# Patient Record
Sex: Male | Born: 1950 | Race: Black or African American | Hispanic: No | Marital: Married | Smoking: Never smoker
Health system: Southern US, Community
[De-identification: ages and names within clinical notes are randomized; demographics above are authoritative.]

## PROBLEM LIST (undated history)

## (undated) DIAGNOSIS — C801 Malignant (primary) neoplasm, unspecified: Secondary | ICD-10-CM

## (undated) DIAGNOSIS — I10 Essential (primary) hypertension: Secondary | ICD-10-CM

## (undated) DIAGNOSIS — Z8489 Family history of other specified conditions: Secondary | ICD-10-CM

## (undated) DIAGNOSIS — C20 Malignant neoplasm of rectum: Secondary | ICD-10-CM

## (undated) HISTORY — PX: ANKLE SURGERY: SHX546

## (undated) HISTORY — PX: HERNIA REPAIR: SHX51

## (undated) MED FILL — Dexamethasone Sodium Phosphate Inj 100 MG/10ML: INTRAMUSCULAR | Qty: 1 | Status: AC

---

## 2019-09-20 HISTORY — PX: RECTAL BIOPSY: SHX2303

## 2020-08-11 ENCOUNTER — Other Ambulatory Visit (HOSPITAL_COMMUNITY): Payer: Self-pay | Admitting: General Surgery

## 2020-08-11 ENCOUNTER — Other Ambulatory Visit: Payer: Self-pay | Admitting: General Surgery

## 2020-08-11 DIAGNOSIS — K6289 Other specified diseases of anus and rectum: Secondary | ICD-10-CM

## 2020-08-11 DIAGNOSIS — C20 Malignant neoplasm of rectum: Secondary | ICD-10-CM

## 2020-08-11 NOTE — Progress Notes (Signed)
Spoke with patient regarding referral we received from Dr. Marcello Moores at [facility] for newly diagnoised anal adenocarcinoma.  He is having CT and MRI done on Monday 11/29 and I have scheduled him with Dr. Benay Spice on Thursday 08/20/2020 at 2 pm to arrive no later than 1:45 for registration.  I have given him our physical address and informed him he may bring one person with him for his consult.  He verbalized an understanding.

## 2020-08-12 ENCOUNTER — Telehealth: Payer: Self-pay | Admitting: Radiation Oncology

## 2020-08-12 NOTE — Telephone Encounter (Signed)
Shay faxed a request to [facility] yesterday asking for path/imaging records. I have followed up with a voicemail to [facility] to get these records.

## 2020-08-17 ENCOUNTER — Ambulatory Visit (HOSPITAL_COMMUNITY)
Admission: RE | Admit: 2020-08-17 | Discharge: 2020-08-17 | Disposition: A | Discharge status: Home / Self Care | Payer: Medicare Other | Source: Ambulatory Visit | Attending: General Surgery | Admitting: General Surgery

## 2020-08-17 ENCOUNTER — Encounter (HOSPITAL_COMMUNITY): Payer: Self-pay

## 2020-08-17 ENCOUNTER — Other Ambulatory Visit: Payer: Self-pay

## 2020-08-17 ENCOUNTER — Other Ambulatory Visit: Payer: Self-pay | Admitting: Nephrology

## 2020-08-17 ENCOUNTER — Other Ambulatory Visit (HOSPITAL_COMMUNITY): Payer: Self-pay | Admitting: General Surgery

## 2020-08-17 DIAGNOSIS — K6289 Other specified diseases of anus and rectum: Secondary | ICD-10-CM | POA: Diagnosis present

## 2020-08-17 DIAGNOSIS — C20 Malignant neoplasm of rectum: Secondary | ICD-10-CM | POA: Diagnosis present

## 2020-08-17 DIAGNOSIS — N1832 Chronic kidney disease, stage 3b: Secondary | ICD-10-CM

## 2020-08-17 HISTORY — DX: Essential (primary) hypertension: I10

## 2020-08-17 HISTORY — DX: Malignant (primary) neoplasm, unspecified: C80.1

## 2020-08-17 HISTORY — DX: Malignant neoplasm of rectum: C20

## 2020-08-17 LAB — POCT I-STAT CREATININE: Creatinine, Ser: 2.7 mg/dL — ABNORMAL HIGH (ref 0.61–1.24)

## 2020-08-17 NOTE — Progress Notes (Signed)
Pt too claustrophobic for MRI of the pelvis. Pt is too reach out to his physician and see about getting meds to reschedule MRI if warrented. Pt going for his CT after he left the department. Pt verbalized understanding of the next steps to be taken to be able to attempt mri once more.

## 2020-08-20 ENCOUNTER — Telehealth: Payer: Self-pay | Admitting: Oncology

## 2020-08-20 ENCOUNTER — Other Ambulatory Visit: Payer: Self-pay

## 2020-08-20 ENCOUNTER — Inpatient Hospital Stay: Payer: Medicare Other | Attending: Oncology | Admitting: Oncology

## 2020-08-20 VITALS — BP 156/79 | HR 99 | Temp 96.9°F | Resp 18 | Ht 69.0 in | Wt 204.2 lb

## 2020-08-20 DIAGNOSIS — C20 Malignant neoplasm of rectum: Secondary | ICD-10-CM | POA: Diagnosis not present

## 2020-08-20 DIAGNOSIS — G4730 Sleep apnea, unspecified: Secondary | ICD-10-CM | POA: Insufficient documentation

## 2020-08-20 DIAGNOSIS — N289 Disorder of kidney and ureter, unspecified: Secondary | ICD-10-CM | POA: Insufficient documentation

## 2020-08-20 DIAGNOSIS — K439 Ventral hernia without obstruction or gangrene: Secondary | ICD-10-CM | POA: Insufficient documentation

## 2020-08-20 DIAGNOSIS — Z86010 Personal history of colonic polyps: Secondary | ICD-10-CM | POA: Insufficient documentation

## 2020-08-20 DIAGNOSIS — Z87442 Personal history of urinary calculi: Secondary | ICD-10-CM | POA: Insufficient documentation

## 2020-08-20 DIAGNOSIS — R911 Solitary pulmonary nodule: Secondary | ICD-10-CM | POA: Insufficient documentation

## 2020-08-20 DIAGNOSIS — K7689 Other specified diseases of liver: Secondary | ICD-10-CM | POA: Insufficient documentation

## 2020-08-20 DIAGNOSIS — Z5111 Encounter for antineoplastic chemotherapy: Secondary | ICD-10-CM | POA: Insufficient documentation

## 2020-08-20 DIAGNOSIS — Z8547 Personal history of malignant neoplasm of testis: Secondary | ICD-10-CM | POA: Insufficient documentation

## 2020-08-20 DIAGNOSIS — Z923 Personal history of irradiation: Secondary | ICD-10-CM | POA: Insufficient documentation

## 2020-08-20 DIAGNOSIS — I10 Essential (primary) hypertension: Secondary | ICD-10-CM | POA: Insufficient documentation

## 2020-08-20 NOTE — Progress Notes (Signed)
[facility] New Patient Consult   Requesting MD: Leighton Ruff, Md [address] [place],  [place] [address]   Walter [name] 69 y.o.  April 22, 1951    Reason for Consult: Rectal cancer   HPI: Walter [name] reports rectal bleeding for the past year.  He was referred to Dr. Alan Ripper at [facility] and underwent a colonoscopy on 06/29/2020.  "Nodularity "of the rectum was noted on digital exam.  Polyps were removed from the colon and rectum.  The pathology revealed benign colonic mucosa at the cecum polypectomy, a benign serrated polyp at the cecum, a tubular adenoma of the ascending colon, a tubular adenoma at the descending colon, and a tubulovillous adenoma with high-grade dysplasia involving the rectal polypectomy.  Margin involvement could not be excluded at the rectal polypectomy.  He was referred to Dr. Marcello Moores.  A 75% circumferential perianal mass with involvement of the left posterior anal canal and fixed to underlying tissue was noted.  A biopsy was performed.  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  The tumor is positive for CDX2 and cytokeratin 20.  CTs of the chest, abdomen, and pelvis on 08/17/2020 revealed a ground glass left upper lobe nodule and a 3 mm right middle lobe nodule.  There are liver cysts and too small to characterize liver lesions.  Left renal atrophy.  Ventral abdominal hernia containing colon.  Rectal mass not identified.  No abdominal pelvic adenopathy.    Past Medical History:  Diagnosis Date  . Hypertension   . 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 testicular ca-left    XRT comp    .  Sleep apnea   .  History of kidney stones  Past surgical history:   .  Umbilical hernia repair   .  Left orchiectomy Medications: Reviewed  Allergies: Not on File  Family history: His mother had "throat "cancer.  A sister had stomach cancer.  His brother had "cancer ".  Social History:   He  lives with his wife in [place].  He has 5 children.  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  No transfusion history.  No risk factor for HIV or hepatitis.  ROS:   Positives include: Intermittent rectal bleeding, "small "bowel movements, discomfort at the rectum, palpable "nodule "at the rectum  A complete ROS was otherwise negative.  Physical Exam:  Blood pressure (!) 156/79, pulse 99, temperature (!) 96.9 F (36.1 C), temperature source Tympanic, resp. rate 18, height 5\' 9"  (1.753 m), weight 204 lb 3.2 oz (92.6 kg), SpO2 99 %.  HEENT: Neck without mass Lungs: Clear bilaterally Cardiac: Regular rate and rhythm Abdomen: No hepatosplenomegaly, ventral hernia, nontender, no mass GU: Right testicle without mass Vascular: No leg edema Lymph nodes: No cervical, supraclavicular, axillary, or inguinal nodes Neurologic: Alert and oriented, the motor exam appears intact in the upper and lower extremities bilaterally Skin: No rash Musculoskeletal: No spine tenderness  Rectal: Firm tumor at the anal verge extending proximally to the end of the examination finger, centered to the left of midline but nearly completely circumferential   LAB:  CBC: 04/27/2020 Hemoglobin 12.8, platelets 258,000, white count 6.5, platelets 258,000, 50.9% neutrophils     CMP  Lab Results  Component Value Date   CREATININE 2.70 (H) 08/17/2020   04/27/2020: BUN 22, creatinine 2.2, albumin 3.5, bilirubin 0.409, alkaline phosphatase 74, AST 17, ALT 15    Imaging:  As per HPI  Assessment/Plan:   1. Rectal cancer  Rectal nodularity noted on colonoscopy 06/29/2020, removal of a rectal "polyp "revealed a tubulovillous adenoma with high-grade dysplasia  Physical exam 08/04/2020-perianal mass invading the anal canal and sphincter complex-biopsy positive for mucinous adenocarcinoma  CTs 08/17/2020-no rectal mass identified, multiple liver cyst and too small to characterize  low-attenuation liver lesions, midline ventral hernia containing proximal right colon, groundglass left upper lobe nodule and nonspecific 3 mm right middle lobe nodule 2. Renal insufficiency 3. 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 Hypertension 5. History of kidney stones 6. Sleep apnea 7. Umbilical hernia repair 8. Multiple polyps removed on colonoscopy 06/29/2020 including a benign serrated polyp, tubular adenomas, and a tubulovillous adenoma with high-grade dysplasia at the rectum   Disposition:   Walter [name] has been diagnosed with rectal cancer.  He appears to have tumor involving the rectum, anal canal, and anal margin.  Staging CT scans revealed no clear evidence of metastatic disease.  I discussed treatment options with Walter [name].  He understands surgery will require an APR and permanent colostomy.  He is scheduled for a staging pelvic MRI on 08/25/2020.  He will return for an office visit and further discussion on 08/27/2020.  We will follow up on records regarding the history of testicular cancer.  He reports receiving radiation which may impact on his ability to receive neoadjuvant radiation.  He is scheduled to see Dr. Lisbeth Renshaw on 08/26/2020.  His case will be presented at the GI tumor conference.  I anticipate we will recommend neoadjuvant infusional 5-fluorouracil and radiation if the MRI does not confirm node positive disease.  Betsy Coder, MD  08/20/2020, 2:27 PM

## 2020-08-20 NOTE — Telephone Encounter (Signed)
Scheduled appointment per 12/2 los. Spoke to patient who is aware of appointment date and time. Gave patient calendar print out.  °

## 2020-08-21 ENCOUNTER — Encounter: Payer: Self-pay | Admitting: *Deleted

## 2020-08-21 NOTE — Progress Notes (Signed)
Met with patient and his wife prior to their initial medical oncology consult with Dr. Brad Sherrill.  I explained my role as nurse navigator and they were given my card with my direct contact information.  Per Dr. Sherrill's request I had patient sign a medical release of information and faxed this to [facility] in [place], [place] in attempt to obtain previous records related to his testicular cancer approximately 20 years ago including dosimetry records.  I received a fax back from them stating their records only go back 10 years.  I have made Dr. Sherrill and RT aware.  

## 2020-08-21 NOTE — Progress Notes (Signed)
Per Dr. Benay Spice request: Email to [facility] requesting assistance in getting IHC testing done on [place] case #[id number] dated 08/04/20 (anal biopsy).

## 2020-08-24 ENCOUNTER — Ambulatory Visit
Admission: RE | Admit: 2020-08-24 | Discharge: 2020-08-24 | Disposition: A | Discharge status: Home / Self Care | Payer: Medicare Other | Source: Ambulatory Visit | Attending: General Surgery | Admitting: General Surgery

## 2020-08-24 ENCOUNTER — Other Ambulatory Visit: Payer: Self-pay

## 2020-08-24 DIAGNOSIS — K6289 Other specified diseases of anus and rectum: Secondary | ICD-10-CM

## 2020-08-24 DIAGNOSIS — C20 Malignant neoplasm of rectum: Secondary | ICD-10-CM

## 2020-08-25 ENCOUNTER — Other Ambulatory Visit: Payer: Medicare Other

## 2020-08-25 NOTE — Progress Notes (Signed)
GI Location of Tumor / Histology: Rectal Cancer- Adenocarcinoma  Walter [name] presented with rectal bleeding.  CT CAP 08/17/2020: Ground glass left upper lobe nodule and a 3 mm right middle lobe nodule.  There are liver cysts and too small to characterize liver lesions.  Left renal atrophy.  Ventral abdominal hernia containing colon.  Rectal mass not identified.  No abdominal pelvic adenopathy.  Colonoscopy 06/29/2020:   Biopsies of Rectal Mass    Past/Anticipated interventions by surgeon, if any:  Dr. Marcello Moores 08/04/2020 -On exam today, this appears to be malignant in nature with an infiltrative mass involving approximately 75% of the perianal region and invading into the anal canal and sphincter complex. -I performed biopsy today under local anesthesia.  Differential diagnosis includes Paget's Disease and anal squamous cell carcinoma. 08/10/2020 -Surgical pathologist called and stated punch biopsy appears to be rectal adenocarcinoma. -We will notify the patient and refer to radiation and oncology for treatment.   Past/Anticipated interventions by medical oncology, if any:  Dr. Benay Spice 08/20/2020 -Walter [name] has been diagnosed with rectal cancer.  He appears to have tumor involving the rectum, anal canal, and anal margin.  Staging CT scans revealed no clear evidence of metastatic disease. -I discussed treatment options with Walter [name].  He understands surgery will require an APR and permanent colostomy.  He is scheduled for a staging pelvic MRI on 08/25/2020.  He will return for an office visit and further discussion on 08/27/2020. -Follow-up appointment 08/27/2020 1045   Weight changes, if any: No  Bowel/Bladder complaints, if any: Passing small nuggets, no real constipation noted.  He has baseline urinary frequency.  Nausea / Vomiting, if any: No  Pain issues, if any: Has some pain with stools.  Any blood per rectum: Has occasional, typically rust color.    SAFETY  ISSUES:  Prior radiation? Left Orchiectomy and radiation for testicular Cancer 1990's, in [place]  Pacemaker/ICD? No  Possible current pregnancy? n/a  Is the patient on methotrexate? No  Current Complaints/Details:

## 2020-08-26 ENCOUNTER — Ambulatory Visit
Admission: RE | Admit: 2020-08-26 | Discharge: 2020-08-26 | Disposition: A | Discharge status: Home / Self Care | Payer: Medicare Other | Source: Ambulatory Visit | Attending: Radiation Oncology | Admitting: Radiation Oncology

## 2020-08-26 ENCOUNTER — Encounter: Payer: Self-pay | Admitting: Radiation Oncology

## 2020-08-26 ENCOUNTER — Other Ambulatory Visit: Payer: Self-pay

## 2020-08-26 ENCOUNTER — Encounter: Payer: Self-pay | Admitting: General Practice

## 2020-08-26 VITALS — BP 153/78 | HR 88 | Temp 97.9°F | Resp 19 | Ht 69.0 in | Wt 204.4 lb

## 2020-08-26 DIAGNOSIS — C20 Malignant neoplasm of rectum: Secondary | ICD-10-CM

## 2020-08-26 DIAGNOSIS — R911 Solitary pulmonary nodule: Secondary | ICD-10-CM | POA: Insufficient documentation

## 2020-08-26 DIAGNOSIS — Z8547 Personal history of malignant neoplasm of testis: Secondary | ICD-10-CM | POA: Diagnosis not present

## 2020-08-26 DIAGNOSIS — I10 Essential (primary) hypertension: Secondary | ICD-10-CM | POA: Diagnosis not present

## 2020-08-26 DIAGNOSIS — Z7982 Long term (current) use of aspirin: Secondary | ICD-10-CM | POA: Diagnosis not present

## 2020-08-26 DIAGNOSIS — K439 Ventral hernia without obstruction or gangrene: Secondary | ICD-10-CM | POA: Diagnosis not present

## 2020-08-26 DIAGNOSIS — N400 Enlarged prostate without lower urinary tract symptoms: Secondary | ICD-10-CM | POA: Insufficient documentation

## 2020-08-26 DIAGNOSIS — Z79899 Other long term (current) drug therapy: Secondary | ICD-10-CM | POA: Insufficient documentation

## 2020-08-26 DIAGNOSIS — I700 Atherosclerosis of aorta: Secondary | ICD-10-CM | POA: Insufficient documentation

## 2020-08-26 DIAGNOSIS — K7689 Other specified diseases of liver: Secondary | ICD-10-CM | POA: Diagnosis not present

## 2020-08-26 DIAGNOSIS — Z923 Personal history of irradiation: Secondary | ICD-10-CM | POA: Insufficient documentation

## 2020-08-26 NOTE — Progress Notes (Signed)
[facility] Clinical Social Work  Initial Assessment   Walter [name] is a 69 y.o. year old male contacted by phone.. Clinical Social Work was referred by Distress Screen for assessment of psychosocial needs.   SDOH (Social Determinants of Health) assessments performed: Yes SDOH Interventions     Most Recent Value  SDOH Interventions  Food Insecurity Interventions Intervention Not Indicated  Financial Strain Interventions Intervention Not Indicated  Housing Interventions Intervention Not Indicated  Social Connections Interventions Intervention Not Indicated  Transportation Interventions Intervention Not Indicated      Distress Screen completed: Yes ONCBCN DISTRESS SCREENING 08/26/2020  Screening Type Initial Screening  Distress experienced in past week (1-10) 8  Emotional problem type Nervousness/Anxiety  Information Concerns Type Lack of info about diagnosis;Lack of info about treatment  Referral to clinical psychology -  Referral to clinical social work -  Referral to dietition -  Referral to financial advocate -  Referral to support programs -  Referral to palliative care -  Other Contact via phone      Family/Social Information:  . Housing Arrangement: patient lives with wife, moved to this area in June 2021 . Family members/support persons in your life? Family all live in [place], son lives here but he travels quite a bit.  Somewhat isolated, has not established connections with church or community groups like he had in [place] . Transportation concerns: No, wife will drive . Employment: Retired.  . Income source: retirement income . Financial concerns: No o Type of concern: None . Food access concerns: No . Religious or spiritual practice: Retired Theme park manager, looking to join [employer] but has not found right place yet. . Medication Concerns: No . Services Currently in place:  none  Coping/ Adjustment to diagnosis: . Patient understands treatment plan and what happens next?  Newly diagnosed with rectal cancer after having irregularities in his colonoscopy.  History of testicular cancer in the past.  Will have radiation and chemotherapy.   . Concerns about diagnosis and/or treatment: I'm not especially worried about anything . Patient reported stressors: new to this area, does not have much support system here but is in frequent contact w relatives in [place] . Hopes and priorities: finishing treatment . Current coping skills/ strengths: Ability for insight, Average or above average intelligence, Capable of independent living, Communication skills, Financial means, General fund of knowledge, Religious Affiliation and Supportive family/friends    SUMMARY: Current SDOH Barriers:  . None  Interventions: . Discussed common feeling and emotions when being diagnosed with cancer, and the importance of support during treatment . Informed patient of the support team roles and support services at [facility] . Provided CSW contact information and encouraged patient to call with any questions or concerns . Provided patient with information about [place] and services available to him/spouse   Follow Up Plan: Patient will contact CSW with any support or resource needs Patient verbalizes understanding of plan: Yes    Beverely Pace , LCSW

## 2020-08-26 NOTE — Progress Notes (Signed)
Radiation Oncology         [phone] ________________________________  Name: Walter [name]        MRN: [id number]  Date of Service: 08/26/2020 DOB: 14-Dec-1950  BT:DHRCBUL, Provider, MD  Leighton Ruff, MD     REFERRING PHYSICIAN: Leighton Ruff, MD   DIAGNOSIS: The encounter diagnosis was Rectal adenocarcinoma [facility]).   HISTORY OF PRESENT ILLNESS: Walter [name] is a 69 y.o. male seen at the request of Dr.Sherril for a newly diagnosed carcinoma of the rectum. The patient was experiencing rectal bleeding intermittently for the last year, he met with his provider at [facility] and underwent a colonoscopy on 06/29/2020. The notes indicate that there was a nodularity of the rectum by digital examination as well as multiple polyps, cecal polypectomy showed a benign serrated polyp, tubular adenoma was seen in the ascending colon and descending colon as well as a tubular visible villous adenoma with high-grade dysplasia involving the rectal polypectomy margin involved could not be excluded by rectal polypectomy and he met with Dr. Marcello Moores. By exam a 75% circumferential perianal mass with involvement of the left posterior anal canal was noted and there was fixed appearing changes of the underlying tissue to the mass. 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 He also underwent a CT chest abdomen pelvis on 08/17/2020 which revealed no specific findings to suggest metastatic or nodal disease in the chest abdomen and pelvis, multiple lobe liver cysts, a large abdominal wall hernia containing the proximal right colon, and a 7 mm left upper lobe nodule and right middle lobe nodule measuring 3 mm were all identified along with stigmata of atherosclerotic disease. He underwent attempt at MRI of the pelvis but could not proceed with this due to severe claustrophobia. He's seen today to discuss options of treatment for his cancer. Dr. Learta Codding anticipates 5FU  chemoRT. 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     PREVIOUS RADIATION THERAPY: Yes   May 1991: It appears from handwritten notes that in [place] he received 27 Gy in 15 fractions to the pelvic and paraaortic lymph nodes.   PAST MEDICAL HISTORY:  Past Medical History:  Diagnosis Date  . Hypertension   . Rectal cancer ([facility] [place]) dx'd 07/2020  . testicular ca    XRT comp       PAST SURGICAL HISTORY: Past Surgical History:  Procedure Laterality Date  . RECTAL BIOPSY  2021     FAMILY HISTORY: No family history on file.   SOCIAL HISTORY:  The patient is married and lives in [place]. He's originally from [place]. He and his wife moved here to be closer to family. 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   ALLERGIES: Patient has no allergy information on record.   MEDICATIONS:  Current Outpatient Medications  Medication Sig Dispense Refill  . amLODipine (NORVASC) 10 MG tablet Take 10 mg by mouth daily.    Marland Kitchen aspirin 81 MG EC tablet Take 81 mg by mouth once a week.    . chlorthalidone (HYGROTON) 25 MG tablet Take 25 mg by mouth daily.    . cyanocobalamin 1000 MCG tablet Take 1,000 mcg by mouth daily.    . Multiple Vitamin (MULTIVITAMIN) tablet Take 1 tablet by mouth daily.     No current facility-administered medications for this encounter.     REVIEW OF  SYSTEMS: On review of systems, the patient reports that he is doing well overall. He reports occasional twinges of pain in his rectum as well as firm small stool that passes. He denies any bleeding currently with bowel movements. He denies any chest pain, shortness of breath, cough, fevers, chills, night sweats, unintended weight changes, or bladder disturbances, and denies abdominal pain, nausea or vomiting. No other complaints are noted.     PHYSICAL EXAM:  Wt Readings from Last 3  Encounters:  08/26/20 204 lb 6 oz (92.7 kg)  08/20/20 204 lb 3.2 oz (92.6 kg)   Temp Readings from Last 3 Encounters:  08/26/20 97.9 F (36.6 C) (Temporal)  08/20/20 (!) 96.9 F (36.1 C) (Tympanic)   BP Readings from Last 3 Encounters:  08/26/20 (!) 153/78  08/20/20 (!) 156/79   Pulse Readings from Last 3 Encounters:  08/26/20 88  08/20/20 99   Pain Assessment Pain Score: 0-No pain/10  In general this is a well appearing African American male in no acute distress. He's alert and oriented x4 and appropriate throughout the examination. Cardiopulmonary assessment is negative for acute distress and he exhibits normal effort.     ECOG = 1  0 - Asymptomatic (Fully active, able to carry on all predisease activities without restriction)  1 - Symptomatic but completely ambulatory (Restricted in physically strenuous activity but ambulatory and able to carry out work of a light or sedentary nature. For example, light housework, office work)  2 - Symptomatic, <50% in bed during the day (Ambulatory and capable of all self care but unable to carry out any work activities. Up and about more than 50% of waking hours)  3 - Symptomatic, >50% in bed, but not bedbound (Capable of only limited self-care, confined to bed or chair 50% or more of waking hours)  4 - Bedbound (Completely disabled. Cannot carry on any self-care. Totally confined to bed or chair)  5 - Death   Walter [name] MM, Creech RH, Tormey DC, et al. [phone]). "Toxicity and response criteria of the [facility]". [place] Oncol. 5 (6): 649-55    LABORATORY DATA:  No results found for: WBC, HGB, HCT, MCV, PLT No results found for: NA, K, CL, CO2 No results found for: ALT, AST, GGT, ALKPHOS, BILITOT    RADIOGRAPHY: CT CHEST ABDOMEN PELVIS WO CONTRAST  Result Date: 08/17/2020 CLINICAL DATA:  Rectal adenocarcinoma. Staging. Remote history of left testicular carcinoma. EXAM: CT CHEST, ABDOMEN AND PELVIS WITHOUT  CONTRAST TECHNIQUE: Multidetector CT imaging of the chest, abdomen and pelvis was performed following the standard protocol without IV contrast. COMPARISON:  None FINDINGS: CT CHEST FINDINGS Cardiovascular: The heart size appears within normal limits. There is no pericardial effusion identified. Aortic atherosclerosis. Mediastinum/Nodes: No enlarged mediastinal, hilar, or axillary lymph nodes. Thyroid gland, trachea, and esophagus demonstrate no significant findings. Lungs/Pleura: No pleural effusion. Asymmetric elevation of left hemidiaphragm. Scar noted within the anterior left lower lobe. Ground-glass attenuating nodule in the left upper lobe is best seen on coronal image 73/4. This measures 0.7 x 0.6 cm. 3 mm right middle lobe lung nodule is noted, image 131/6. Musculoskeletal: No chest wall mass or suspicious bone lesions identified. CT ABDOMEN PELVIS FINDINGS Hepatobiliary: Multiple liver cysts are identified. The largest arises from the posteromedial right hepatic lobe measuring 2.8 cm, image 74/2. Additionally, there are several scattered, ill-defined foci which are either too small to reliably characterize by CT. The gallbladder appears within normal limits. No signs of bile duct dilatation.  Pancreas: Unremarkable. No pancreatic ductal dilatation or surrounding inflammatory changes. Spleen: Normal in size without focal abnormality. Adrenals/Urinary Tract: Normal appearance of the adrenal glands. Mild asymmetric left renal atrophy. 3.4 cm cyst arises from upper pole of the right kidney. This is incompletely characterized without IV contrast. No kidney stone or hydronephrosis identified bilaterally. Several diverticula are noted arising off the anterior bladder wall, image 110/2. Bladder is otherwise unremarkable. Stomach/Bowel: Stomach is nondistended no small bowel wall thickening, inflammation, or distension. There is a large, midline ventral abdominal wall hernia which contains the proximal right colon.  This measures 8 cm in transverse dimension. No signs of bowel obstruction. Rectal mass is difficult to identified reflecting lack of IV and enteric contrast material. Vascular/Lymphatic: Aortic atherosclerosis. No aneurysm. No abdominopelvic adenopathy. Reproductive: Mild prostate gland enlargement. Other: No free fluid or fluid collections within the abdomen or pelvis. No peritoneal nodularity. Musculoskeletal: No acute or significant osseous findings. IMPRESSION: 1. Rectal mass is difficult to identified reflecting lack of IV and enteric contrast material. No specific findings identified to suggest solid organ metastasis or nodal metastasis within the chest, abdomen or pelvis. 2. Multiple liver cysts. Additionally, there are scattered, low attenuation within the liver which are too small to reliably characterize by CT. More definitive characterization the obtained with contrast enhanced MRI liver. Alternatively recommend attention on follow-up imaging is advised. 3. Large, midline ventral abdominal wall hernia which contains the proximal right colon. No signs of bowel obstruction. 4. Ground-glass attenuating nodule in the left upper lobe measures 0.7 x 0.6 cm. Initial follow-up with CT at 6-12 months is recommended to confirm persistence. If persistent, repeat CT is recommended every 2 years until 5 years of stability has been established. This recommendation follows the consensus statement: Guidelines for Management of Incidental Pulmonary Nodules Detected on CT Images: From the Fleischner Society 2017; Radiology 2017; 284:228-243. 5. 3 mm, nonspecific solid nodule is identified within the right middle lobe. Attention on follow-up imaging is advised. 6. Aortic atherosclerosis. Aortic Atherosclerosis (ICD10-I70.0). Electronically Signed   By: Kerby Moors M.D.   On: 08/17/2020 14:05       IMPRESSION/PLAN: 1. 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 Dr. Lisbeth Renshaw discusses the pathology  findings and reviews the nature of adenocarcinomas of the lower GI tract. He discusses the rationale for chemoRT and probable intentions for surgical resection as well. Dr. Marcello Moores will clarify her plans with the patient as well and his case will be presented in the next GI oncology conference. Unfortunately he was not able to tolerate an MRI, and it is not felt that endoscopic ultrasound would be feasible, per Dr. Benay Spice. Rather, Dr. Lisbeth Renshaw favors treating him with fields similar to anal carcinoma treatment along with chemosensitization. We discussed the risks, benefits, short, and long term effects of radiotherapy, as well as the curative intent, and the patient is interested in proceeding. Dr. Lisbeth Renshaw discusses the delivery and logistics of radiotherapy and anticipates a course of 6 weeks of radiotherapy. He will simulate next Monday and we anticipate starting treatment on 09/07/20.  In a visit lasting 75 minutes, greater than 50% of the time was spent face to face discussing the patient's condition, in preparation for the discussion, and coordinating the patient's care.  The above documentation reflects my direct findings during this shared patient visit. Please see the separate note by Dr. Lisbeth Renshaw on this date for the remainder of the patient's plan of care.    Carola Rhine, PAC

## 2020-08-27 ENCOUNTER — Inpatient Hospital Stay (HOSPITAL_BASED_OUTPATIENT_CLINIC_OR_DEPARTMENT_OTHER): Payer: Medicare Other | Admitting: Oncology

## 2020-08-27 ENCOUNTER — Telehealth: Payer: Self-pay

## 2020-08-27 ENCOUNTER — Other Ambulatory Visit: Payer: Self-pay

## 2020-08-27 VITALS — BP 146/79 | HR 90 | Temp 98.1°F | Resp 19 | Ht 69.0 in | Wt 203.2 lb

## 2020-08-27 DIAGNOSIS — Z87442 Personal history of urinary calculi: Secondary | ICD-10-CM | POA: Diagnosis not present

## 2020-08-27 DIAGNOSIS — Z8547 Personal history of malignant neoplasm of testis: Secondary | ICD-10-CM | POA: Diagnosis not present

## 2020-08-27 DIAGNOSIS — Z86010 Personal history of colonic polyps: Secondary | ICD-10-CM | POA: Diagnosis not present

## 2020-08-27 DIAGNOSIS — C20 Malignant neoplasm of rectum: Secondary | ICD-10-CM | POA: Diagnosis not present

## 2020-08-27 DIAGNOSIS — K7689 Other specified diseases of liver: Secondary | ICD-10-CM | POA: Diagnosis not present

## 2020-08-27 DIAGNOSIS — Z5111 Encounter for antineoplastic chemotherapy: Secondary | ICD-10-CM | POA: Diagnosis present

## 2020-08-27 DIAGNOSIS — R911 Solitary pulmonary nodule: Secondary | ICD-10-CM | POA: Diagnosis not present

## 2020-08-27 DIAGNOSIS — G4730 Sleep apnea, unspecified: Secondary | ICD-10-CM | POA: Diagnosis not present

## 2020-08-27 DIAGNOSIS — Z923 Personal history of irradiation: Secondary | ICD-10-CM | POA: Diagnosis not present

## 2020-08-27 DIAGNOSIS — K439 Ventral hernia without obstruction or gangrene: Secondary | ICD-10-CM | POA: Diagnosis not present

## 2020-08-27 DIAGNOSIS — N289 Disorder of kidney and ureter, unspecified: Secondary | ICD-10-CM | POA: Diagnosis not present

## 2020-08-27 DIAGNOSIS — I10 Essential (primary) hypertension: Secondary | ICD-10-CM | POA: Diagnosis not present

## 2020-08-27 NOTE — Telephone Encounter (Signed)
Left voice message for patient that Dr. Marcello Moores agreed with Dr. Benay Spice refer to Dr. Morton Stall at [facility]. He will call him tomorrow.  Also radiation oncology is in agreement as well.  He can proceed with simulation however start will depend on his surgical consult.

## 2020-08-27 NOTE — Progress Notes (Signed)
  Walter [name]   Diagnosis: Rectal cancer  INTERVAL HISTORY:   Walter [name] returns as scheduled.  He is having bowel movements.  No new complaint.   CMP  Lab Results  Component Value Date   CREATININE 2.70 (H) 08/17/2020    Medications: I have reviewed the patient's current medications.   Assessment/Plan: 1. Rectal cancer  Rectal nodularity noted on colonoscopy 06/29/2020, removal of a rectal "polyp "revealed a tubulovillous adenoma with high-grade dysplasia  Physical exam 08/04/2020-perianal mass invading the anal canal and sphincter complex-biopsy positive for mucinous adenocarcinoma  CTs 08/17/2020-no rectal mass identified, multiple liver cyst and too small to characterize low-attenuation liver lesions, midline ventral hernia containing proximal right colon, groundglass left upper lobe nodule and nonspecific 3 mm right middle lobe nodule 2. Renal insufficiency 3. 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 Hypertension 5. History of kidney stones 6. Sleep apnea 7. Umbilical hernia repair 8. Multiple polyps removed on colonoscopy 06/29/2020 including a benign serrated polyp, tubular adenomas, and a tubulovillous adenoma with high-grade dysplasia at the rectum    Disposition: Walter [name] has been diagnosed with locally advanced rectal cancer.  We had a long discussion regarding treatment options for the rectal cancer.  He is unable to undergo a staging pelvic MRI.  He is scheduled to undergo radiation simulation early next week.  I her initial plan was to begin infusional 5-FU and concurrent radiation within the next 2 weeks.  Walter [name] indicates he does not wish to undergo an APR with a colostomy.  He understands the very small chance of a complete clinical remission with neoadjuvant therapy.  Dr. Marcello Moores plans to refer him out of town for surgery.  He may be a candidate for  total neoadjuvant therapy.  I plan to discuss this with Dr. Marcello Moores and Dr. Lisbeth Renshaw.  We will make a decision on a second surgical opinion prior to initiating neoadjuvant therapy.  Walter [name] understands the complexity of his case given the previous pelvic radiation, locally advanced nature of the tumor, and his renal insufficiency.  A decision will be made on total neoadjuvant therapy versus 5-FU/radiation when he is evaluated for a second surgical opinion.  Betsy Coder, MD  08/27/2020  11:23 AM

## 2020-08-27 NOTE — Progress Notes (Signed)
Reports anxiety--has spoken with CSW, Webb Silversmith. Offered to contact chaplain whenever he wishes, just call nurse.

## 2020-08-28 ENCOUNTER — Other Ambulatory Visit: Payer: Self-pay | Admitting: *Deleted

## 2020-08-28 DIAGNOSIS — C20 Malignant neoplasm of rectum: Secondary | ICD-10-CM

## 2020-08-28 MED ORDER — PROCHLORPERAZINE MALEATE 10 MG PO TABS: 10.0000 mg | ORAL_TABLET | Freq: Four times a day (QID) | ORAL | 0 refills | Status: DC | PRN

## 2020-08-28 NOTE — Progress Notes (Signed)
Faxed referral order, demographics and chart information to Dr. Lenise Arena w/[facility] after Dr. Benay Spice spoke w/Dr. Morton Stall. Faxed #[phone]. Callled and left VM at 10:56 ([phone]) that records sent. Requested radiology to push over CT of 08/17/20 to [facility]. Patient notified. He states he had wave of N/V last night. Better today. Informed him a script for compazine will be sent in for him.

## 2020-08-31 ENCOUNTER — Telehealth: Payer: Self-pay | Admitting: *Deleted

## 2020-08-31 ENCOUNTER — Ambulatory Visit: Payer: Medicare Other | Admitting: Radiation Oncology

## 2020-08-31 ENCOUNTER — Telehealth: Payer: Self-pay | Admitting: Radiation Oncology

## 2020-08-31 ENCOUNTER — Other Ambulatory Visit: Payer: Self-pay | Admitting: Oncology

## 2020-08-31 DIAGNOSIS — C20 Malignant neoplasm of rectum: Secondary | ICD-10-CM

## 2020-08-31 NOTE — Progress Notes (Signed)
START ON PATHWAY REGIMEN - Colorectal     A cycle is every 14 days:     Oxaliplatin      Leucovorin      Fluorouracil      Fluorouracil   **Always confirm dose/schedule in your pharmacy ordering system**  Patient Characteristics: Preoperative or Nonsurgical Candidate (Clinical Staging), Rectal, cT3 - cT4, cN0 or Any cT, cN+ Tumor Location: Rectal Therapeutic Status: Preoperative or Nonsurgical Candidate (Clinical Staging) AJCC T Category: Staged < 8th Ed. AJCC N Category: Staged < 8th Ed. AJCC M Category: Staged < 8th Ed. AJCC 8 Stage Grouping: Staged < 8th Ed. Intent of Therapy: Curative Intent, Discussed with Patient

## 2020-08-31 NOTE — Telephone Encounter (Signed)
I returned the patients call but had to leave a message encouraging him to call back if he'd like to chat with [place].

## 2020-08-31 NOTE — Telephone Encounter (Signed)
Call from GI Coordinator that she has attempted to reach patient without success. May have appointment for him on 09/03/20. Called patient and provided him the phone [phone] to call to make his appointment. Final treatment plan is based on surgical opinion. He says he will call now.

## 2020-09-01 ENCOUNTER — Telehealth: Payer: Self-pay | Admitting: *Deleted

## 2020-09-01 NOTE — Telephone Encounter (Signed)
Called patient to confirm he is seeing Dr. Morton Stall at [facility]. He reports he has appointment there on 09/03/20. Agrees to allow RN to work on his port placement and chemo education class. Left message for IR re: need for port week of 12/20 (12-21 to 12/23).

## 2020-09-02 ENCOUNTER — Other Ambulatory Visit: Payer: Self-pay

## 2020-09-02 ENCOUNTER — Telehealth: Payer: Self-pay

## 2020-09-02 NOTE — Progress Notes (Signed)
The proposed treatment discussed in conference is for discussion purposes only and is not a binding recommendation.  The patients have not been physically examined, or presented with their treatment options.  Therefore, final treatment plans cannot be decided.   

## 2020-09-02 NOTE — Telephone Encounter (Signed)
Walter [name] with IR advised pt has been added to scheduled for 12/21 with 10a arrival.  Pt then LM stating he has been scheduled and has several questions because what "what having a port entails" has not been discussed with him at all and he now has several questions. Pt wants to speak with Manuela Schwartz, RN directly.

## 2020-09-04 ENCOUNTER — Telehealth: Payer: Self-pay | Admitting: Nurse Practitioner

## 2020-09-04 NOTE — Telephone Encounter (Signed)
Called pt to remind him about upcoming appts. Unable to reach pt. Left message for patient with appts for 12/20 times.

## 2020-09-06 ENCOUNTER — Other Ambulatory Visit: Payer: Self-pay | Admitting: Radiology

## 2020-09-07 ENCOUNTER — Inpatient Hospital Stay: Payer: Medicare Other

## 2020-09-07 ENCOUNTER — Encounter: Payer: Self-pay | Admitting: Radiology

## 2020-09-07 ENCOUNTER — Inpatient Hospital Stay (HOSPITAL_BASED_OUTPATIENT_CLINIC_OR_DEPARTMENT_OTHER): Payer: Medicare Other | Admitting: Nurse Practitioner

## 2020-09-07 ENCOUNTER — Other Ambulatory Visit: Payer: Self-pay

## 2020-09-07 ENCOUNTER — Encounter: Payer: Self-pay | Admitting: Nurse Practitioner

## 2020-09-07 ENCOUNTER — Telehealth: Payer: Self-pay | Admitting: Radiology

## 2020-09-07 VITALS — BP 157/78 | HR 96 | Temp 98.1°F | Resp 14 | Ht 69.0 in | Wt 202.6 lb

## 2020-09-07 DIAGNOSIS — C20 Malignant neoplasm of rectum: Secondary | ICD-10-CM

## 2020-09-07 DIAGNOSIS — Z5111 Encounter for antineoplastic chemotherapy: Secondary | ICD-10-CM | POA: Diagnosis not present

## 2020-09-07 MED ORDER — LIDOCAINE-PRILOCAINE 2.5-2.5 % EX CREA: TOPICAL_CREAM | CUTANEOUS | 2 refills | Status: DC

## 2020-09-07 NOTE — Progress Notes (Addendum)
  [facility] OFFICE PROGRESS NOTE   Diagnosis: Rectal cancer  INTERVAL HISTORY:   Walter [name] returns as scheduled.  Bowels are moving.  He has intermittent rectal bleeding.  He reports a good appetite.  Objective:  Vital signs in last 24 hours:  Blood pressure (!) 157/78, pulse 96, temperature 98.1 F (36.7 C), temperature source Tympanic, resp. rate 14, height 5\' 9"  (1.753 m), weight 202 lb 9.6 oz (91.9 kg), SpO2 100 %.    Resp: Lungs clear bilaterally. Cardio: Regular rate and rhythm. GI: No hepatomegaly. Vascular: No leg edema.    Lab Results:  No results found for: WBC, HGB, HCT, MCV, PLT, NEUTROABS  Imaging:  No results found.  Medications: I have reviewed the patient's current medications.  Assessment/Plan: 1. Rectal cancer ? Rectal nodularity noted on colonoscopy 06/29/2020, removal of a rectal "polyp" revealed a tubulovillous adenoma with high-grade dysplasia ? Physical exam 08/04/2020-perianal mass invading the anal canal and sphincter complex-biopsy positive for mucinous adenocarcinoma ? CTs 08/17/2020-no rectal mass identified, multiple liver cyst and too small to characterize low-attenuation liver lesions, midline ventral hernia containing proximal right colon, groundglass left upper lobe nodule and nonspecific 3 mm right middle lobe nodule 2. Renal insufficiency 3. 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 Hypertension 5. History of kidney stones 6. Sleep apnea 7. Umbilical hernia repair 8. Multiple polyps removed on colonoscopy 06/29/2020 including a benign serrated polyp, tubular adenomas, and a tubulovillous adenoma with high-grade dysplasia at the rectum   Disposition: Walter [name] appears stable.  He has seen Dr. Morton Stall.  He was referred for a pelvic MRI with sedation.  The recommendation is to proceed with total neoadjuvant therapy with FOLFOX followed by concurrent  radiation/chemotherapy then surgery.  He agrees with this approach.  We reviewed potential toxicities associated with chemotherapy including bone marrow toxicity, nausea, hair loss, allergic reaction.  We discussed the various forms of neuropathy associated with oxaliplatin including cold sensitivity, peripheral neuropathy, acute laryngopharyngeal dysesthesia and more rare occurrences such as jaw pain, diplopia, ataxia, incontinence.  We reviewed potential toxicities associated with 5-fluorouracil including mouth sores, diarrhea, hand-foot syndrome, skin rash, increased sensitivity to sun, skin hyperpigmentation.  He agrees to proceed.  He understands this regimen requires placement of a Port-A-Cath which is scheduled for tomorrow.  He will attend a chemotherapy education class later today.  He will return for the first cycle of FOLFOX on 09/15/2020.  We will see him in follow-up prior to cycle 2 on 09/29/2020.  He will contact the office in the interim with any problems.  Patient seen with Dr. Benay Spice.    Ned Card ANP/GNP-BC   09/07/2020  2:54 PM This was a shared visit with Ned Card.  Walter [name] has been diagnosed with rectal cancer.  He has been evaluated by Dr. Morton Stall.  I discussed the case with Dr. Morton Stall.  He recommends proceeding with total neoadjuvant therapy.  Walter [name] is being scheduled for a sedated staging MRI.  We discussed the rationale for total neoadjuvant therapy.  He understands the high likelihood of needing a colostomy despite total neoadjuvant therapy.  The goal of neoadjuvant therapy is to improve survival and potentially decrease surgical morbidity.  We recommend proceeding with neoadjuvant FOLFOX chemotherapy to be followed by chemotherapy and radiation.  We reviewed potential toxicities associated with the FOLFOX regimen.  He agrees to proceed.  Julieanne Manson, MD

## 2020-09-07 NOTE — Research (Signed)
[employer]ACT SCI[employer]ES 2018-01: BLOOD SAMPLE COLLECTION TO EVALUATE BIOMARKERS IN SUBJECTS WITH UNTREATED SOLID TUMORS  09/07/20     3:00PM  INITIAL VISIT: This Clinical Research Coordinator met with Tyreese Thain, [id number], on 09/07/20 in a manner and location that ensures patient privacy to discuss participation in the above listed research study.  Patient is Accompanied by his wife.  A copy of the informed consent document with embedded HIPPA version 3.0, dated 07/17/2018 were provided to the patient.  Patient reads, speaks, and understands [place].   Patient was provided with the business card of this Coordinator and encouraged to contact the research team with any questions.  Approximately 15 minutes were spent with the patient reviewing the informed consent documents.  Patient provided the option of taking informed consent documents home to review and was encouraged to review at their convenience with their support network, including other care providers. Patient took the consent documents home to review. The plan is to speak with patient mid week to confirm if he is interested. Thanked patient and his wife for their time.  Carol Ada, RT(R)(T) Clinical Research Coordinator

## 2020-09-07 NOTE — Telephone Encounter (Signed)
EXACT SCIENCES 2018-01: BLOOD SAMPLE COLLECTION TO EVALUATE BIOMARKERS IN SUBJECTS WITH UNTREATED SOLID TUMORS  09/07/20     10:09AM  PHONE CALL: Confirmed I was speaking with Walter [name]. Introduced myself to patient and informed him reason for call is that he would be able to participate in the above mentioned trial. Informed patient his participation would be voluntary and explained the purpose of the study and that it is a one time blood collection. This clinical research coordinator plans on speaking with patient in further detail after his visit with Ned Card, NP. Thanked patient for his time.   Carol Ada, RT(R)(T) Clinical Research Coordinator

## 2020-09-08 ENCOUNTER — Other Ambulatory Visit: Payer: Self-pay | Admitting: Oncology

## 2020-09-08 ENCOUNTER — Other Ambulatory Visit: Payer: Self-pay

## 2020-09-08 ENCOUNTER — Ambulatory Visit (HOSPITAL_COMMUNITY)
Admission: RE | Admit: 2020-09-08 | Discharge: 2020-09-08 | Disposition: A | Discharge status: Home / Self Care | Payer: Medicare Other | Source: Ambulatory Visit | Attending: Oncology | Admitting: Oncology

## 2020-09-08 DIAGNOSIS — Z79899 Other long term (current) drug therapy: Secondary | ICD-10-CM | POA: Diagnosis not present

## 2020-09-08 DIAGNOSIS — Z923 Personal history of irradiation: Secondary | ICD-10-CM | POA: Insufficient documentation

## 2020-09-08 DIAGNOSIS — C20 Malignant neoplasm of rectum: Secondary | ICD-10-CM

## 2020-09-08 DIAGNOSIS — Z7982 Long term (current) use of aspirin: Secondary | ICD-10-CM | POA: Diagnosis not present

## 2020-09-08 DIAGNOSIS — G4730 Sleep apnea, unspecified: Secondary | ICD-10-CM | POA: Diagnosis not present

## 2020-09-08 DIAGNOSIS — I10 Essential (primary) hypertension: Secondary | ICD-10-CM | POA: Insufficient documentation

## 2020-09-08 HISTORY — PX: IR IMAGING GUIDED PORT INSERTION: IMG5740

## 2020-09-08 LAB — CBC WITH DIFFERENTIAL/PLATELET
Abs Immature Granulocytes: 0.02 10*3/uL (ref 0.00–0.07)
Basophils Absolute: 0.1 10*3/uL (ref 0.0–0.1)
Basophils Relative: 1 %
Eosinophils Absolute: 0.1 10*3/uL (ref 0.0–0.5)
Eosinophils Relative: 2 %
HCT: 39.4 % (ref 39.0–52.0)
Hemoglobin: 12.7 g/dL — ABNORMAL LOW (ref 13.0–17.0)
Immature Granulocytes: 0 %
Lymphocytes Relative: 24 %
Lymphs Abs: 1.9 10*3/uL (ref 0.7–4.0)
MCH: 26.2 pg (ref 26.0–34.0)
MCHC: 32.2 g/dL (ref 30.0–36.0)
MCV: 81.4 fL (ref 80.0–100.0)
Monocytes Absolute: 0.7 10*3/uL (ref 0.1–1.0)
Monocytes Relative: 9 %
Neutro Abs: 5.1 10*3/uL (ref 1.7–7.7)
Neutrophils Relative %: 64 %
Platelets: 273 10*3/uL (ref 150–400)
RBC: 4.84 MIL/uL (ref 4.22–5.81)
RDW: 14.6 % (ref 11.5–15.5)
WBC: 7.9 10*3/uL (ref 4.0–10.5)
nRBC: 0 % (ref 0.0–0.2)

## 2020-09-08 LAB — BASIC METABOLIC PANEL
Anion gap: 12 (ref 5–15)
BUN: 39 mg/dL — ABNORMAL HIGH (ref 8–23)
CO2: 28 mmol/L (ref 22–32)
Calcium: 9.4 mg/dL (ref 8.9–10.3)
Chloride: 98 mmol/L (ref 98–111)
Creatinine, Ser: 2.28 mg/dL — ABNORMAL HIGH (ref 0.61–1.24)
GFR, Estimated: 30 mL/min — ABNORMAL LOW (ref >60)
Glucose, Bld: 102 mg/dL — ABNORMAL HIGH (ref 70–99)
Potassium: 3.1 mmol/L — ABNORMAL LOW (ref 3.5–5.1)
Sodium: 138 mmol/L (ref 135–145)

## 2020-09-08 LAB — PROTIME-INR
INR: 1 (ref 0.8–1.2)
Prothrombin Time: 12.7 seconds (ref 11.4–15.2)

## 2020-09-08 MED ORDER — FENTANYL CITRATE (PF) 100 MCG/2ML IJ SOLN
INTRAMUSCULAR | Status: AC | PRN
  Administered 2020-09-08 (×2): 50 ug via INTRAVENOUS

## 2020-09-08 MED ORDER — HEPARIN SOD (PORK) LOCK FLUSH 100 UNIT/ML IV SOLN
INTRAVENOUS | Status: AC
  Filled 2020-09-08: qty 5

## 2020-09-08 MED ORDER — LIDOCAINE HCL 1 % IJ SOLN
INTRAMUSCULAR | Status: AC
  Filled 2020-09-08: qty 20

## 2020-09-08 MED ORDER — MIDAZOLAM HCL 2 MG/2ML IJ SOLN
INTRAMUSCULAR | Status: AC
  Filled 2020-09-08: qty 4

## 2020-09-08 MED ORDER — MIDAZOLAM HCL 2 MG/2ML IJ SOLN
INTRAMUSCULAR | Status: AC | PRN
Start: 2020-09-08 — End: 2020-09-08
  Administered 2020-09-08 (×2): 1 mg via INTRAVENOUS

## 2020-09-08 MED ORDER — LIDOCAINE-EPINEPHRINE 1 %-1:100000 IJ SOLN
INTRAMUSCULAR | Status: AC | PRN
  Administered 2020-09-08: 10 mL via INTRADERMAL

## 2020-09-08 MED ORDER — LIDOCAINE HCL (PF) 1 % IJ SOLN
INTRAMUSCULAR | Status: AC | PRN
  Administered 2020-09-08: 10 mL via INTRADERMAL

## 2020-09-08 MED ORDER — SODIUM CHLORIDE 0.9 % IV SOLN: INTRAVENOUS | Status: DC

## 2020-09-08 MED ORDER — HEPARIN SOD (PORK) LOCK FLUSH 100 UNIT/ML IV SOLN
INTRAVENOUS | Status: AC | PRN
  Administered 2020-09-08: 500 [IU] via INTRAVENOUS

## 2020-09-08 MED ORDER — CEFAZOLIN SODIUM-DEXTROSE 2-4 GM/100ML-% IV SOLN
INTRAVENOUS | Status: AC
  Administered 2020-09-08: 12:00:00 2 g via INTRAVENOUS
  Filled 2020-09-08: qty 100

## 2020-09-08 MED ORDER — CEFAZOLIN SODIUM-DEXTROSE 2-4 GM/100ML-% IV SOLN: 2.0000 g | INTRAVENOUS | Status: AC

## 2020-09-08 MED ORDER — LIDOCAINE-EPINEPHRINE 1 %-1:100000 IJ SOLN
INTRAMUSCULAR | Status: AC
  Filled 2020-09-08: qty 1

## 2020-09-08 MED ORDER — FENTANYL CITRATE (PF) 100 MCG/2ML IJ SOLN
INTRAMUSCULAR | Status: AC
  Filled 2020-09-08: qty 2

## 2020-09-08 NOTE — Consult Note (Signed)
Chief Complaint: Patient was seen in consultation today for port a cath placement  Referring Physician(s): Sherrill,Gary B  Supervising Physician: Markus Daft  Patient Status: [facility] - Out-pt  History of Present Illness: Walter [name] is a 69 y.o. male with history of renal insufficiency, hypertension, nephrolithiasis, sleep apnea, umbilical hernia repair, colon polyps and recently diagnosed locally advanced rectal cancer who presents today for Port-A-Cath placement for chemotherapy.  He also had history of left testicular cancer treated in [place] in the 1990s with with orchiectomy and radiation.  Past Medical History:  Diagnosis Date  . Hypertension   . Rectal cancer ([place]) dx'd 07/2020  . testicular ca    XRT comp    Past Surgical History:  Procedure Laterality Date  . RECTAL BIOPSY  2021    Allergies: Patient has no allergy information on record.  Medications: Prior to Admission medications   Medication Sig Start Date End Date Taking? Authorizing Provider  amLODipine (NORVASC) 10 MG tablet Take 10 mg by mouth daily. 07/09/20   [provider]  aspirin 81 MG EC tablet Take 81 mg by mouth once a week.    [provider]  chlorthalidone (HYGROTON) 25 MG tablet Take 25 mg by mouth daily. 07/10/20   [provider]  Cholecalciferol (D3-1000) 25 MCG (1000 UT) tablet Take 1,000 Units by mouth daily.    [provider]  cyanocobalamin 1000 MCG tablet Take 1,000 mcg by mouth daily.    [provider]  ELDERBERRY PO Take 1,000 mg by mouth 1 day or 1 dose.    [provider]  lidocaine-prilocaine (EMLA) cream Apply to port site 1-2 hours prior to use 09/07/20   Owens Shark, NP  Multiple Vitamin (MULTIVITAMIN) tablet Take 1 tablet by mouth daily.    [provider]  prochlorperazine (COMPAZINE) 10 MG tablet Take 1 tablet (10 mg total) by mouth every 6 (six) hours as needed for nausea. 08/28/20   Ladell Pier, MD   QUERCETIN PO Take 800 mg by mouth 1 day or 1 dose.    [provider]     No family history on file.  Social History   Socioeconomic History  . Marital status: Married    Spouse name: Not on file  . Number of children: Not on file  . Years of education: Not on file  . Highest education level: Not on file  Occupational History  . Not on file  Tobacco Use  . Smoking status: Not on file  . Smokeless tobacco: Not on file  Substance and Sexual Activity  . Alcohol use: Not on file  . Drug use: Not on file  . Sexual activity: Not on file  Other Topics Concern  . Not on file  Social History Narrative  . Not on file   Social Determinants of Health   Financial Resource Strain: Low Risk   . Difficulty of Paying Living Expenses: Not hard at all  Food Insecurity: No Food Insecurity  . Worried About Charity fundraiser in the Last Year: Never true  . Ran Out of Food in the Last Year: Never true  Transportation Needs: No Transportation Needs  . Lack of Transportation (Medical): No  . Lack of Transportation (Non-Medical): No  Physical Activity: Not on file  Stress: Not on file  Social Connections: Moderately Isolated  . Frequency of Communication with Friends and Family: More than three times a week  . Frequency of Social Gatherings with Friends and Family:  Once a week  . Attends Religious Services: Never  . Active Member of Clubs or Organizations: No  . Attends Archivist Meetings: Never  . Marital Status: Married      Review of Systems currently denies fever, headache, chest pain, dyspnea, cough, abdominal/back pain, nausea, vomiting.  He does have some occasional rectal bleeding.  Vital Signs: BP (!) 153/86 (BP Location: Right Arm)   Pulse 97   Temp 98.3 F (36.8 C) (Oral)   Resp 18   SpO2 97%   Physical Exam awake, alert.  Chest clear to auscultation bilaterally.  Heart with regular rate and rhythm.  Abdomen soft, positive bowel sounds, nontender.   Trace to 1+ pretibial edema bilaterally.  Imaging: CT CHEST ABDOMEN PELVIS WO CONTRAST  Result Date: 08/17/2020 CLINICAL DATA:  Rectal adenocarcinoma. Staging. Remote history of left testicular carcinoma. EXAM: CT CHEST, ABDOMEN AND PELVIS WITHOUT CONTRAST TECHNIQUE: Multidetector CT imaging of the chest, abdomen and pelvis was performed following the standard protocol without IV contrast. COMPARISON:  None FINDINGS: CT CHEST FINDINGS Cardiovascular: The heart size appears within normal limits. There is no pericardial effusion identified. Aortic atherosclerosis. Mediastinum/Nodes: No enlarged mediastinal, hilar, or axillary lymph nodes. Thyroid gland, trachea, and esophagus demonstrate no significant findings. Lungs/Pleura: No pleural effusion. Asymmetric elevation of left hemidiaphragm. Scar noted within the anterior left lower lobe. Ground-glass attenuating nodule in the left upper lobe is best seen on coronal image 73/4. This measures 0.7 x 0.6 cm. 3 mm right middle lobe lung nodule is noted, image 131/6. Musculoskeletal: No chest wall mass or suspicious bone lesions identified. CT ABDOMEN PELVIS FINDINGS Hepatobiliary: Multiple liver cysts are identified. The largest arises from the posteromedial right hepatic lobe measuring 2.8 cm, image 74/2. Additionally, there are several scattered, ill-defined foci which are either too small to reliably characterize by CT. The gallbladder appears within normal limits. No signs of bile duct dilatation. Pancreas: Unremarkable. No pancreatic ductal dilatation or surrounding inflammatory changes. Spleen: Normal in size without focal abnormality. Adrenals/Urinary Tract: Normal appearance of the adrenal glands. Mild asymmetric left renal atrophy. 3.4 cm cyst arises from upper pole of the right kidney. This is incompletely characterized without IV contrast. No kidney stone or hydronephrosis identified bilaterally. Several diverticula are noted arising off the anterior  bladder wall, image 110/2. Bladder is otherwise unremarkable. Stomach/Bowel: Stomach is nondistended no small bowel wall thickening, inflammation, or distension. There is a large, midline ventral abdominal wall hernia which contains the proximal right colon. This measures 8 cm in transverse dimension. No signs of bowel obstruction. Rectal mass is difficult to identified reflecting lack of IV and enteric contrast material. Vascular/Lymphatic: Aortic atherosclerosis. No aneurysm. No abdominopelvic adenopathy. Reproductive: Mild prostate gland enlargement. Other: No free fluid or fluid collections within the abdomen or pelvis. No peritoneal nodularity. Musculoskeletal: No acute or significant osseous findings. IMPRESSION: 1. Rectal mass is difficult to identified reflecting lack of IV and enteric contrast material. No specific findings identified to suggest solid organ metastasis or nodal metastasis within the chest, abdomen or pelvis. 2. Multiple liver cysts. Additionally, there are scattered, low attenuation within the liver which are too small to reliably characterize by CT. More definitive characterization the obtained with contrast enhanced MRI liver. Alternatively recommend attention on follow-up imaging is advised. 3. Large, midline ventral abdominal wall hernia which contains the proximal right colon. No signs of bowel obstruction. 4. Ground-glass attenuating nodule in the left upper lobe measures 0.7 x 0.6 cm. Initial follow-up with CT at 6-12 months  is recommended to confirm persistence. If persistent, repeat CT is recommended every 2 years until 5 years of stability has been established. This recommendation follows the consensus statement: Guidelines for Management of Incidental Pulmonary Nodules Detected on CT Images: From the Fleischner Society 2017; Radiology 2017; 284:228-243. 5. 3 mm, nonspecific solid nodule is identified within the right middle lobe. Attention on follow-up imaging is advised. 6.  Aortic atherosclerosis. Aortic Atherosclerosis (ICD10-I70.0). Electronically Signed   By: Kerby Moors M.D.   On: 08/17/2020 14:05    Labs:  CBC: No results for input(s): WBC, HGB, HCT, PLT in the last 8760 hours.  COAGS: No results for input(s): INR, APTT in the last 8760 hours.  BMP: Recent Labs    08/17/20 1146  CREATININE 2.70*    LIVER FUNCTION TESTS: No results for input(s): BILITOT, AST, ALT, ALKPHOS, PROT, ALBUMIN in the last 8760 hours.  TUMOR MARKERS: No results for input(s): AFPTM, CEA, CA199, CHROMGRNA in the last 8760 hours.  Assessment and Plan: 69 y.o. male with history of renal insufficiency, hypertension, nephrolithiasis, sleep apnea, umbilical hernia repair, colon polyps and recently diagnosed locally advanced rectal cancer who presents today for Port-A-Cath placement for chemotherapy.  He also had history of left testicular cancer treated in [place] in the 1990s with with orchiectomy and radiation.Risks and benefits of image guided port-a-catheter placement was discussed with the patient including, but not limited to bleeding, infection, pneumothorax, or fibrin sheath development and need for additional procedures.  All of the patient's questions were answered, patient is agreeable to proceed. Consent signed and in chart.     Thank you for this interesting consult.  I greatly enjoyed meeting Walter [name] and look forward to participating in their care.  A copy of this report was sent to the requesting provider on this date.  Electronically Signed: D. Rowe Robert, PA-C 09/08/2020, 10:37 AM   I spent a total of  25 minutes   in face to face in clinical consultation, greater than 50% of which was counseling/coordinating care for Port-A-Cath placement

## 2020-09-08 NOTE — Discharge Instructions (Signed)
Implanted Port Insertion, Care After This sheet gives you information about how to care for yourself after your procedure. Your health care provider may also give you more specific instructions. If you have problems or questions, contact your health care provider. What can I expect after the procedure? After the procedure, it is common to have:  Discomfort at the port insertion site.  Bruising on the skin over the port. This should improve over 3-4 days. Follow these instructions at home: Port care  After your port is placed, you will get a manufacturer's information card. The card has information about your port. Keep this card with you at all times.  Take care of the port as told by your health care provider. Ask your health care provider if you or a family member can get training for taking care of the port at home. A home health care nurse may also take care of the port.  Make sure to remember what type of port you have. Incision care      Follow instructions from your health care provider about how to take care of your port insertion site. Make sure you: ? Wash your hands with soap and water before and after you change your bandage (dressing). If soap and water are not available, use hand sanitizer. ? Change your dressing as told by your health care provider. ? Leave stitches (sutures), skin glue, or adhesive strips in place. These skin closures may need to stay in place for 2 weeks or longer. If adhesive strip edges start to loosen and curl up, you may trim the loose edges. Do not remove adhesive strips completely unless your health care provider tells you to do that.  Check your port insertion site every day for signs of infection. Check for: ? Redness, swelling, or pain. ? Fluid or blood. ? Warmth. ? Pus or a bad smell. Activity  Return to your normal activities as told by your health care provider. Ask your health care provider what activities are safe for you.  Do not  lift anything that is heavier than 10 lb (4.5 kg), or the limit that you are told, until your health care provider says that it is safe. General instructions  Take over-the-counter and prescription medicines only as told by your health care provider.  Do not take baths, swim, or use a hot tub until your health care provider approves. Ask your health care provider if you may take showers. You may only be allowed to take sponge baths.  Do not drive for 24 hours if you were given a sedative during your procedure.  Wear a medical alert bracelet in case of an emergency. This will tell any health care providers that you have a port.  Keep all follow-up visits as told by your health care provider. This is important. Contact a health care provider if:  You cannot flush your port with saline as directed, or you cannot draw blood from the port.  You have a fever or chills.  You have redness, swelling, or pain around your port insertion site.  You have fluid or blood coming from your port insertion site.  Your port insertion site feels warm to the touch.  You have pus or a bad smell coming from the port insertion site. Get help right away if:  You have chest pain or shortness of breath.  You have bleeding from your port that you cannot control. Summary  Take care of the port as told by your health   care provider. Keep the manufacturer's information card with you at all times.  Change your dressing as told by your health care provider.  Contact a health care provider if you have a fever or chills or if you have redness, swelling, or pain around your port insertion site.  Keep all follow-up visits as told by your health care provider. This information is not intended to replace advice given to you by your health care provider. Make sure you discuss any questions you have with your health care provider. Document Revised: 04/03/2018 Document Reviewed: 04/03/2018 Elsevier Patient Education   2020 [employer] Implanted Port Home Guide An implanted port is a device that is placed under the skin. It is usually placed in the chest. The device can be used to give IV medicine, to take blood, or for dialysis. You may have an implanted port if:  You need IV medicine that would be irritating to the small veins in your hands or arms.  You need IV medicines, such as antibiotics, for a long period of time.  You need IV nutrition for a long period of time.  You need dialysis. Having a port means that your health care provider will not need to use the veins in your arms for these procedures. You may have fewer limitations when using a port than you would if you used other types of long-term IVs, and you will likely be able to return to normal activities after your incision heals. An implanted port has two main parts:  Reservoir. The reservoir is the part where a needle is inserted to give medicines or draw blood. The reservoir is round. After it is placed, it appears as a small, raised area under your skin.  Catheter. The catheter is a thin, flexible tube that connects the reservoir to a vein. Medicine that is inserted into the reservoir goes into the catheter and then into the vein. How is my port accessed? To access your port:  A numbing cream may be placed on the skin over the port site.  Your health care provider will put on a mask and sterile gloves.  The skin over your port will be cleaned carefully with a germ-killing soap and allowed to dry.  Your health care provider will gently pinch the port and insert a needle into it.  Your health care provider will check for a blood return to make sure the port is in the vein and is not clogged.  If your port needs to remain accessed to get medicine continuously (constant infusion), your health care provider will place a clear bandage (dressing) over the needle site. The dressing and needle will need to be changed every week, or as told  by your health care provider. What is flushing? Flushing helps keep the port from getting clogged. Follow instructions from your health care provider about how and when to flush the port. Ports are usually flushed with saline solution or a medicine called heparin. The need for flushing will depend on how the port is used:  If the port is only used from time to time to give medicines or draw blood, the port may need to be flushed: ? Before and after medicines have been given. ? Before and after blood has been drawn. ? As part of routine maintenance. Flushing may be recommended every 4-6 weeks.  If a constant infusion is running, the port may not need to be flushed.  Throw away any syringes in a disposal container that is meant   for sharp items (sharps container). You can buy a sharps container from a pharmacy, or you can make one by using an empty hard plastic bottle with a cover. How long will my port stay implanted? The port can stay in for as long as your health care provider thinks it is needed. When it is time for the port to come out, a surgery will be done to remove it. The surgery will be similar to the procedure that was done to put the port in. Follow these instructions at home:   Flush your port as told by your health care provider.  If you need an infusion over several days, follow instructions from your health care provider about how to take care of your port site. Make sure you: ? Wash your hands with soap and water before you change your dressing. If soap and water are not available, use alcohol-based hand sanitizer. ? Change your dressing as told by your health care provider. ? Place any used dressings or infusion bags into a plastic bag. Throw that bag in the trash. ? Keep the dressing that covers the needle clean and dry. Do not get it wet. ? Do not use scissors or sharp objects near the tube. ? Keep the tube clamped, unless it is being used.  Check your port site every day  for signs of infection. Check for: ? Redness, swelling, or pain. ? Fluid or blood. ? Pus or a bad smell.  Protect the skin around the port site. ? Avoid wearing bra straps that rub or irritate the site. ? Protect the skin around your port from seat belts. Place a soft pad over your chest if needed.  Bathe or shower as told by your health care provider. The site may get wet as long as you are not actively receiving an infusion.  Return to your normal activities as told by your health care provider. Ask your health care provider what activities are safe for you.  Carry a medical alert card or wear a medical alert bracelet at all times. This will let health care providers know that you have an implanted port in case of an emergency. Get help right away if:  You have redness, swelling, or pain at the port site.  You have fluid or blood coming from your port site.  You have pus or a bad smell coming from the port site.  You have a fever. Summary  Implanted ports are usually placed in the chest for long-term IV access.  Follow instructions from your health care provider about flushing the port and changing bandages (dressings).  Take care of the area around your port by avoiding clothing that puts pressure on the area, and by watching for signs of infection.  Protect the skin around your port from seat belts. Place a soft pad over your chest if needed.  Get help right away if you have a fever or you have redness, swelling, pain, drainage, or a bad smell at the port site. This information is not intended to replace advice given to you by your health care provider. Make sure you discuss any questions you have with your health care provider. Document Revised: 12/28/2018 Document Reviewed: 10/08/2016 Elsevier Patient Education  2020 [employer] Moderate Conscious Sedation, Adult, Care After These instructions provide you with information about caring for yourself after your procedure.  Your health care provider may also give you more specific instructions. Your treatment has been planned according to current medical practices, but   problems sometimes occur. Call your health care provider if you have any problems or questions after your procedure. What can I expect after the procedure? After your procedure, it is common:  To feel sleepy for several hours.  To feel clumsy and have poor balance for several hours.  To have poor judgment for several hours.  To vomit if you eat too soon. Follow these instructions at home: For at least 24 hours after the procedure:   Do not: ? Participate in activities where you could fall or become injured. ? Drive. ? Use heavy machinery. ? Drink alcohol. ? Take sleeping pills or medicines that cause drowsiness. ? Make important decisions or sign legal documents. ? Take care of children on your own.  Rest. Eating and drinking  Follow the diet recommended by your health care provider.  If you vomit: ? Drink water, juice, or soup when you can drink without vomiting. ? Make sure you have little or no nausea before eating solid foods. General instructions  Have a responsible adult stay with you until you are awake and alert.  Take over-the-counter and prescription medicines only as told by your health care provider.  If you smoke, do not smoke without supervision.  Keep all follow-up visits as told by your health care provider. This is important. Contact a health care provider if:  You keep feeling nauseous or you keep vomiting.  You feel light-headed.  You develop a rash.  You have a fever. Get help right away if:  You have trouble breathing. This information is not intended to replace advice given to you by your health care provider. Make sure you discuss any questions you have with your health care provider. Document Revised: 08/18/2017 Document Reviewed: 12/26/2015 Elsevier Patient Education  2020 [employer]  

## 2020-09-08 NOTE — Procedures (Signed)
Interventional Radiology Procedure:   Indications: Rectal cancer  Procedure: Port placement  Findings: Right jugular port, tip at SVC/RA junction  Complications: None     EBL: Minimal, less than 10 ml  Plan: Discharge in one hour.  Keep port site and incisions dry for at least 24 hours.     Yoneko Talerico R. Adalyn Pennock, MD  Pager: [phone]   

## 2020-09-09 ENCOUNTER — Telehealth: Payer: Self-pay | Admitting: Radiology

## 2020-09-09 ENCOUNTER — Other Ambulatory Visit: Payer: Self-pay | Admitting: *Deleted

## 2020-09-09 NOTE — Telephone Encounter (Signed)
[employer] 2018-01: BLOOD SAMPLE COLLECTION TO EVALUATE BIOMARKERS IN SUBJECTS WITH UNTREATED SOLID TUMORS   09/09/2020    11:13AM  PHONE CALL: LVM to follow-up about interest in participating in the above listed study. Asked patient to return my call at his convenience if he is interested.  Carol Ada, RT(R)(T) Clinical Research Coordinator

## 2020-09-13 ENCOUNTER — Other Ambulatory Visit: Payer: Self-pay | Admitting: Oncology

## 2020-09-15 ENCOUNTER — Inpatient Hospital Stay: Payer: Medicare Other

## 2020-09-15 ENCOUNTER — Other Ambulatory Visit: Payer: Self-pay | Admitting: Nurse Practitioner

## 2020-09-15 ENCOUNTER — Other Ambulatory Visit: Payer: Self-pay

## 2020-09-15 ENCOUNTER — Telehealth: Payer: Self-pay | Admitting: *Deleted

## 2020-09-15 ENCOUNTER — Inpatient Hospital Stay: Payer: Medicare Other | Admitting: Radiology

## 2020-09-15 ENCOUNTER — Encounter: Payer: Self-pay | Admitting: Oncology

## 2020-09-15 ENCOUNTER — Encounter: Payer: Self-pay | Admitting: Radiology

## 2020-09-15 ENCOUNTER — Other Ambulatory Visit: Payer: Self-pay | Admitting: Medical Oncology

## 2020-09-15 VITALS — BP 156/76 | HR 88 | Temp 98.4°F | Resp 16 | Wt 199.2 lb

## 2020-09-15 DIAGNOSIS — C20 Malignant neoplasm of rectum: Secondary | ICD-10-CM

## 2020-09-15 DIAGNOSIS — Z95828 Presence of other vascular implants and grafts: Secondary | ICD-10-CM

## 2020-09-15 DIAGNOSIS — Z5111 Encounter for antineoplastic chemotherapy: Secondary | ICD-10-CM | POA: Diagnosis not present

## 2020-09-15 LAB — CBC WITH DIFFERENTIAL (CANCER CENTER ONLY)
Abs Immature Granulocytes: 0.01 10*3/uL (ref 0.00–0.07)
Basophils Absolute: 0.1 10*3/uL (ref 0.0–0.1)
Basophils Relative: 1 %
Eosinophils Absolute: 0.2 10*3/uL (ref 0.0–0.5)
Eosinophils Relative: 2 %
HCT: 36.3 % — ABNORMAL LOW (ref 39.0–52.0)
Hemoglobin: 11.6 g/dL — ABNORMAL LOW (ref 13.0–17.0)
Immature Granulocytes: 0 %
Lymphocytes Relative: 22 %
Lymphs Abs: 1.8 10*3/uL (ref 0.7–4.0)
MCH: 26.2 pg (ref 26.0–34.0)
MCHC: 32 g/dL (ref 30.0–36.0)
MCV: 82.1 fL (ref 80.0–100.0)
Monocytes Absolute: 0.9 10*3/uL (ref 0.1–1.0)
Monocytes Relative: 12 %
Neutro Abs: 5 10*3/uL (ref 1.7–7.7)
Neutrophils Relative %: 63 %
Platelet Count: 220 10*3/uL (ref 150–400)
RBC: 4.42 MIL/uL (ref 4.22–5.81)
RDW: 14.8 % (ref 11.5–15.5)
WBC Count: 7.9 10*3/uL (ref 4.0–10.5)
nRBC: 0 % (ref 0.0–0.2)

## 2020-09-15 LAB — CEA (IN HOUSE-CHCC): CEA (CHCC-In House): 11.24 ng/mL — ABNORMAL HIGH (ref 0.00–5.00)

## 2020-09-15 LAB — CMP (CANCER CENTER ONLY)
ALT: 20 U/L (ref 0–44)
AST: 22 U/L (ref 15–41)
Albumin: 3.4 g/dL — ABNORMAL LOW (ref 3.5–5.0)
Alkaline Phosphatase: 67 U/L (ref 38–126)
Anion gap: 6 (ref 5–15)
BUN: 31 mg/dL — ABNORMAL HIGH (ref 8–23)
CO2: 32 mmol/L (ref 22–32)
Calcium: 9.5 mg/dL (ref 8.9–10.3)
Chloride: 101 mmol/L (ref 98–111)
Creatinine: 2.25 mg/dL — ABNORMAL HIGH (ref 0.61–1.24)
GFR, Estimated: 31 mL/min — ABNORMAL LOW (ref >60)
Glucose, Bld: 111 mg/dL — ABNORMAL HIGH (ref 70–99)
Potassium: 3.3 mmol/L — ABNORMAL LOW (ref 3.5–5.1)
Sodium: 139 mmol/L (ref 135–145)
Total Bilirubin: 0.3 mg/dL (ref 0.3–1.2)
Total Protein: 7.4 g/dL (ref 6.5–8.1)

## 2020-09-15 LAB — RESEARCH LABS

## 2020-09-15 MED ORDER — SODIUM CHLORIDE 0.9 % IV SOLN
2400.0000 mg/m2 | INTRAVENOUS | Status: DC
  Administered 2020-09-15: 5100 mg via INTRAVENOUS
  Filled 2020-09-15: qty 102

## 2020-09-15 MED ORDER — LEUCOVORIN CALCIUM INJECTION 350 MG
400.0000 mg/m2 | Freq: Once | INTRAVENOUS | Status: AC
  Administered 2020-09-15: 15:00:00 848 mg via INTRAVENOUS
  Filled 2020-09-15: qty 42.4

## 2020-09-15 MED ORDER — OXALIPLATIN CHEMO INJECTION 100 MG/20ML
65.0000 mg/m2 | Freq: Once | INTRAVENOUS | Status: AC
  Administered 2020-09-15: 15:00:00 140 mg via INTRAVENOUS
  Filled 2020-09-15: qty 20

## 2020-09-15 MED ORDER — PALONOSETRON HCL INJECTION 0.25 MG/5ML
0.2500 mg | Freq: Once | INTRAVENOUS | Status: AC
  Administered 2020-09-15: 14:00:00 0.25 mg via INTRAVENOUS

## 2020-09-15 MED ORDER — DEXTROSE 5 % IV SOLN
Freq: Once | INTRAVENOUS | Status: AC
  Filled 2020-09-15: qty 250

## 2020-09-15 MED ORDER — FLUOROURACIL CHEMO INJECTION 2.5 GM/50ML
400.0000 mg/m2 | Freq: Once | INTRAVENOUS | Status: AC
  Administered 2020-09-15: 17:00:00 850 mg via INTRAVENOUS
  Filled 2020-09-15: qty 17

## 2020-09-15 MED ORDER — POTASSIUM CHLORIDE CRYS ER 20 MEQ PO TBCR: 20.0000 meq | EXTENDED_RELEASE_TABLET | Freq: Every day | ORAL | 1 refills | Status: DC

## 2020-09-15 MED ORDER — SODIUM CHLORIDE 0.9% FLUSH
10.0000 mL | Freq: Once | INTRAVENOUS | Status: AC
  Administered 2020-09-15: 12:00:00 10 mL via INTRAVENOUS
  Filled 2020-09-15: qty 10

## 2020-09-15 MED ORDER — PALONOSETRON HCL INJECTION 0.25 MG/5ML
INTRAVENOUS | Status: AC
  Filled 2020-09-15: qty 5

## 2020-09-15 MED ORDER — DEXAMETHASONE SODIUM PHOSPHATE 100 MG/10ML IJ SOLN
10.0000 mg | Freq: Once | INTRAMUSCULAR | Status: AC
  Administered 2020-09-15: 14:00:00 10 mg via INTRAVENOUS
  Filled 2020-09-15: qty 10

## 2020-09-15 NOTE — Progress Notes (Signed)
Per Dr Truett Perna okay to treat with creat 2.25.

## 2020-09-15 NOTE — Telephone Encounter (Signed)
Notified of low K+ level and script sent to his [facility] to begin taking. He understands and agrees.

## 2020-09-15 NOTE — Telephone Encounter (Signed)
-----   Message from Rana Snare, NP sent at 09/15/2020  2:04 PM EST ----- Please let him know the potassium level is mildly low.  I am sending a prescription to his pharmacy for K-Dur 20 milliequivalents daily.

## 2020-09-15 NOTE — Research (Signed)
[employer] 2018-01: BLOOD SAMPLE COLLECTION TO EVALUATE BIOMARKERS IN SUBJECTS WITH UNTREATED SOLID TUMORS  09/15/2020     11:50AM  Patient Walter [name] was identified by Dr. Benay Spice as a potential candidate for the above listed study.  This Clinical Research Coordinator met with Walter [name], [id number] on 09/15/20 in a manner and location that ensures patient privacy to discuss participation in the above listed research study.  Patient is Unaccompanied.  Patient was previously provided with informed consent documents.  Patient confirmed they have read the informed consent documents.  As outlined in the informed consent form, this Coordinator and Walter [name] discussed the purpose of the research study, the investigational nature of the study, study procedures and requirements for study participation, potential risks and benefits of study participation, as well as alternatives to participation.  The patient understands participation is voluntary and they may withdraw from study participation at any time.  This study does not involve randomization.  This study does not involve an investigational drug or device. This study does not involve a placebo. Patient understands enrollment is pending full eligibility review.   Confidentiality and how the patient's information will be used as part of study participation were discussed.  Patient was informed there is reimbursement provided for their time and effort spent on trial participation.  The patient is encouraged to discuss research study participation with their insurance provider to determine what costs they may incur as part of study participation, including research related injury.    All questions were answered to patient's satisfaction.  The informed consent with embedded HIPAA language was reviewed page by page.  The patient's mental and emotional status is appropriate to provide informed consent, and the patient verbalizes an  understanding of study participation.  Patient has agreed to participate in the above listed research study and has voluntarily signed the informed consent version with embedded HIPAA language, version 3.0, dated 07/17/2020 on 09/15/20 at 11:56 AM.  The patient was provided with a copy of the signed informed consent form with embedded HIPAA language for their reference.  No study specific procedures were obtained prior to the signing of the informed consent document.  Approximately 10 minutes were spent with the patient reviewing the informed consent documents.  Patient was not requested to complete a Release of Information form.   BLOOD COLLECTION: After signing informed consent, patient was walked to lab. Patients port was accessed for this blood collection. All tubes were collected in order in accordance with the protocol at 1230PM. Patient tolerated the blood collection well. Patient was thanked for his time and participation in this study and was given a $50 [employer] gift card.   Walter [name], RT(R)(T) Clinical Research Coordinator

## 2020-09-15 NOTE — Patient Instructions (Addendum)
[facility] Discharge Instructions for Patients Receiving Chemotherapy  Today you received the following chemotherapy agents: Oxaliplatin/Leucovorin/Fluorouracil.  To help prevent nausea and vomiting after your treatment, we encourage you to take your nausea medication as directed.   If you develop nausea and vomiting that is not controlled by your nausea medication, call the clinic.   BELOW ARE SYMPTOMS THAT SHOULD BE REPORTED IMMEDIATELY:  *FEVER GREATER THAN 100.5 F  *CHILLS WITH OR WITHOUT FEVER  NAUSEA AND VOMITING THAT IS NOT CONTROLLED WITH YOUR NAUSEA MEDICATION  *UNUSUAL SHORTNESS OF BREATH  *UNUSUAL BRUISING OR BLEEDING  TENDERNESS IN MOUTH AND THROAT WITH OR WITHOUT PRESENCE OF ULCERS  *URINARY PROBLEMS  *BOWEL PROBLEMS  UNUSUAL RASH Items with * indicate a potential emergency and should be followed up as soon as possible.  Feel free to call the clinic should you have any questions or concerns. The clinic phone number is [phone].  Please show the CHEMO ALERT CARD at check-in to the Emergency Department and triage nurse.  Oxaliplatin Injection What is this medicine? OXALIPLATIN (ox AL i PLA tin) is a chemotherapy drug. It targets fast dividing cells, like cancer cells, and causes these cells to die. This medicine is used to treat cancers of the colon and rectum, and many other cancers. This medicine may be used for other purposes; ask your health care provider or pharmacist if you have questions. COMMON BRAND NAME(S): Eloxatin What should I tell my health care provider before I take this medicine? They need to know if you have any of these conditions:  heart disease  history of irregular heartbeat  liver disease  low blood counts, like white cells, platelets, or red blood cells  lung or breathing disease, like asthma  take medicines that treat or prevent blood clots  tingling of the fingers or toes, or other nerve disorder  an  unusual or allergic reaction to oxaliplatin, other chemotherapy, other medicines, foods, dyes, or preservatives  pregnant or trying to get pregnant  breast-feeding How should I use this medicine? This drug is given as an infusion into a vein. It is administered in a hospital or clinic by a specially trained health care professional. Talk to your pediatrician regarding the use of this medicine in children. Special care may be needed. Overdosage: If you think you have taken too much of this medicine contact a poison control center or emergency room at once. NOTE: This medicine is only for you. Do not share this medicine with others. What if I miss a dose? It is important not to miss a dose. Call your doctor or health care professional if you are unable to keep an appointment. What may interact with this medicine? Do not take this medicine with any of the following medications:  cisapride  dronedarone  pimozide  thioridazine This medicine may also interact with the following medications:  aspirin and aspirin-like medicines  certain medicines that treat or prevent blood clots like warfarin, apixaban, dabigatran, and rivaroxaban  cisplatin  cyclosporine  diuretics  medicines for infection like acyclovir, adefovir, amphotericin B, bacitracin, cidofovir, foscarnet, ganciclovir, gentamicin, pentamidine, vancomycin  NSAIDs, medicines for pain and inflammation, like ibuprofen or naproxen  other medicines that prolong the QT interval (an abnormal heart rhythm)  pamidronate  zoledronic acid This list may not describe all possible interactions. Give your health care provider a list of all the medicines, herbs, non-prescription drugs, or dietary supplements you use. Also tell them if you smoke, drink alcohol, or use  illegal drugs. Some items may interact with your medicine. What should I watch for while using this medicine? Your condition will be monitored carefully while you are  receiving this medicine. You may need blood work done while you are taking this medicine. This medicine may make you feel generally unwell. This is not uncommon as chemotherapy can affect healthy cells as well as cancer cells. Report any side effects. Continue your course of treatment even though you feel ill unless your healthcare professional tells you to stop. This medicine can make you more sensitive to cold. Do not drink cold drinks or use ice. Cover exposed skin before coming in contact with cold temperatures or cold objects. When out in cold weather wear warm clothing and cover your mouth and nose to warm the air that goes into your lungs. Tell your doctor if you get sensitive to the cold. Do not become pregnant while taking this medicine or for 9 months after stopping it. Women should inform their health care professional if they wish to become pregnant or think they might be pregnant. Men should not father a child while taking this medicine and for 6 months after stopping it. There is potential for serious side effects to an unborn child. Talk to your health care professional for more information. Do not breast-feed a child while taking this medicine or for 3 months after stopping it. This medicine has caused ovarian failure in some women. This medicine may make it more difficult to get pregnant. Talk to your health care professional if you are concerned about your fertility. This medicine has caused decreased sperm counts in some men. This may make it more difficult to father a child. Talk to your health care professional if you are concerned about your fertility. This medicine may increase your risk of getting an infection. Call your health care professional for advice if you get a fever, chills, or sore throat, or other symptoms of a cold or flu. Do not treat yourself. Try to avoid being around people who are sick. Avoid taking medicines that contain aspirin, acetaminophen, ibuprofen, naproxen,  or ketoprofen unless instructed by your health care professional. These medicines may hide a fever. Be careful brushing or flossing your teeth or using a toothpick because you may get an infection or bleed more easily. If you have any dental work done, tell your dentist you are receiving this medicine. What side effects may I notice from receiving this medicine? Side effects that you should report to your doctor or health care professional as soon as possible:  allergic reactions like skin rash, itching or hives, swelling of the face, lips, or tongue  breathing problems  cough  low blood counts - this medicine may decrease the number of white blood cells, red blood cells, and platelets. You may be at increased risk for infections and bleeding  nausea, vomiting  pain, redness, or irritation at site where injected  pain, tingling, numbness in the hands or feet  signs and symptoms of bleeding such as bloody or black, tarry stools; red or dark brown urine; spitting up blood or brown material that looks like coffee grounds; red spots on the skin; unusual bruising or bleeding from the eyes, gums, or nose  signs and symptoms of a dangerous change in heartbeat or heart rhythm like chest pain; dizziness; fast, irregular heartbeat; palpitations; feeling faint or lightheaded; falls  signs and symptoms of infection like fever; chills; cough; sore throat; pain or trouble passing urine  signs and  symptoms of liver injury like dark yellow or brown urine; general ill feeling or flu-like symptoms; light-colored stools; loss of appetite; nausea; right upper belly pain; unusually weak or tired; yellowing of the eyes or skin  signs and symptoms of low red blood cells or anemia such as unusually weak or tired; feeling faint or lightheaded; falls  signs and symptoms of muscle injury like dark urine; trouble passing urine or change in the amount of urine; unusually weak or tired; muscle pain; back pain Side  effects that usually do not require medical attention (report to your doctor or health care professional if they continue or are bothersome):  changes in taste  diarrhea  gas  hair loss  loss of appetite  mouth sores This list may not describe all possible side effects. Call your doctor for medical advice about side effects. You may report side effects to FDA at 1-800-FDA-1088. Where should I keep my medicine? This drug is given in a hospital or clinic and will not be stored at home. NOTE: This sheet is a summary. It may not cover all possible information. If you have questions about this medicine, talk to your doctor, pharmacist, or health care provider.  2020 Elsevier/Gold Standard (2019-01-23 12:20:35)  Leucovorin injection What is this medicine? LEUCOVORIN (loo koe VOR in) is used to prevent or treat the harmful effects of some medicines. This medicine is used to treat anemia caused by a low amount of folic acid in the body. It is also used with 5-fluorouracil (5-FU) to treat colon cancer. This medicine may be used for other purposes; ask your health care provider or pharmacist if you have questions. What should I tell my health care provider before I take this medicine? They need to know if you have any of these conditions:  anemia from low levels of vitamin B-12 in the blood  an unusual or allergic reaction to leucovorin, folic acid, other medicines, foods, dyes, or preservatives  pregnant or trying to get pregnant  breast-feeding How should I use this medicine? This medicine is for injection into a muscle or into a vein. It is given by a health care professional in a hospital or clinic setting. Talk to your pediatrician regarding the use of this medicine in children. Special care may be needed. Overdosage: If you think you have taken too much of this medicine contact a poison control center or emergency room at once. NOTE: This medicine is only for you. Do not share this  medicine with others. What if I miss a dose? This does not apply. What may interact with this medicine?  capecitabine  fluorouracil  phenobarbital  phenytoin  primidone  trimethoprim-sulfamethoxazole This list may not describe all possible interactions. Give your health care provider a list of all the medicines, herbs, non-prescription drugs, or dietary supplements you use. Also tell them if you smoke, drink alcohol, or use illegal drugs. Some items may interact with your medicine. What should I watch for while using this medicine? Your condition will be monitored carefully while you are receiving this medicine. This medicine may increase the side effects of 5-fluorouracil, 5-FU. Tell your doctor or health care professional if you have diarrhea or mouth sores that do not get better or that get worse. What side effects may I notice from receiving this medicine? Side effects that you should report to your doctor or health care professional as soon as possible:  allergic reactions like skin rash, itching or hives, swelling of the face, lips, or  tongue  breathing problems  fever, infection  mouth sores  unusual bleeding or bruising  unusually weak or tired Side effects that usually do not require medical attention (report to your doctor or health care professional if they continue or are bothersome):  constipation or diarrhea  loss of appetite  nausea, vomiting This list may not describe all possible side effects. Call your doctor for medical advice about side effects. You may report side effects to FDA at 1-800-FDA-1088. Where should I keep my medicine? This drug is given in a hospital or clinic and will not be stored at home. NOTE: This sheet is a summary. It may not cover all possible information. If you have questions about this medicine, talk to your doctor, pharmacist, or health care provider.  2020 Elsevier/Gold Standard (2008-03-11 16:50:29)  Fluorouracil, 5-FU  injection What is this medicine? FLUOROURACIL, 5-FU (flure oh YOOR a sil) is a chemotherapy drug. It slows the growth of cancer cells. This medicine is used to treat many types of cancer like breast cancer, colon or rectal cancer, pancreatic cancer, and stomach cancer. This medicine may be used for other purposes; ask your health care provider or pharmacist if you have questions. COMMON BRAND NAME(S): Adrucil What should I tell my health care provider before I take this medicine? They need to know if you have any of these conditions:  blood disorders  dihydropyrimidine dehydrogenase (DPD) deficiency  infection (especially a virus infection such as chickenpox, cold sores, or herpes)  kidney disease  liver disease  malnourished, poor nutrition  recent or ongoing radiation therapy  an unusual or allergic reaction to fluorouracil, other chemotherapy, other medicines, foods, dyes, or preservatives  pregnant or trying to get pregnant  breast-feeding How should I use this medicine? This drug is given as an infusion or injection into a vein. It is administered in a hospital or clinic by a specially trained health care professional. Talk to your pediatrician regarding the use of this medicine in children. Special care may be needed. Overdosage: If you think you have taken too much of this medicine contact a poison control center or emergency room at once. NOTE: This medicine is only for you. Do not share this medicine with others. What if I miss a dose? It is important not to miss your dose. Call your doctor or health care professional if you are unable to keep an appointment. What may interact with this medicine?  allopurinol  cimetidine  dapsone  digoxin  hydroxyurea  leucovorin  levamisole  medicines for seizures like ethotoin, fosphenytoin, phenytoin  medicines to increase blood counts like filgrastim, pegfilgrastim, sargramostim  medicines that treat or prevent blood  clots like warfarin, enoxaparin, and dalteparin  methotrexate  metronidazole  pyrimethamine  some other chemotherapy drugs like busulfan, cisplatin, estramustine, vinblastine  trimethoprim  trimetrexate  vaccines Talk to your doctor or health care professional before taking any of these medicines:  acetaminophen  aspirin  ibuprofen  ketoprofen  naproxen This list may not describe all possible interactions. Give your health care provider a list of all the medicines, herbs, non-prescription drugs, or dietary supplements you use. Also tell them if you smoke, drink alcohol, or use illegal drugs. Some items may interact with your medicine. What should I watch for while using this medicine? Visit your doctor for checks on your progress. This drug may make you feel generally unwell. This is not uncommon, as chemotherapy can affect healthy cells as well as cancer cells. Report any side effects. Continue  your course of treatment even though you feel ill unless your doctor tells you to stop. In some cases, you may be given additional medicines to help with side effects. Follow all directions for their use. Call your doctor or health care professional for advice if you get a fever, chills or sore throat, or other symptoms of a cold or flu. Do not treat yourself. This drug decreases your body's ability to fight infections. Try to avoid being around people who are sick. This medicine may increase your risk to bruise or bleed. Call your doctor or health care professional if you notice any unusual bleeding. Be careful brushing and flossing your teeth or using a toothpick because you may get an infection or bleed more easily. If you have any dental work done, tell your dentist you are receiving this medicine. Avoid taking products that contain aspirin, acetaminophen, ibuprofen, naproxen, or ketoprofen unless instructed by your doctor. These medicines may hide a fever. Do not become pregnant while  taking this medicine. Women should inform their doctor if they wish to become pregnant or think they might be pregnant. There is a potential for serious side effects to an unborn child. Talk to your health care professional or pharmacist for more information. Do not breast-feed an infant while taking this medicine. Men should inform their doctor if they wish to father a child. This medicine may lower sperm counts. Do not treat diarrhea with over the counter products. Contact your doctor if you have diarrhea that lasts more than 2 days or if it is severe and watery. This medicine can make you more sensitive to the sun. Keep out of the sun. If you cannot avoid being in the sun, wear protective clothing and use sunscreen. Do not use sun lamps or tanning beds/booths. What side effects may I notice from receiving this medicine? Side effects that you should report to your doctor or health care professional as soon as possible:  allergic reactions like skin rash, itching or hives, swelling of the face, lips, or tongue  low blood counts - this medicine may decrease the number of white blood cells, red blood cells and platelets. You may be at increased risk for infections and bleeding.  signs of infection - fever or chills, cough, sore throat, pain or difficulty passing urine  signs of decreased platelets or bleeding - bruising, pinpoint red spots on the skin, black, tarry stools, blood in the urine  signs of decreased red blood cells - unusually weak or tired, fainting spells, lightheadedness  breathing problems  changes in vision  chest pain  mouth sores  nausea and vomiting  pain, swelling, redness at site where injected  pain, tingling, numbness in the hands or feet  redness, swelling, or sores on hands or feet  stomach pain  unusual bleeding Side effects that usually do not require medical attention (report to your doctor or health care professional if they continue or are  bothersome):  changes in finger or toe nails  diarrhea  dry or itchy skin  hair loss  headache  loss of appetite  sensitivity of eyes to the light  stomach upset  unusually teary eyes This list may not describe all possible side effects. Call your doctor for medical advice about side effects. You may report side effects to FDA at 1-800-FDA-1088. Where should I keep my medicine? This drug is given in a hospital or clinic and will not be stored at home. NOTE: This sheet is a summary. It may  not cover all possible information. If you have questions about this medicine, talk to your doctor, pharmacist, or health care provider.  2020 Elsevier/Gold Standard (2008-01-09 13:53:16)

## 2020-09-15 NOTE — Progress Notes (Signed)
Met w/ pt to introduce myself as his Arboriculturist.  Pt has 2 insurances so copay assistance shouldn't be needed.  I offered the [employer], went over what it covers, gave him the income requirement and an expense sheet.  He would like to apply so he will bring his proof of income to his next visit.  He has my card for any questions or concerns he may have in the future.

## 2020-09-16 ENCOUNTER — Inpatient Hospital Stay: Payer: Medicare Other

## 2020-09-16 ENCOUNTER — Telehealth: Payer: Self-pay | Admitting: *Deleted

## 2020-09-16 VITALS — BP 135/66 | HR 95 | Temp 98.5°F | Resp 16

## 2020-09-16 DIAGNOSIS — C20 Malignant neoplasm of rectum: Secondary | ICD-10-CM

## 2020-09-16 DIAGNOSIS — Z5111 Encounter for antineoplastic chemotherapy: Secondary | ICD-10-CM | POA: Diagnosis not present

## 2020-09-16 MED ORDER — SODIUM CHLORIDE 0.9% FLUSH
10.0000 mL | INTRAVENOUS | Status: DC | PRN
  Administered 2020-09-16: 17:00:00 10 mL
  Filled 2020-09-16: qty 10

## 2020-09-16 MED ORDER — HEPARIN SOD (PORK) LOCK FLUSH 100 UNIT/ML IV SOLN
500.0000 [IU] | Freq: Once | INTRAVENOUS | Status: AC | PRN
  Administered 2020-09-16: 17:00:00 500 [IU]
  Filled 2020-09-16: qty 5

## 2020-09-16 NOTE — Patient Instructions (Signed)

## 2020-09-16 NOTE — Telephone Encounter (Signed)
Patient reporting he goes to [facility] at 0900 tomorrow for MRI and to see Dr. Byrd Hesselbach and does not want to reschedule. Has 5FU infusion pump running until 3:15 tomorrow. Per Dr. Truett Perna, OK to come in today at 5:15 pm and discontinue his chemo pump today. Patient agrees to this plan.

## 2020-09-17 ENCOUNTER — Inpatient Hospital Stay: Payer: Medicare Other

## 2020-09-21 ENCOUNTER — Telehealth: Payer: Self-pay | Admitting: Oncology

## 2020-09-21 NOTE — Telephone Encounter (Signed)
Rescheduled 1/25 appointments per MD PAL request. Called patient, no answer. Left message about updated appointments times.

## 2020-09-28 ENCOUNTER — Other Ambulatory Visit: Payer: Self-pay | Admitting: Oncology

## 2020-09-29 ENCOUNTER — Inpatient Hospital Stay: Payer: Medicare Other | Attending: Oncology

## 2020-09-29 ENCOUNTER — Encounter: Payer: Self-pay | Admitting: Nurse Practitioner

## 2020-09-29 ENCOUNTER — Telehealth: Payer: Self-pay

## 2020-09-29 ENCOUNTER — Other Ambulatory Visit: Payer: Self-pay

## 2020-09-29 ENCOUNTER — Inpatient Hospital Stay: Payer: Medicare Other

## 2020-09-29 ENCOUNTER — Inpatient Hospital Stay (HOSPITAL_BASED_OUTPATIENT_CLINIC_OR_DEPARTMENT_OTHER): Payer: Medicare Other | Admitting: Nurse Practitioner

## 2020-09-29 VITALS — BP 140/67 | HR 87 | Temp 98.1°F | Resp 18 | Ht 69.0 in | Wt 193.7 lb

## 2020-09-29 DIAGNOSIS — Z86010 Personal history of colonic polyps: Secondary | ICD-10-CM | POA: Insufficient documentation

## 2020-09-29 DIAGNOSIS — C20 Malignant neoplasm of rectum: Secondary | ICD-10-CM | POA: Diagnosis not present

## 2020-09-29 DIAGNOSIS — G4730 Sleep apnea, unspecified: Secondary | ICD-10-CM | POA: Insufficient documentation

## 2020-09-29 DIAGNOSIS — Z5111 Encounter for antineoplastic chemotherapy: Secondary | ICD-10-CM | POA: Insufficient documentation

## 2020-09-29 DIAGNOSIS — Z87442 Personal history of urinary calculi: Secondary | ICD-10-CM | POA: Insufficient documentation

## 2020-09-29 DIAGNOSIS — I10 Essential (primary) hypertension: Secondary | ICD-10-CM | POA: Diagnosis not present

## 2020-09-29 DIAGNOSIS — K439 Ventral hernia without obstruction or gangrene: Secondary | ICD-10-CM | POA: Diagnosis not present

## 2020-09-29 DIAGNOSIS — N289 Disorder of kidney and ureter, unspecified: Secondary | ICD-10-CM | POA: Insufficient documentation

## 2020-09-29 DIAGNOSIS — Z95828 Presence of other vascular implants and grafts: Secondary | ICD-10-CM

## 2020-09-29 LAB — CBC WITH DIFFERENTIAL (CANCER CENTER ONLY)
Abs Immature Granulocytes: 0.01 10*3/uL (ref 0.00–0.07)
Basophils Absolute: 0.1 10*3/uL (ref 0.0–0.1)
Basophils Relative: 1 %
Eosinophils Absolute: 0.2 10*3/uL (ref 0.0–0.5)
Eosinophils Relative: 3 %
HCT: 34.6 % — ABNORMAL LOW (ref 39.0–52.0)
Hemoglobin: 11.3 g/dL — ABNORMAL LOW (ref 13.0–17.0)
Immature Granulocytes: 0 %
Lymphocytes Relative: 25 %
Lymphs Abs: 1.4 10*3/uL (ref 0.7–4.0)
MCH: 26.8 pg (ref 26.0–34.0)
MCHC: 32.7 g/dL (ref 30.0–36.0)
MCV: 82 fL (ref 80.0–100.0)
Monocytes Absolute: 0.9 10*3/uL (ref 0.1–1.0)
Monocytes Relative: 16 %
Neutro Abs: 3.1 10*3/uL (ref 1.7–7.7)
Neutrophils Relative %: 55 %
Platelet Count: 236 10*3/uL (ref 150–400)
RBC: 4.22 MIL/uL (ref 4.22–5.81)
RDW: 15.1 % (ref 11.5–15.5)
WBC Count: 5.6 10*3/uL (ref 4.0–10.5)
nRBC: 0 % (ref 0.0–0.2)

## 2020-09-29 LAB — CMP (CANCER CENTER ONLY)
ALT: 15 U/L (ref 0–44)
AST: 17 U/L (ref 15–41)
Albumin: 3.3 g/dL — ABNORMAL LOW (ref 3.5–5.0)
Alkaline Phosphatase: 66 U/L (ref 38–126)
Anion gap: 9 (ref 5–15)
BUN: 21 mg/dL (ref 8–23)
CO2: 27 mmol/L (ref 22–32)
Calcium: 8.8 mg/dL — ABNORMAL LOW (ref 8.9–10.3)
Chloride: 103 mmol/L (ref 98–111)
Creatinine: 2.28 mg/dL — ABNORMAL HIGH (ref 0.61–1.24)
GFR, Estimated: 30 mL/min — ABNORMAL LOW (ref >60)
Glucose, Bld: 104 mg/dL — ABNORMAL HIGH (ref 70–99)
Potassium: 3.3 mmol/L — ABNORMAL LOW (ref 3.5–5.1)
Sodium: 139 mmol/L (ref 135–145)
Total Bilirubin: 0.3 mg/dL (ref 0.3–1.2)
Total Protein: 6.9 g/dL (ref 6.5–8.1)

## 2020-09-29 MED ORDER — DEXTROSE 5 % IV SOLN
Freq: Once | INTRAVENOUS | Status: AC
  Filled 2020-09-29: qty 250

## 2020-09-29 MED ORDER — SODIUM CHLORIDE 0.9% FLUSH
10.0000 mL | INTRAVENOUS | Status: DC | PRN
  Administered 2020-09-29: 10 mL via INTRAVENOUS
  Filled 2020-09-29: qty 10

## 2020-09-29 MED ORDER — SODIUM CHLORIDE 0.9 % IV SOLN
10.0000 mg | Freq: Once | INTRAVENOUS | Status: AC
  Administered 2020-09-29: 10 mg via INTRAVENOUS
  Filled 2020-09-29: qty 10

## 2020-09-29 MED ORDER — HEPARIN SOD (PORK) LOCK FLUSH 100 UNIT/ML IV SOLN
500.0000 [IU] | Freq: Once | INTRAVENOUS | Status: DC | PRN
  Filled 2020-09-29: qty 5

## 2020-09-29 MED ORDER — SODIUM CHLORIDE 0.9 % IV SOLN
2370.0000 mg/m2 | INTRAVENOUS | Status: DC
  Administered 2020-09-29: 5000 mg via INTRAVENOUS
  Filled 2020-09-29: qty 100

## 2020-09-29 MED ORDER — OXALIPLATIN CHEMO INJECTION 100 MG/20ML
65.0000 mg/m2 | Freq: Once | INTRAVENOUS | Status: AC
  Administered 2020-09-29: 140 mg via INTRAVENOUS
  Filled 2020-09-29: qty 10

## 2020-09-29 MED ORDER — FLUOROURACIL CHEMO INJECTION 2.5 GM/50ML
400.0000 mg/m2 | Freq: Once | INTRAVENOUS | Status: DC
  Filled 2020-09-29: qty 17

## 2020-09-29 MED ORDER — PALONOSETRON HCL INJECTION 0.25 MG/5ML
INTRAVENOUS | Status: AC
  Filled 2020-09-29: qty 5

## 2020-09-29 MED ORDER — SODIUM CHLORIDE 0.9% FLUSH
10.0000 mL | INTRAVENOUS | Status: DC | PRN
  Filled 2020-09-29: qty 10

## 2020-09-29 MED ORDER — LEUCOVORIN CALCIUM INJECTION 350 MG
400.0000 mg/m2 | Freq: Once | INTRAVENOUS | Status: AC
  Administered 2020-09-29: 848 mg via INTRAVENOUS
  Filled 2020-09-29: qty 42.4

## 2020-09-29 MED ORDER — PALONOSETRON HCL INJECTION 0.25 MG/5ML
0.2500 mg | Freq: Once | INTRAVENOUS | Status: AC
  Administered 2020-09-29: 0.25 mg via INTRAVENOUS

## 2020-09-29 NOTE — Progress Notes (Addendum)
  [place] OFFICE PROGRESS NOTE   Diagnosis: Rectal cancer   INTERVAL HISTORY:   Mr. Hemmelgarn returns as scheduled.  He completed cycle 1 FOLFOX 09/15/2020.  He had mild nausea.  No vomiting. No mouth sores.  No diarrhea.  No significant issues with cold sensitivity.  Bowels are moving more consistently.  Minimal rectal bleeding.  Objective:  Vital signs in last 24 hours:  Blood pressure 140/67, pulse 87, temperature 98.1 F (36.7 C), temperature source Tympanic, resp. rate 18, height 5\' 9"  (1.753 m), weight 193 lb 11.2 oz (87.9 kg), SpO2 99 %.    HEENT: No thrush or ulcers. Resp: Lungs clear bilaterally. Cardio: Regular rate and rhythm. GI: Abdomen soft and nontender.  No hepatomegaly. Vascular: No leg edema.  Skin: Palms without erythema. Port-A-Cath without erythema.   Lab Results:  Lab Results  Component Value Date   WBC 7.9 09/15/2020   HGB 11.6 (L) 09/15/2020   HCT 36.3 (L) 09/15/2020   MCV 82.1 09/15/2020   PLT 220 09/15/2020   NEUTROABS 5.0 09/15/2020    Imaging:  No results found.  Medications: I have reviewed the patient's current medications.  Assessment/Plan: 1. Rectal cancer ? Rectal nodularity noted on colonoscopy 06/29/2020, removal of a rectal "polyp" revealed a tubulovillous adenoma with high-grade dysplasia ? Physical exam 08/04/2020-perianal mass invading the anal canal and sphincter complex-biopsy positive for mucinous adenocarcinoma ? CTs 08/17/2020-no rectal mass identified, multiple liver cyst and too small to characterize low-attenuation liver lesions, midline ventral hernia containing proximal right colon, groundglass left upper lobe nodule and nonspecific 3 mm right middle lobe nodule ? Cycle 1 FOLFOX 09/15/2020 ? MRI pelvis at [facility] 09/17/2020- anal and low rectal mass.  Internal and external sphincters involved by the mass.  Subtle extension of the tumor through the serosa of the low rectum.  No local regional  adenopathy. ? MRI liver at [facility] 09/17/2020- scattered hepatic cysts.  No evidence of metastatic disease. ? Cycle 2 FOLFOX 09/29/2020 2. Renal insufficiency 3. 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 Hypertension 5. History of kidney stones 6. Sleep apnea 7. Umbilical hernia repair 8. 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 Salvucci appears stable.  He has completed 1 cycle of FOLFOX.  Overall he tolerated well.  Plan to proceed with cycle 2 today as scheduled pending adequate labs.  Dr. Benay Spice reviewed the MRI results with him at today's appointment.  Plan is the same.  He will return for lab, follow-up, cycle 3 FOLFOX in 2 weeks.  He will contact the office in the interim with any problems.  Patient seen with Dr. Benay Spice.    Ned Card ANP/GNP-BC   09/29/2020  10:27 AM This was a shared visit with Ned Card.  I reviewed the pelvic and liver MRI findings with Mr. Broom.  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  An APR procedure will be required to potentially cure the cancer.  The plan is to complete total neoadjuvant therapy with the hope of increasing the cure rate and limiting the extent of surgery. He will complete cycle 2 FOLFOX chemotherapy today.  I performed the medical decision making at today's visit.  Julieanne Manson, MD

## 2020-09-29 NOTE — Patient Instructions (Signed)
[facility] Discharge Instructions for Patients Receiving Chemotherapy  Today you received the following chemotherapy agents: Oxaliplatin, Leucovorin, and Fluorouracil  To help prevent nausea and vomiting after your treatment, we encourage you to take your nausea medication as directed by your MD.   If you develop nausea and vomiting that is not controlled by your nausea medication, call the clinic.   BELOW ARE SYMPTOMS THAT SHOULD BE REPORTED IMMEDIATELY:  *FEVER GREATER THAN 100.5 F  *CHILLS WITH OR WITHOUT FEVER  NAUSEA AND VOMITING THAT IS NOT CONTROLLED WITH YOUR NAUSEA MEDICATION  *UNUSUAL SHORTNESS OF BREATH  *UNUSUAL BRUISING OR BLEEDING  TENDERNESS IN MOUTH AND THROAT WITH OR WITHOUT PRESENCE OF ULCERS  *URINARY PROBLEMS  *BOWEL PROBLEMS  UNUSUAL RASH Items with * indicate a potential emergency and should be followed up as soon as possible.  Feel free to call the clinic should you have any questions or concerns. The clinic phone number is [phone].  Please show the CHEMO ALERT CARD at check-in to the Emergency Department and triage nurse.  [facility] Discharge Instructions for Patients receiving Home Portable Chemo Pump    **The bag should finish at 46 hours, 96 hours or 7 days. For example, if your pump is scheduled for 46 hours and it was put on at 4pm, it should finish at 2 pm the day it is scheduled to come off regardless of your appointment time.    Estimated time to finish   _________________________ (Have your nurse fill in)     ** if the display on your pump reads "Low Volume" and it is beeping, take the batteries out of the pump and come to the cancer center for it to be taken off.   **If the pump alarms go off prior to the pump reading "Low Volume" then call the [phone] and someone can assist you.  **If the plunger comes out and the bag fluid is running out, please use your chemo spill kit to clean up the  spill. Do not use paper towels or other house hold products.  ** If you have problems or questions regarding your pump, please call either the [phone] or the cancer center Monday-Friday 8:00am-4:30pm at [phone] and we will assist you.  If you are unable to get assistance then go to [facility] Emergency Room, ask the staff to contact the IV team for assistance.    

## 2020-09-29 NOTE — Telephone Encounter (Signed)
Ptcalle message left concerning potassium adjustment x 3 days then return to previous instructions

## 2020-09-29 NOTE — Telephone Encounter (Signed)
-----   Message from Owens Shark, NP sent at 09/29/2020  1:13 PM EST ----- Please let him know he has persistent hypokalemia.  He is currently taking K-Dur 20 meq daily.  Please have him increase to twice daily for 3 days and then resume once daily.

## 2020-09-29 NOTE — Progress Notes (Signed)
Per Dr. Benay Spice ok for treatment today with creatine 2.28.

## 2020-09-30 ENCOUNTER — Telehealth: Payer: Self-pay | Admitting: Nurse Practitioner

## 2020-09-30 ENCOUNTER — Telehealth: Payer: Self-pay | Admitting: *Deleted

## 2020-09-30 NOTE — Telephone Encounter (Signed)
Scheduled appointments per 1/11 los. Spoke to patient who is aware of appointments dates and times.  

## 2020-09-30 NOTE — Telephone Encounter (Signed)
Per Dr Benay Spice, no 5FU bolus necessary this treatment.

## 2020-10-01 ENCOUNTER — Other Ambulatory Visit: Payer: Self-pay

## 2020-10-01 ENCOUNTER — Inpatient Hospital Stay: Payer: Medicare Other

## 2020-10-01 VITALS — BP 154/74 | HR 83 | Resp 18

## 2020-10-01 DIAGNOSIS — C20 Malignant neoplasm of rectum: Secondary | ICD-10-CM

## 2020-10-01 DIAGNOSIS — Z5111 Encounter for antineoplastic chemotherapy: Secondary | ICD-10-CM | POA: Diagnosis not present

## 2020-10-01 MED ORDER — SODIUM CHLORIDE 0.9% FLUSH
10.0000 mL | INTRAVENOUS | Status: DC | PRN
  Administered 2020-10-01: 10 mL
  Filled 2020-10-01: qty 10

## 2020-10-01 MED ORDER — HEPARIN SOD (PORK) LOCK FLUSH 100 UNIT/ML IV SOLN
500.0000 [IU] | Freq: Once | INTRAVENOUS | Status: AC | PRN
  Administered 2020-10-01: 500 [IU]
  Filled 2020-10-01: qty 5

## 2020-10-07 ENCOUNTER — Other Ambulatory Visit: Payer: Self-pay | Admitting: Nurse Practitioner

## 2020-10-07 DIAGNOSIS — C20 Malignant neoplasm of rectum: Secondary | ICD-10-CM

## 2020-10-08 ENCOUNTER — Ambulatory Visit
Admission: RE | Admit: 2020-10-08 | Discharge: 2020-10-08 | Disposition: A | Discharge status: Home / Self Care | Payer: Medicare Other | Source: Ambulatory Visit | Attending: Nephrology | Admitting: Nephrology

## 2020-10-08 DIAGNOSIS — N1832 Chronic kidney disease, stage 3b: Secondary | ICD-10-CM

## 2020-10-11 ENCOUNTER — Other Ambulatory Visit: Payer: Self-pay | Admitting: Oncology

## 2020-10-11 NOTE — Progress Notes (Signed)
[facility]   [phone] [phone]   Clinic Follow up Note   Patient Care Team: Default, Provider, MD as PCP - General Jonnie Finner, RN as Oncology Nurse Navigator Ladell Pier, MD as Consulting Physician (Oncology) 10/13/2020  CHIEF COMPLAINT: Follow up rectal cancer   CURRENT THERAPY: FOLFOX q2 weeks   INTERVAL HISTORY: Mr. Brunsman returns for follow up and treatment as scheduled. He completed C2 FOLFOX on 09/29/20. Denies significant cold sensitivity, no neuropathy.  Eating and drinking well, no mucositis, energy is adequate to be active at home. Mild intermittent nausea is managed with medication.  Having a small regular BMs with occasional, "major movement" daily. Minimal rectal pain and bleeding slightly better.  Denies fever, chills, cough, chest pain, dyspnea, worsening leg edema or new concerns.    MEDICAL HISTORY:  Past Medical History:  Diagnosis Date  . Hypertension   . Rectal cancer ([place]) dx'd 07/2020  . testicular ca    XRT comp    SURGICAL HISTORY: Past Surgical History:  Procedure Laterality Date  . IR IMAGING GUIDED PORT INSERTION  09/08/2020  . RECTAL BIOPSY  2021    I have reviewed the social history and family history with the patient and they are unchanged from previous note.  ALLERGIES:  has no allergies on file.  MEDICATIONS:  Current Outpatient Medications  Medication Sig Dispense Refill  . amLODipine (NORVASC) 10 MG tablet Take 10 mg by mouth daily.    Marland Kitchen aspirin 81 MG EC tablet Take 81 mg by mouth once a week.    . chlorthalidone (HYGROTON) 25 MG tablet Take 25 mg by mouth daily.    . Cholecalciferol (D3-1000) 25 MCG (1000 UT) tablet Take 1,000 Units by mouth daily.    . cyanocobalamin 1000 MCG tablet Take 1,000 mcg by mouth daily.    Marland Kitchen lidocaine-prilocaine (EMLA) cream Apply to port site 1-2 hours prior to use 30 g 2  . Multiple Vitamin (MULTIVITAMIN) tablet Take 1 tablet by mouth daily.    .  potassium chloride SA (KLOR-CON) 20 MEQ tablet Take 1 tablet (20 mEq total) by mouth daily. 30 tablet 1  . prochlorperazine (COMPAZINE) 10 MG tablet Take 1 tablet (10 mg total) by mouth every 6 (six) hours as needed for nausea. 60 tablet 0   No current facility-administered medications for this visit.   Facility-Administered Medications Ordered in Other Visits  Medication Dose Route Frequency Provider Last Rate Last Admin  . fluorouracil (ADRUCIL) 5,000 mg in sodium chloride 0.9 % 150 mL chemo infusion  2,370 mg/m2 (Treatment Plan Recorded) Intravenous 1 day or 1 dose Ladell Pier, MD      . fluorouracil (ADRUCIL) chemo injection 850 mg  400 mg/m2 (Treatment Plan Recorded) Intravenous Once Ladell Pier, MD      . heparin lock flush 100 unit/mL  500 Units Intracatheter Once PRN Ladell Pier, MD      . leucovorin 848 mg in dextrose 5 % 250 mL infusion  400 mg/m2 (Treatment Plan Recorded) Intravenous Once Ladell Pier, MD 146 mL/hr at 10/13/20 1110 848 mg at 10/13/20 1110  . oxaliplatin (ELOXATIN) 140 mg in dextrose 5 % 500 mL chemo infusion  65 mg/m2 (Treatment Plan Recorded) Intravenous Once Ladell Pier, MD 264 mL/hr at 10/13/20 1106 140 mg at 10/13/20 1106  . sodium chloride flush (NS) 0.9 % injection 10 mL  10 mL Intracatheter PRN Ladell Pier, MD  PHYSICAL EXAMINATION: ECOG PERFORMANCE STATUS: 1 - Symptomatic but completely ambulatory  Vitals:   10/13/20 0904  BP: (!) 142/78  Pulse: 91  Resp: 16  Temp: (!) 97.5 F (36.4 C)  SpO2: 99%   Filed Weights   10/13/20 0904  Weight: 194 lb 11.2 oz (88.3 kg)    GENERAL:alert, no distress and comfortable SKIN: No rash EYES: sclera clear OROPHARYNX: No thrush or ulcers LUNGS:  normal breathing effort HEART: Trace bilateral ankle edema NEURO: alert & oriented x 3 with fluent speech, no focal motor deficits PAC without erythema   LABORATORY DATA:  I have reviewed the data as listed CBC Latest Ref Rng &  Units 10/13/2020 09/29/2020 09/15/2020  WBC 4.0 - 10.5 K/uL 6.1 5.6 7.9  Hemoglobin 13.0 - 17.0 g/dL 11.3(L) 11.3(L) 11.6(L)  Hematocrit 39.0 - 52.0 % 34.6(L) 34.6(L) 36.3(L)  Platelets 150 - 400 K/uL 173 236 220     CMP Latest Ref Rng & Units 10/13/2020 09/29/2020 09/15/2020  Glucose 70 - 99 mg/dL 94 104(H) 111(H)  BUN 8 - 23 mg/dL 28(H) 21 31(H)  Creatinine 0.61 - 1.24 mg/dL 2.25(H) 2.28(H) 2.25(H)  Sodium 135 - 145 mmol/L 139 139 139  Potassium 3.5 - 5.1 mmol/L 3.6 3.3(L) 3.3(L)  Chloride 98 - 111 mmol/L 102 103 101  CO2 22 - 32 mmol/L 29 27 32  Calcium 8.9 - 10.3 mg/dL 8.7(L) 8.8(L) 9.5  Total Protein 6.5 - 8.1 g/dL 6.7 6.9 7.4  Total Bilirubin 0.3 - 1.2 mg/dL 0.4 0.3 0.3  Alkaline Phos 38 - 126 U/L 75 66 67  AST 15 - 41 U/L 17 17 22   ALT 0 - 44 U/L 19 15 20       RADIOGRAPHIC STUDIES: I have personally reviewed the radiological images as listed and agreed with the findings in the report. No results found.   ASSESSMENT & PLAN:  1. Rectal cancer ? Rectal nodularity noted on colonoscopy 06/29/2020, removal of a rectal "polyp"revealed a tubulovillous adenoma with high-grade dysplasia ? Physical exam 08/04/2020-perianal mass invading the anal canal and sphincter complex-biopsy positive for mucinous adenocarcinoma ? CTs 08/17/2020-no rectal mass identified, multiple liver cyst and too small to characterize low-attenuation liver lesions, midline ventral hernia containing proximal right colon, groundglass left upper lobe nodule and nonspecific 3 mm right middle lobe nodule ? Cycle 1 FOLFOX 09/15/2020 ? [facility] 09/17/2020- anal and low rectal mass.  Internal and external sphincters involved by the mass.  Subtle extension of the tumor through the serosa of the low rectum.  No local regional adenopathy. ? [facility] liver [facility] 09/17/2020- scattered hepatic cysts.  No evidence of metastatic disease. ? Cycle 2 FOLFOX 09/29/2020 ? Cycle 3 FOLFOX 10/13/2020 2. Renal  insufficiency 3. 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 Hypertension 5. History of kidney stones 6. Sleep apnea 7. Umbilical hernia repair 8. Multiple polyps removed on colonoscopy 06/29/2020 including a benign serrated polyp, tubular adenomas, and a tubulovillous adenoma with high-grade dysplasia at the rectum  Disposition: Mr. Scarpulla appears stable.  He completed 2 cycles of neoadjuvant FOLFOX.  He tolerates treatment well, mild nausea well managed with supportive meds at home.  He is able to recover and function well. Bowel movements, rectal pain and bleeding are improving on chemo, suggesting a clinical response to treatment.   Labs reviewed, stable and adequate for cycle 3 FOLFOX as planned, no dosage adjustments. Evidently the 5FU bolus was not  given with C2, he will proceed with 5FU bolus today as ordered.  He will return for follow-up with Dr. Benay Spice and cycle 4 in 2 weeks.  Orders Placed This Encounter  Procedures  . CBC with Differential (Cancer Center Only)    Standing Status:   Future    Standing Expiration Date:   10/13/2021  . CMP ([place] only)    Standing Status:   Future    Standing Expiration Date:   10/13/2021  . CEA (IN HOUSE-[facility])    Standing Status:   Future    Standing Expiration Date:   10/13/2021    All questions were answered. The patient knows to call the clinic with any problems, questions or concerns. No barriers to learning were detected.     Alla Feeling, NP 10/13/20

## 2020-10-13 ENCOUNTER — Encounter: Payer: Self-pay | Admitting: Oncology

## 2020-10-13 ENCOUNTER — Other Ambulatory Visit: Payer: Medicare Other

## 2020-10-13 ENCOUNTER — Inpatient Hospital Stay: Payer: Medicare Other

## 2020-10-13 ENCOUNTER — Encounter: Payer: Self-pay | Admitting: Nurse Practitioner

## 2020-10-13 ENCOUNTER — Ambulatory Visit: Payer: Medicare Other | Admitting: Oncology

## 2020-10-13 ENCOUNTER — Other Ambulatory Visit: Payer: Self-pay

## 2020-10-13 ENCOUNTER — Ambulatory Visit: Payer: Medicare Other

## 2020-10-13 ENCOUNTER — Inpatient Hospital Stay (HOSPITAL_BASED_OUTPATIENT_CLINIC_OR_DEPARTMENT_OTHER): Payer: Medicare Other | Admitting: Nurse Practitioner

## 2020-10-13 DIAGNOSIS — C20 Malignant neoplasm of rectum: Secondary | ICD-10-CM

## 2020-10-13 DIAGNOSIS — Z95828 Presence of other vascular implants and grafts: Secondary | ICD-10-CM

## 2020-10-13 DIAGNOSIS — Z5111 Encounter for antineoplastic chemotherapy: Secondary | ICD-10-CM | POA: Diagnosis not present

## 2020-10-13 LAB — CMP (CANCER CENTER ONLY)
ALT: 19 U/L (ref 0–44)
AST: 17 U/L (ref 15–41)
Albumin: 3.3 g/dL — ABNORMAL LOW (ref 3.5–5.0)
Alkaline Phosphatase: 75 U/L (ref 38–126)
Anion gap: 8 (ref 5–15)
BUN: 28 mg/dL — ABNORMAL HIGH (ref 8–23)
CO2: 29 mmol/L (ref 22–32)
Calcium: 8.7 mg/dL — ABNORMAL LOW (ref 8.9–10.3)
Chloride: 102 mmol/L (ref 98–111)
Creatinine: 2.25 mg/dL — ABNORMAL HIGH (ref 0.61–1.24)
GFR, Estimated: 31 mL/min — ABNORMAL LOW (ref >60)
Glucose, Bld: 94 mg/dL (ref 70–99)
Potassium: 3.6 mmol/L (ref 3.5–5.1)
Sodium: 139 mmol/L (ref 135–145)
Total Bilirubin: 0.4 mg/dL (ref 0.3–1.2)
Total Protein: 6.7 g/dL (ref 6.5–8.1)

## 2020-10-13 LAB — CBC WITH DIFFERENTIAL (CANCER CENTER ONLY)
Abs Immature Granulocytes: 0.02 10*3/uL (ref 0.00–0.07)
Basophils Absolute: 0.1 10*3/uL (ref 0.0–0.1)
Basophils Relative: 1 %
Eosinophils Absolute: 0.2 10*3/uL (ref 0.0–0.5)
Eosinophils Relative: 3 %
HCT: 34.6 % — ABNORMAL LOW (ref 39.0–52.0)
Hemoglobin: 11.3 g/dL — ABNORMAL LOW (ref 13.0–17.0)
Immature Granulocytes: 0 %
Lymphocytes Relative: 30 %
Lymphs Abs: 1.8 10*3/uL (ref 0.7–4.0)
MCH: 27 pg (ref 26.0–34.0)
MCHC: 32.7 g/dL (ref 30.0–36.0)
MCV: 82.6 fL (ref 80.0–100.0)
Monocytes Absolute: 1.3 10*3/uL — ABNORMAL HIGH (ref 0.1–1.0)
Monocytes Relative: 21 %
Neutro Abs: 2.8 10*3/uL (ref 1.7–7.7)
Neutrophils Relative %: 45 %
Platelet Count: 173 10*3/uL (ref 150–400)
RBC: 4.19 MIL/uL — ABNORMAL LOW (ref 4.22–5.81)
RDW: 16.1 % — ABNORMAL HIGH (ref 11.5–15.5)
WBC Count: 6.1 10*3/uL (ref 4.0–10.5)
nRBC: 0 % (ref 0.0–0.2)

## 2020-10-13 MED ORDER — SODIUM CHLORIDE 0.9% FLUSH
10.0000 mL | INTRAVENOUS | Status: DC | PRN
  Filled 2020-10-13: qty 10

## 2020-10-13 MED ORDER — SODIUM CHLORIDE 0.9 % IV SOLN
2370.0000 mg/m2 | INTRAVENOUS | Status: DC
  Administered 2020-10-13: 5000 mg via INTRAVENOUS
  Filled 2020-10-13: qty 100

## 2020-10-13 MED ORDER — OXALIPLATIN CHEMO INJECTION 100 MG/20ML
65.0000 mg/m2 | Freq: Once | INTRAVENOUS | Status: AC
  Administered 2020-10-13: 140 mg via INTRAVENOUS
  Filled 2020-10-13: qty 28

## 2020-10-13 MED ORDER — DEXTROSE 5 % IV SOLN
Freq: Once | INTRAVENOUS | Status: AC
  Filled 2020-10-13: qty 250

## 2020-10-13 MED ORDER — LEUCOVORIN CALCIUM INJECTION 350 MG
400.0000 mg/m2 | Freq: Once | INTRAVENOUS | Status: AC
  Administered 2020-10-13: 848 mg via INTRAVENOUS
  Filled 2020-10-13: qty 42.4

## 2020-10-13 MED ORDER — SODIUM CHLORIDE 0.9 % IV SOLN
10.0000 mg | Freq: Once | INTRAVENOUS | Status: AC
  Administered 2020-10-13: 10 mg via INTRAVENOUS
  Filled 2020-10-13: qty 10

## 2020-10-13 MED ORDER — HEPARIN SOD (PORK) LOCK FLUSH 100 UNIT/ML IV SOLN
500.0000 [IU] | Freq: Once | INTRAVENOUS | Status: DC | PRN
  Filled 2020-10-13: qty 5

## 2020-10-13 MED ORDER — PALONOSETRON HCL INJECTION 0.25 MG/5ML
INTRAVENOUS | Status: AC
  Filled 2020-10-13: qty 5

## 2020-10-13 MED ORDER — PALONOSETRON HCL INJECTION 0.25 MG/5ML
0.2500 mg | Freq: Once | INTRAVENOUS | Status: AC
  Administered 2020-10-13: 0.25 mg via INTRAVENOUS

## 2020-10-13 MED ORDER — FLUOROURACIL CHEMO INJECTION 2.5 GM/50ML
400.0000 mg/m2 | Freq: Once | INTRAVENOUS | Status: AC
  Administered 2020-10-13: 850 mg via INTRAVENOUS
  Filled 2020-10-13: qty 17

## 2020-10-13 MED ORDER — SODIUM CHLORIDE 0.9% FLUSH
10.0000 mL | Freq: Once | INTRAVENOUS | Status: AC
  Administered 2020-10-13: 10 mL
  Filled 2020-10-13: qty 10

## 2020-10-13 MED ORDER — POTASSIUM CHLORIDE CRYS ER 20 MEQ PO TBCR: 20.0000 meq | EXTENDED_RELEASE_TABLET | Freq: Every day | ORAL | 1 refills | Status: DC

## 2020-10-13 NOTE — Progress Notes (Signed)
Continue 5Fu bolus this cycle per Lacie.  Raul Del Essig, Addy, BCPS, BCOP 10/13/2020 11:53 AM

## 2020-10-13 NOTE — Progress Notes (Signed)
Pt is approved for the $1000 [employer] grant.  

## 2020-10-13 NOTE — Progress Notes (Signed)
Per Cira Rue NP, ok for treatment today with creatine 2.25.

## 2020-10-13 NOTE — Patient Instructions (Signed)
[facility] Discharge Instructions for Patients Receiving Chemotherapy  Today you received the following chemotherapy agents: Oxaliplatin, Leucovrin, and Fluorouracil  To help prevent nausea and vomiting after your treatment, we encourage you to take your nausea medication as directed by your MD.   If you develop nausea and vomiting that is not controlled by your nausea medication, call the clinic.   BELOW ARE SYMPTOMS THAT SHOULD BE REPORTED IMMEDIATELY:  *FEVER GREATER THAN 100.5 F  *CHILLS WITH OR WITHOUT FEVER  NAUSEA AND VOMITING THAT IS NOT CONTROLLED WITH YOUR NAUSEA MEDICATION  *UNUSUAL SHORTNESS OF BREATH  *UNUSUAL BRUISING OR BLEEDING  TENDERNESS IN MOUTH AND THROAT WITH OR WITHOUT PRESENCE OF ULCERS  *URINARY PROBLEMS  *BOWEL PROBLEMS  UNUSUAL RASH Items with * indicate a potential emergency and should be followed up as soon as possible.  Feel free to call the clinic should you have any questions or concerns. The clinic phone number is [phone].  Please show the [facility] at check-in to the Emergency Department and triage nurse.  [facility] Discharge Instructions for Patients receiving Home Portable Chemo Pump    **The bag should finish at 46 hours, 96 hours or 7 days. For example, if your pump is scheduled for 46 hours and it was put on at 4pm, it should finish at 2 pm the day it is scheduled to come off regardless of your appointment time.    Estimated time to finish   _________________________ (Have your nurse fill in)     ** if the display on your pump reads "Low Volume" and it is beeping, take the batteries out of the pump and come to the cancer center for it to be taken off.   **If the pump alarms go off prior to the pump reading "Low Volume" then call the [phone] and someone can assist you.  **If the plunger comes out and the bag fluid is running out, please use your chemo spill kit to clean up the  spill. Do not use paper towels or other house hold products.  ** If you have problems or questions regarding your pump, please call either the [phone] or the cancer center Monday-Friday 8:00am-4:30pm at [phone] and we will assist you.  If you are unable to get assistance then go to [facility] Emergency Room, ask the staff to contact the IV team for assistance.

## 2020-10-14 ENCOUNTER — Telehealth: Payer: Self-pay | Admitting: Nurse Practitioner

## 2020-10-14 NOTE — Telephone Encounter (Signed)
Scheduled appts per 1/25 los. Pt to get updated appt calendar at next visit per appt notes.

## 2020-10-15 ENCOUNTER — Inpatient Hospital Stay: Payer: Medicare Other

## 2020-10-15 ENCOUNTER — Other Ambulatory Visit: Payer: Self-pay

## 2020-10-15 VITALS — BP 140/71 | HR 75 | Resp 18

## 2020-10-15 DIAGNOSIS — Z5111 Encounter for antineoplastic chemotherapy: Secondary | ICD-10-CM | POA: Diagnosis not present

## 2020-10-15 DIAGNOSIS — C20 Malignant neoplasm of rectum: Secondary | ICD-10-CM

## 2020-10-15 MED ORDER — SODIUM CHLORIDE 0.9% FLUSH
10.0000 mL | INTRAVENOUS | Status: DC | PRN
  Administered 2020-10-15: 10 mL
  Filled 2020-10-15: qty 10

## 2020-10-15 MED ORDER — HEPARIN SOD (PORK) LOCK FLUSH 100 UNIT/ML IV SOLN
500.0000 [IU] | Freq: Once | INTRAVENOUS | Status: AC | PRN
Start: 2020-10-15 — End: 2020-10-15
  Administered 2020-10-15: 500 [IU]
  Filled 2020-10-15: qty 5

## 2020-10-25 ENCOUNTER — Other Ambulatory Visit: Payer: Self-pay | Admitting: Oncology

## 2020-10-27 ENCOUNTER — Inpatient Hospital Stay: Payer: Medicare Other | Attending: Oncology

## 2020-10-27 ENCOUNTER — Inpatient Hospital Stay: Payer: Medicare Other

## 2020-10-27 ENCOUNTER — Other Ambulatory Visit: Payer: Self-pay

## 2020-10-27 ENCOUNTER — Inpatient Hospital Stay (HOSPITAL_BASED_OUTPATIENT_CLINIC_OR_DEPARTMENT_OTHER): Payer: Medicare Other | Admitting: Oncology

## 2020-10-27 VITALS — BP 146/71 | HR 91 | Temp 97.9°F | Resp 19 | Ht 69.0 in | Wt 190.8 lb

## 2020-10-27 DIAGNOSIS — Z5111 Encounter for antineoplastic chemotherapy: Secondary | ICD-10-CM | POA: Insufficient documentation

## 2020-10-27 DIAGNOSIS — Z8547 Personal history of malignant neoplasm of testis: Secondary | ICD-10-CM | POA: Diagnosis not present

## 2020-10-27 DIAGNOSIS — Z95828 Presence of other vascular implants and grafts: Secondary | ICD-10-CM

## 2020-10-27 DIAGNOSIS — N189 Chronic kidney disease, unspecified: Secondary | ICD-10-CM | POA: Insufficient documentation

## 2020-10-27 DIAGNOSIS — C20 Malignant neoplasm of rectum: Secondary | ICD-10-CM

## 2020-10-27 DIAGNOSIS — I129 Hypertensive chronic kidney disease with stage 1 through stage 4 chronic kidney disease, or unspecified chronic kidney disease: Secondary | ICD-10-CM | POA: Diagnosis not present

## 2020-10-27 DIAGNOSIS — G4730 Sleep apnea, unspecified: Secondary | ICD-10-CM | POA: Insufficient documentation

## 2020-10-27 DIAGNOSIS — Z87442 Personal history of urinary calculi: Secondary | ICD-10-CM | POA: Insufficient documentation

## 2020-10-27 LAB — CMP (CANCER CENTER ONLY)
ALT: 18 U/L (ref 0–44)
AST: 17 U/L (ref 15–41)
Albumin: 3.4 g/dL — ABNORMAL LOW (ref 3.5–5.0)
Alkaline Phosphatase: 75 U/L (ref 38–126)
Anion gap: 7 (ref 5–15)
BUN: 24 mg/dL — ABNORMAL HIGH (ref 8–23)
CO2: 29 mmol/L (ref 22–32)
Calcium: 8.9 mg/dL (ref 8.9–10.3)
Chloride: 101 mmol/L (ref 98–111)
Creatinine: 2.31 mg/dL — ABNORMAL HIGH (ref 0.61–1.24)
GFR, Estimated: 30 mL/min — ABNORMAL LOW (ref >60)
Glucose, Bld: 107 mg/dL — ABNORMAL HIGH (ref 70–99)
Potassium: 3.2 mmol/L — ABNORMAL LOW (ref 3.5–5.1)
Sodium: 137 mmol/L (ref 135–145)
Total Bilirubin: 0.3 mg/dL (ref 0.3–1.2)
Total Protein: 6.9 g/dL (ref 6.5–8.1)

## 2020-10-27 LAB — CBC WITH DIFFERENTIAL (CANCER CENTER ONLY)
Abs Immature Granulocytes: 0 10*3/uL (ref 0.00–0.07)
Basophils Absolute: 0 10*3/uL (ref 0.0–0.1)
Basophils Relative: 1 %
Eosinophils Absolute: 0.1 10*3/uL (ref 0.0–0.5)
Eosinophils Relative: 3 %
HCT: 33.2 % — ABNORMAL LOW (ref 39.0–52.0)
Hemoglobin: 10.9 g/dL — ABNORMAL LOW (ref 13.0–17.0)
Immature Granulocytes: 0 %
Lymphocytes Relative: 36 %
Lymphs Abs: 1.6 10*3/uL (ref 0.7–4.0)
MCH: 27.1 pg (ref 26.0–34.0)
MCHC: 32.8 g/dL (ref 30.0–36.0)
MCV: 82.6 fL (ref 80.0–100.0)
Monocytes Absolute: 1.1 10*3/uL — ABNORMAL HIGH (ref 0.1–1.0)
Monocytes Relative: 26 %
Neutro Abs: 1.5 10*3/uL — ABNORMAL LOW (ref 1.7–7.7)
Neutrophils Relative %: 34 %
Platelet Count: 191 10*3/uL (ref 150–400)
RBC: 4.02 MIL/uL — ABNORMAL LOW (ref 4.22–5.81)
RDW: 16.8 % — ABNORMAL HIGH (ref 11.5–15.5)
WBC Count: 4.4 10*3/uL (ref 4.0–10.5)
nRBC: 0 % (ref 0.0–0.2)

## 2020-10-27 MED ORDER — PROCHLORPERAZINE MALEATE 10 MG PO TABS: 10.0000 mg | ORAL_TABLET | Freq: Four times a day (QID) | ORAL | 1 refills | Status: DC | PRN

## 2020-10-27 MED ORDER — POTASSIUM CHLORIDE CRYS ER 20 MEQ PO TBCR: 20.0000 meq | EXTENDED_RELEASE_TABLET | Freq: Two times a day (BID) | ORAL | 1 refills | Status: DC

## 2020-10-27 MED ORDER — DEXTROSE 5 % IV SOLN
Freq: Once | INTRAVENOUS | Status: AC
  Filled 2020-10-27: qty 250

## 2020-10-27 MED ORDER — FLUOROURACIL CHEMO INJECTION 2.5 GM/50ML
400.0000 mg/m2 | Freq: Once | INTRAVENOUS | Status: AC
  Administered 2020-10-27: 850 mg via INTRAVENOUS
  Filled 2020-10-27: qty 17

## 2020-10-27 MED ORDER — LEUCOVORIN CALCIUM INJECTION 350 MG
400.0000 mg/m2 | Freq: Once | INTRAVENOUS | Status: AC
  Administered 2020-10-27: 848 mg via INTRAVENOUS
  Filled 2020-10-27: qty 42.4

## 2020-10-27 MED ORDER — SODIUM CHLORIDE 0.9 % IV SOLN: 2400.0000 mg/m2 | INTRAVENOUS | Status: DC

## 2020-10-27 MED ORDER — PALONOSETRON HCL INJECTION 0.25 MG/5ML
INTRAVENOUS | Status: AC
  Filled 2020-10-27: qty 5

## 2020-10-27 MED ORDER — SODIUM CHLORIDE 0.9% FLUSH
10.0000 mL | Freq: Once | INTRAVENOUS | Status: AC
  Administered 2020-10-27: 10 mL
  Filled 2020-10-27: qty 10

## 2020-10-27 MED ORDER — OXALIPLATIN CHEMO INJECTION 100 MG/20ML
65.0000 mg/m2 | Freq: Once | INTRAVENOUS | Status: AC
  Administered 2020-10-27: 140 mg via INTRAVENOUS
  Filled 2020-10-27: qty 20

## 2020-10-27 MED ORDER — SODIUM CHLORIDE 0.9 % IV SOLN
10.0000 mg | Freq: Once | INTRAVENOUS | Status: AC
  Administered 2020-10-27: 10 mg via INTRAVENOUS
  Filled 2020-10-27: qty 10

## 2020-10-27 MED ORDER — SODIUM CHLORIDE 0.9 % IV SOLN
2350.0000 mg/m2 | INTRAVENOUS | Status: AC
  Administered 2020-10-27: 5000 mg via INTRAVENOUS
  Filled 2020-10-27: qty 100

## 2020-10-27 MED ORDER — PALONOSETRON HCL INJECTION 0.25 MG/5ML
0.2500 mg | Freq: Once | INTRAVENOUS | Status: AC
  Administered 2020-10-27: 0.25 mg via INTRAVENOUS

## 2020-10-27 NOTE — Progress Notes (Signed)
[facility] OFFICE PROGRESS NOTE   Diagnosis: Rectal cancer  INTERVAL HISTORY:   Walter [name] returns as scheduled.  He completed another cycle of FOLFOX on 10/13/2020.  He reports intermittent pain with chewing following chemotherapy.  This has resolved.  No mouth sores or diarrhea.  He had mild nausea following chemotherapy.  Compazine helped.  No cold sensitivity or peripheral numbness.  Objective:  Vital signs in last 24 hours:  Blood pressure (!) 146/71, pulse 91, temperature 97.9 F (36.6 C), temperature source Tympanic, resp. rate 19, height 5\' 9"  (1.753 m), weight 190 lb 12.8 oz (86.5 kg), SpO2 100 %.    HEENT: No thrush or ulcers Resp: Lungs clear bilaterally Cardio: Regular rate and rhythm GI: No mass, nontender, no hepatosplenomegaly Vascular: No trace lower leg edema bilaterally    Portacath/PICC-without erythema  Lab Results:  Lab Results  Component Value Date   WBC 4.4 10/27/2020   HGB 10.9 (L) 10/27/2020   HCT 33.2 (L) 10/27/2020   MCV 82.6 10/27/2020   PLT 191 10/27/2020   NEUTROABS 1.5 (L) 10/27/2020    CMP  Lab Results  Component Value Date   NA 137 10/27/2020   K 3.2 (L) 10/27/2020   CL 101 10/27/2020   CO2 29 10/27/2020   GLUCOSE 107 (H) 10/27/2020   BUN 24 (H) 10/27/2020   CREATININE 2.31 (H) 10/27/2020   CALCIUM 8.9 10/27/2020   PROT 6.9 10/27/2020   ALBUMIN 3.4 (L) 10/27/2020   AST 17 10/27/2020   ALT 18 10/27/2020   ALKPHOS 75 10/27/2020   BILITOT 0.3 10/27/2020   GFRNONAA 30 (L) 10/27/2020    Lab Results  Component Value Date   CEA1 11.24 (H) 09/15/2020      Medications: I have reviewed the patient's current medications.   Assessment/Plan: 1. Rectal cancer ? Rectal nodularity noted on colonoscopy 06/29/2020, removal of a rectal "polyp"revealed a tubulovillous adenoma with high-grade dysplasia ? Physical exam 08/04/2020-perianal mass invading the anal canal and sphincter complex-biopsy positive for mucinous  adenocarcinoma ? CTs 08/17/2020-no rectal mass identified, multiple liver cyst and too small to characterize low-attenuation liver lesions, midline ventral hernia containing proximal right colon, groundglass left upper lobe nodule and nonspecific 3 mm right middle lobe nodule ? Cycle 1 FOLFOX 09/15/2020 ? [facility] pelvis [facility] 09/17/2020- anal and low rectal mass.  Internal and external sphincters involved by the mass.  Subtle extension of the tumor through the serosa of the low rectum.  No local regional adenopathy. ? MRI liver at [facility] 09/17/2020- scattered hepatic cysts.  No evidence of metastatic disease. ? Cycle 2 FOLFOX 09/29/2020 ? Cycle 3 FOLFOX 10/13/2020 ? Cycle 4 FOLFOX 10/27/2020-Udenyca 2. Renal insufficiency 3. 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 Hypertension 5. History of kidney stones 6. Sleep apnea 7. Umbilical hernia repair 8. 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  He has tolerated the chemotherapy well.  He has mild neutropenia today.  We discussed the risk of infection with further chemotherapy.  I recommend adding G-CSF with this cycle.  We reviewed potential toxicities associated with G-CSF including the chance of a rash, bone pain, and splenic rupture.  He agrees to proceed.  He will call for a fever or symptoms of infection.  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  Walter [name] will return for an office visit and cycle 5 chemotherapy in 2 weeks.  The potassium is mildly decreased.  He will increase the potassium supplement to twice daily.  Betsy Coder, MD  10/27/2020  11:39 AM

## 2020-10-27 NOTE — Patient Instructions (Signed)
[facility] Discharge Instructions for Patients Receiving Chemotherapy  Today you received the following chemotherapy agents leucovorin, oxaliplatin, 5FU.  To help prevent nausea and vomiting after your treatment, we encourage you to take your nausea medication as directed.   If you develop nausea and vomiting that is not controlled by your nausea medication, call the clinic.   BELOW ARE SYMPTOMS THAT SHOULD BE REPORTED IMMEDIATELY:  *FEVER GREATER THAN 100.5 F  *CHILLS WITH OR WITHOUT FEVER  NAUSEA AND VOMITING THAT IS NOT CONTROLLED WITH YOUR NAUSEA MEDICATION  *UNUSUAL SHORTNESS OF BREATH  *UNUSUAL BRUISING OR BLEEDING  TENDERNESS IN MOUTH AND THROAT WITH OR WITHOUT PRESENCE OF ULCERS  *URINARY PROBLEMS  *BOWEL PROBLEMS  UNUSUAL RASH Items with * indicate a potential emergency and should be followed up as soon as possible.  Feel free to call the clinic should you have any questions or concerns. The clinic phone number is [phone].  Please show the [place] at check-in to the Emergency Department and triage nurse.

## 2020-10-27 NOTE — Patient Instructions (Signed)
Per Dr. Benay Spice: You are a clinical stage IIb rectal cancer.

## 2020-10-27 NOTE — Progress Notes (Unsigned)
Per Dr. Benay Spice, ok to treat with creatinine 2.31

## 2020-10-27 NOTE — Addendum Note (Signed)
Addended by: Tania Ade on: 10/27/2020 02:04 PM   Modules accepted: Orders

## 2020-10-28 ENCOUNTER — Telehealth: Payer: Self-pay | Admitting: Oncology

## 2020-10-28 NOTE — Telephone Encounter (Signed)
Scheduled appointments per 2/8 los. Spoke to patient who is aware of appointments dates and times.

## 2020-10-29 ENCOUNTER — Inpatient Hospital Stay: Payer: Medicare Other

## 2020-10-29 ENCOUNTER — Other Ambulatory Visit: Payer: Self-pay

## 2020-10-29 VITALS — BP 145/71 | HR 88 | Resp 18

## 2020-10-29 DIAGNOSIS — C20 Malignant neoplasm of rectum: Secondary | ICD-10-CM

## 2020-10-29 DIAGNOSIS — Z5111 Encounter for antineoplastic chemotherapy: Secondary | ICD-10-CM | POA: Diagnosis not present

## 2020-10-29 MED ORDER — HEPARIN SOD (PORK) LOCK FLUSH 100 UNIT/ML IV SOLN
500.0000 [IU] | Freq: Once | INTRAVENOUS | Status: AC | PRN
  Administered 2020-10-29: 500 [IU]
  Filled 2020-10-29: qty 5

## 2020-10-29 MED ORDER — PEGFILGRASTIM-CBQV 6 MG/0.6ML ~~LOC~~ SOSY
6.0000 mg | PREFILLED_SYRINGE | Freq: Once | SUBCUTANEOUS | Status: AC
  Administered 2020-10-29: 6 mg via SUBCUTANEOUS

## 2020-10-29 MED ORDER — SODIUM CHLORIDE 0.9% FLUSH
10.0000 mL | INTRAVENOUS | Status: DC | PRN
  Administered 2020-10-29: 10 mL
  Filled 2020-10-29: qty 10

## 2020-10-29 MED ORDER — PEGFILGRASTIM-CBQV 6 MG/0.6ML ~~LOC~~ SOSY
PREFILLED_SYRINGE | SUBCUTANEOUS | Status: AC
  Filled 2020-10-29: qty 0.6

## 2020-10-29 NOTE — Patient Instructions (Signed)
Pegfilgrastim injection What is this medicine? PEGFILGRASTIM (PEG fil gra stim) is a long-acting granulocyte colony-stimulating factor that stimulates the growth of neutrophils, a type of white blood cell important in the body's fight against infection. It is used to reduce the incidence of fever and infection in patients with certain types of cancer who are receiving chemotherapy that affects the bone marrow, and to increase survival after being exposed to high doses of radiation. This medicine may be used for other purposes; ask your health care provider or pharmacist if you have questions. COMMON BRAND NAME(S): Fulphila, Neulasta, Nyvepria, UDENYCA, Ziextenzo What should I tell my health care provider before I take this medicine? They need to know if you have any of these conditions:  kidney disease  latex allergy  ongoing radiation therapy  sickle cell disease  skin reactions to acrylic adhesives (On-Body Injector only)  an unusual or allergic reaction to pegfilgrastim, filgrastim, other medicines, foods, dyes, or preservatives  pregnant or trying to get pregnant  breast-feeding How should I use this medicine? This medicine is for injection under the skin. If you get this medicine at home, you will be taught how to prepare and give the pre-filled syringe or how to use the On-body Injector. Refer to the patient Instructions for Use for detailed instructions. Use exactly as directed. Tell your healthcare provider immediately if you suspect that the On-body Injector may not have performed as intended or if you suspect the use of the On-body Injector resulted in a missed or partial dose. It is important that you put your used needles and syringes in a special sharps container. Do not put them in a trash can. If you do not have a sharps container, call your pharmacist or healthcare provider to get one. Talk to your pediatrician regarding the use of this medicine in children. While this drug  may be prescribed for selected conditions, precautions do apply. Overdosage: If you think you have taken too much of this medicine contact a poison control center or emergency room at once. NOTE: This medicine is only for you. Do not share this medicine with others. What if I miss a dose? It is important not to miss your dose. Call your doctor or health care professional if you miss your dose. If you miss a dose due to an On-body Injector failure or leakage, a new dose should be administered as soon as possible using a single prefilled syringe for manual use. What may interact with this medicine? Interactions have not been studied. This list may not describe all possible interactions. Give your health care provider a list of all the medicines, herbs, non-prescription drugs, or dietary supplements you use. Also tell them if you smoke, drink alcohol, or use illegal drugs. Some items may interact with your medicine. What should I watch for while using this medicine? Your condition will be monitored carefully while you are receiving this medicine. You may need blood work done while you are taking this medicine. Talk to your health care provider about your risk of cancer. You may be more at risk for certain types of cancer if you take this medicine. If you are going to need a MRI, CT scan, or other procedure, tell your doctor that you are using this medicine (On-Body Injector only). What side effects may I notice from receiving this medicine? Side effects that you should report to your doctor or health care professional as soon as possible:  allergic reactions (skin rash, itching or hives, swelling of   the face, lips, or tongue)  back pain  dizziness  fever  pain, redness, or irritation at site where injected  pinpoint red spots on the skin  red or dark-brown urine  shortness of breath or breathing problems  stomach or side pain, or pain at the shoulder  swelling  tiredness  trouble  passing urine or change in the amount of urine  unusual bruising or bleeding Side effects that usually do not require medical attention (report to your doctor or health care professional if they continue or are bothersome):  bone pain  muscle pain This list may not describe all possible side effects. Call your doctor for medical advice about side effects. You may report side effects to FDA at 1-800-FDA-1088. Where should I keep my medicine? Keep out of the reach of children. If you are using this medicine at home, you will be instructed on how to store it. Throw away any unused medicine after the expiration date on the label. NOTE: This sheet is a summary. It may not cover all possible information. If you have questions about this medicine, talk to your doctor, pharmacist, or health care provider.  2021 Elsevier/Gold Standard (2019-09-27 13:20:51)  

## 2020-11-08 ENCOUNTER — Other Ambulatory Visit: Payer: Self-pay | Admitting: Oncology

## 2020-11-08 ENCOUNTER — Inpatient Hospital Stay (HOSPITAL_COMMUNITY)
Admission: EM | Admit: 2020-11-08 | Discharge: 2020-11-13 | DRG: 872 | Disposition: A | Discharge status: Home / Self Care | Payer: Medicare Other | Attending: Internal Medicine | Admitting: Internal Medicine

## 2020-11-08 ENCOUNTER — Emergency Department (HOSPITAL_COMMUNITY): Payer: Medicare Other

## 2020-11-08 ENCOUNTER — Encounter (HOSPITAL_COMMUNITY): Payer: Self-pay | Admitting: Emergency Medicine

## 2020-11-08 ENCOUNTER — Other Ambulatory Visit: Payer: Self-pay

## 2020-11-08 DIAGNOSIS — T451X5A Adverse effect of antineoplastic and immunosuppressive drugs, initial encounter: Secondary | ICD-10-CM | POA: Diagnosis present

## 2020-11-08 DIAGNOSIS — N1832 Chronic kidney disease, stage 3b: Secondary | ICD-10-CM | POA: Diagnosis present

## 2020-11-08 DIAGNOSIS — C20 Malignant neoplasm of rectum: Secondary | ICD-10-CM | POA: Diagnosis present

## 2020-11-08 DIAGNOSIS — A419 Sepsis, unspecified organism: Secondary | ICD-10-CM | POA: Diagnosis not present

## 2020-11-08 DIAGNOSIS — N390 Urinary tract infection, site not specified: Secondary | ICD-10-CM | POA: Diagnosis not present

## 2020-11-08 DIAGNOSIS — A4151 Sepsis due to Escherichia coli [E. coli]: Principal | ICD-10-CM | POA: Diagnosis present

## 2020-11-08 DIAGNOSIS — G4730 Sleep apnea, unspecified: Secondary | ICD-10-CM | POA: Diagnosis present

## 2020-11-08 DIAGNOSIS — I129 Hypertensive chronic kidney disease with stage 1 through stage 4 chronic kidney disease, or unspecified chronic kidney disease: Secondary | ICD-10-CM | POA: Diagnosis present

## 2020-11-08 DIAGNOSIS — Z8249 Family history of ischemic heart disease and other diseases of the circulatory system: Secondary | ICD-10-CM

## 2020-11-08 DIAGNOSIS — Z7982 Long term (current) use of aspirin: Secondary | ICD-10-CM

## 2020-11-08 DIAGNOSIS — D631 Anemia in chronic kidney disease: Secondary | ICD-10-CM | POA: Diagnosis present

## 2020-11-08 DIAGNOSIS — Z79899 Other long term (current) drug therapy: Secondary | ICD-10-CM

## 2020-11-08 DIAGNOSIS — Y929 Unspecified place or not applicable: Secondary | ICD-10-CM

## 2020-11-08 DIAGNOSIS — Z923 Personal history of irradiation: Secondary | ICD-10-CM

## 2020-11-08 DIAGNOSIS — D72829 Elevated white blood cell count, unspecified: Secondary | ICD-10-CM

## 2020-11-08 DIAGNOSIS — D6481 Anemia due to antineoplastic chemotherapy: Secondary | ICD-10-CM | POA: Diagnosis present

## 2020-11-08 DIAGNOSIS — N3000 Acute cystitis without hematuria: Secondary | ICD-10-CM | POA: Diagnosis present

## 2020-11-08 DIAGNOSIS — D630 Anemia in neoplastic disease: Secondary | ICD-10-CM | POA: Diagnosis present

## 2020-11-08 DIAGNOSIS — D6959 Other secondary thrombocytopenia: Secondary | ICD-10-CM | POA: Diagnosis present

## 2020-11-08 DIAGNOSIS — Z8547 Personal history of malignant neoplasm of testis: Secondary | ICD-10-CM

## 2020-11-08 DIAGNOSIS — R000 Tachycardia, unspecified: Secondary | ICD-10-CM

## 2020-11-08 DIAGNOSIS — Z20822 Contact with and (suspected) exposure to covid-19: Secondary | ICD-10-CM | POA: Diagnosis present

## 2020-11-08 DIAGNOSIS — I10 Essential (primary) hypertension: Secondary | ICD-10-CM | POA: Diagnosis present

## 2020-11-08 DIAGNOSIS — E876 Hypokalemia: Secondary | ICD-10-CM | POA: Diagnosis present

## 2020-11-08 DIAGNOSIS — N189 Chronic kidney disease, unspecified: Secondary | ICD-10-CM | POA: Diagnosis present

## 2020-11-08 DIAGNOSIS — Z87442 Personal history of urinary calculi: Secondary | ICD-10-CM

## 2020-11-08 LAB — CBC WITH DIFFERENTIAL/PLATELET
Abs Immature Granulocytes: 2.4 10*3/uL — ABNORMAL HIGH (ref 0.00–0.07)
Basophils Absolute: 0.2 10*3/uL — ABNORMAL HIGH (ref 0.0–0.1)
Basophils Relative: 1 %
Eosinophils Absolute: 0.1 10*3/uL (ref 0.0–0.5)
Eosinophils Relative: 0 %
HCT: 33.9 % — ABNORMAL LOW (ref 39.0–52.0)
Hemoglobin: 11.2 g/dL — ABNORMAL LOW (ref 13.0–17.0)
Immature Granulocytes: 8 %
Lymphocytes Relative: 9 %
Lymphs Abs: 2.6 10*3/uL (ref 0.7–4.0)
MCH: 27.8 pg (ref 26.0–34.0)
MCHC: 33 g/dL (ref 30.0–36.0)
MCV: 84.1 fL (ref 80.0–100.0)
Monocytes Absolute: 4.2 10*3/uL — ABNORMAL HIGH (ref 0.1–1.0)
Monocytes Relative: 15 %
Neutro Abs: 19.2 10*3/uL — ABNORMAL HIGH (ref 1.7–7.7)
Neutrophils Relative %: 67 %
Platelets: 152 10*3/uL (ref 150–400)
RBC: 4.03 MIL/uL — ABNORMAL LOW (ref 4.22–5.81)
RDW: 18.7 % — ABNORMAL HIGH (ref 11.5–15.5)
WBC: 28.5 10*3/uL — ABNORMAL HIGH (ref 4.0–10.5)
nRBC: 1.5 % — ABNORMAL HIGH (ref 0.0–0.2)

## 2020-11-08 LAB — COMPREHENSIVE METABOLIC PANEL
ALT: 80 U/L — ABNORMAL HIGH (ref 0–44)
AST: 50 U/L — ABNORMAL HIGH (ref 15–41)
Albumin: 3.4 g/dL — ABNORMAL LOW (ref 3.5–5.0)
Alkaline Phosphatase: 141 U/L — ABNORMAL HIGH (ref 38–126)
Anion gap: 12 (ref 5–15)
BUN: 24 mg/dL — ABNORMAL HIGH (ref 8–23)
CO2: 24 mmol/L (ref 22–32)
Calcium: 8.5 mg/dL — ABNORMAL LOW (ref 8.9–10.3)
Chloride: 97 mmol/L — ABNORMAL LOW (ref 98–111)
Creatinine, Ser: 2.29 mg/dL — ABNORMAL HIGH (ref 0.61–1.24)
GFR, Estimated: 30 mL/min — ABNORMAL LOW (ref >60)
Glucose, Bld: 118 mg/dL — ABNORMAL HIGH (ref 70–99)
Potassium: 3.1 mmol/L — ABNORMAL LOW (ref 3.5–5.1)
Sodium: 133 mmol/L — ABNORMAL LOW (ref 135–145)
Total Bilirubin: 0.6 mg/dL (ref 0.3–1.2)
Total Protein: 6.6 g/dL (ref 6.5–8.1)

## 2020-11-08 LAB — URINALYSIS, ROUTINE W REFLEX MICROSCOPIC
Bilirubin Urine: NEGATIVE
Glucose, UA: NEGATIVE mg/dL
Ketones, ur: NEGATIVE mg/dL
Nitrite: NEGATIVE
Protein, ur: NEGATIVE mg/dL
Specific Gravity, Urine: 1.005 (ref 1.005–1.030)
WBC, UA: >50 WBC/hpf — ABNORMAL HIGH (ref 0–5)
pH: 5 (ref 5.0–8.0)

## 2020-11-08 LAB — PROTIME-INR
INR: 1.3 — ABNORMAL HIGH (ref 0.8–1.2)
Prothrombin Time: 15.6 seconds — ABNORMAL HIGH (ref 11.4–15.2)

## 2020-11-08 LAB — LACTIC ACID, PLASMA: Lactic Acid, Venous: 1.1 mmol/L (ref 0.5–1.9)

## 2020-11-08 LAB — APTT: aPTT: 37 seconds — ABNORMAL HIGH (ref 24–36)

## 2020-11-08 MED ORDER — SODIUM CHLORIDE 0.9 % IV SOLN
2.0000 g | Freq: Once | INTRAVENOUS | Status: AC
  Administered 2020-11-08: 2 g via INTRAVENOUS
  Filled 2020-11-08: qty 2

## 2020-11-08 MED ORDER — LACTATED RINGERS IV SOLN: INTRAVENOUS | Status: DC

## 2020-11-08 MED ORDER — METRONIDAZOLE IN NACL 5-0.79 MG/ML-% IV SOLN
500.0000 mg | Freq: Once | INTRAVENOUS | Status: AC
  Administered 2020-11-08: 500 mg via INTRAVENOUS
  Filled 2020-11-08: qty 100

## 2020-11-08 MED ORDER — ACETAMINOPHEN 325 MG PO TABS
650.0000 mg | ORAL_TABLET | Freq: Once | ORAL | Status: AC
  Administered 2020-11-08: 650 mg via ORAL
  Filled 2020-11-08: qty 2

## 2020-11-08 MED ORDER — LACTATED RINGERS IV BOLUS
2000.0000 mL | Freq: Once | INTRAVENOUS | Status: AC
  Administered 2020-11-08: 2000 mL via INTRAVENOUS

## 2020-11-08 MED ORDER — VANCOMYCIN HCL IN DEXTROSE 1-5 GM/200ML-% IV SOLN
1000.0000 mg | Freq: Once | INTRAVENOUS | Status: AC
  Administered 2020-11-08: 1000 mg via INTRAVENOUS
  Filled 2020-11-08: qty 200

## 2020-11-08 MED ORDER — SODIUM CHLORIDE 0.9 % IV BOLUS
1000.0000 mL | Freq: Once | INTRAVENOUS | Status: AC
  Administered 2020-11-08: 1000 mL via INTRAVENOUS

## 2020-11-08 NOTE — Progress Notes (Signed)
[facility] following for sepsis protocol. 

## 2020-11-08 NOTE — H&P (Signed)
History and Physical    Walter [name] LXB:[id number] DOB: December 12, 1950 DOA: 11/08/2020  PCP: Default, Provider, MD  Patient coming from: Home  I have personally briefly reviewed patient's old medical records in [place]  Chief Complaint: Weakness, dysuria  HPI: Walter [name] is a 70 y.o. male with medical history significant for rectal adenocarcinoma undergoing chemotherapy with FOLFOX s/p cycle 4 on 10/29/2020, hypertension, CKD stage III/IV who presents to the ED for evaluation of dysuria and weakness.  Patient states he completed his last chemotherapy 10/29/2020 and responded similar to his prior sessions.  Over the last few days he has just become generally weak with some episodes of nausea.  He has had increased urinary frequency with dysuria.  He has had intermittent palpitations but denies any chest pain.  He has occasional shortness of breath without cough.  He has seen some swelling in his legs which he feels is improving now compared to prior.  ED Course:  Initial vitals showed BP 155/86, pulse 133, RR 27, temp 98.7 F, SPO2 97% on room air.  Labs show WBC 28.5, hemoglobin 11.2, platelets 152,000, sodium 133, potassium 3.1, bicarb 24, BUN 24, creatinine 2.29, serum glucose 118, AST 50, ALT 80, alk phos 141, total bilirubin 0.6, lactic acid 1.1.  Urinalysis shows negative nitrites, moderate leukocytes, 0-5 RBC/hpf, >50 WBC/hpf, rare bacteria microscopy.  Urine and blood cultures obtained and pending.  SARS-CoV-2 PCR panel in process.  Portable chest x-ray is negative for focal consolidation, edema, or effusion.  Right-sided Port-A-Cath in place.  Patient was given 2 L LR, 1 L NS, IV vancomycin metronidazole, and cefepime.  The hospitalist service was consulted to admit for further evaluation and management.  Review of Systems: All systems reviewed and are negative except as documented in history of present illness above.   Past Medical History:  Diagnosis Date  .  Hypertension   . Rectal cancer ([place]) dx'd 07/2020  . testicular ca    XRT comp    Past Surgical History:  Procedure Laterality Date  . IR IMAGING GUIDED PORT INSERTION  09/08/2020  . RECTAL BIOPSY  2021    Social History:  reports that he has never smoked. He has never used smokeless tobacco. He reports previous drug use. He reports that he does not drink alcohol.  No Known Allergies  Family History  Problem Relation Age of Onset  . Hypertension Father   . Cancer Brother      Prior to Admission medications   Medication Sig Start Date End Date Taking? Authorizing Provider  acetaminophen (TYLENOL) 500 MG tablet Take 500 mg by mouth 2 (two) times daily.   Yes [provider]  amLODipine (NORVASC) 10 MG tablet Take 10 mg by mouth daily. 07/09/20  Yes [provider]  aspirin 81 MG EC tablet Take 81 mg by mouth every Monday.   Yes [provider]  chlorthalidone (HYGROTON) 25 MG tablet Take 25 mg by mouth daily. 07/10/20  Yes [provider]  Cholecalciferol (D3-1000 PO) Take 1 tablet by mouth daily.   Yes [provider]  Cyanocobalamin (VITAMIN B-12) 5000 MCG SUBL Place 5,000 mcg under the tongue 2 (two) times daily.   Yes [provider]  lidocaine-prilocaine (EMLA) cream Apply to port site 1-2 hours prior to use Patient taking differently: Apply 1 application topically once as needed (1-2 hours prior to port access). 09/07/20  Yes Owens Shark, NP  Multiple Vitamin (MULTIVITAMIN WITH MINERALS) TABS tablet Take 1 tablet by  mouth every Monday.   Yes [provider]  potassium chloride SA (KLOR-CON) 20 MEQ tablet Take 1 tablet (20 mEq total) by mouth 2 (two) times daily. 10/27/20  Yes Ladell Pier, MD  prochlorperazine (COMPAZINE) 10 MG tablet Take 1 tablet (10 mg total) by mouth every 6 (six) hours as needed for nausea. Patient taking differently: Take 10 mg by mouth See admin instructions. Take one tablet (10 mg) by  mouth twice daily, may also take one tablet (10 mg) midday as needed for nausea/vomiting 10/27/20  Yes Ladell Pier, MD    Physical Exam: Vitals:   11/08/20 2041 11/08/20 2045 11/08/20 2115 11/08/20 2145  BP: (!) 157/85 (!) 169/90 (!) 157/83 (!) 133/58  Pulse: (!) 135 (!) 131 (!) 122 (!) 115  Resp: (!) 28 (!) 35 (!) 30 (!) 25  Temp:      TempSrc:      SpO2: 96% 96% 94% 92%   Constitutional: Resting in bed, NAD, calm, comfortable Eyes: PERRL, lids and conjunctivae normal ENMT: Mucous membranes are moist. Posterior pharynx clear of any exudate or lesions.Normal dentition.  Neck: normal, supple, no masses. Respiratory: clear to auscultation bilaterally, no wheezing, no crackles. Normal respiratory effort. No accessory muscle use.  Cardiovascular: Tachycardic, no murmurs / rubs / gallops.  Trace lower extremity edema, left greater than right. 2+ pedal pulses.  Port-A-Cath in place right chest wall. Abdomen: no tenderness, no masses palpated. No hepatosplenomegaly.  Musculoskeletal: no clubbing / cyanosis. No joint deformity upper and lower extremities. Good ROM, no contractures. Normal muscle tone.  Skin: no rashes, lesions, ulcers. No induration Neurologic: CN 2-12 grossly intact. Sensation intact. Strength 5/5 in all 4.  Psychiatric: Normal judgment and insight. Alert and oriented x 3. Normal mood.   Labs on Admission: I have personally reviewed following labs and imaging studies  CBC: Recent Labs  Lab 11/08/20 1945  WBC 28.5*  NEUTROABS 19.2*  HGB 11.2*  HCT 33.9*  MCV 84.1  PLT 962   Basic Metabolic Panel: Recent Labs  Lab 11/08/20 1945  NA 133*  K 3.1*  CL 97*  CO2 24  GLUCOSE 118*  BUN 24*  CREATININE 2.29*  CALCIUM 8.5*   GFR: Estimated Creatinine Clearance: 32.7 mL/min (A) (by C-G formula based on SCr of 2.29 mg/dL (H)). Liver Function Tests: Recent Labs  Lab 11/08/20 1945  AST 50*  ALT 80*  ALKPHOS 141*  BILITOT 0.6  PROT 6.6  ALBUMIN 3.4*   No  results for input(s): LIPASE, AMYLASE in the last 168 hours. No results for input(s): AMMONIA in the last 168 hours. Coagulation Profile: Recent Labs  Lab 11/08/20 1945  INR 1.3*   Cardiac Enzymes: No results for input(s): CKTOTAL, CKMB, CKMBINDEX, TROPONINI in the last 168 hours. BNP (last 3 results) No results for input(s): PROBNP in the last 8760 hours. HbA1C: No results for input(s): HGBA1C in the last 72 hours. CBG: No results for input(s): GLUCAP in the last 168 hours. Lipid Profile: No results for input(s): CHOL, HDL, LDLCALC, TRIG, CHOLHDL, LDLDIRECT in the last 72 hours. Thyroid Function Tests: No results for input(s): TSH, T4TOTAL, FREET4, T3FREE, THYROIDAB in the last 72 hours. Anemia Panel: No results for input(s): VITAMINB12, FOLATE, FERRITIN, TIBC, IRON, RETICCTPCT in the last 72 hours. Urine analysis:    Component Value Date/Time   COLORURINE STRAW (A) 11/08/2020 1850   APPEARANCEUR CLEAR 11/08/2020 1850   LABSPEC 1.005 11/08/2020 1850   PHURINE 5.0 11/08/2020 1850   GLUCOSEU NEGATIVE 11/08/2020  [place] (A) 11/08/2020 1850   BILIRUBINUR NEGATIVE 11/08/2020 [place] 11/08/2020 1850   PROTEINUR NEGATIVE 11/08/2020 1850   NITRITE NEGATIVE 11/08/2020 1850   LEUKOCYTESUR MODERATE (A) 11/08/2020 1850    Radiological Exams on Admission: DG Chest Port 1 View  Result Date: 11/08/2020 CLINICAL DATA:  Questionable sepsis. EXAM: PORTABLE CHEST 1 VIEW COMPARISON:  None. FINDINGS: A right Port-A-Cath terminates in the central SVC. No pneumothorax. There is elevation left hemidiaphragm. The lungs are clear. The cardiomediastinal silhouette is unremarkable. No other acute abnormalities. IMPRESSION: No active disease. Electronically Signed   By: Dorise Bullion III M.D   On: 11/08/2020 19:22    EKG: Personally reviewed. Sinus tachycardia, rate 129.  No prior for comparison.  Assessment/Plan Principal Problem:   Sepsis due to urinary tract  infection ([place]) Active Problems:   Rectal adenocarcinoma ([facility])   Hypertension   CKD (chronic kidney disease)   Walter [name] is a 70 y.o. male with medical history significant for rectal adenocarcinoma undergoing chemotherapy with FOLFOX s/p cycle 4 on 10/29/2020, hypertension, CKD stage III/IV who is admitted with sepsis due to UTI.  Sepsis due to UTI, POA: Patient presenting with tachycardia, tachypnea, and urinalysis suggestive of UTI as infectious source.  He has had urinary symptoms.  Also with significant leukocytosis although some of this can be contributed to recent G-CSF treatment. -Narrow antibiotics to IV ceftriaxone -Follow blood and urine cultures -Continue IV fluid resuscitation overnight  Sinus tachycardia: Persistent sinus tachycardia despite IV fluid hydration.  Has had palpitations at home.  Concern for PE however cannot obtain CTA PE study due to renal dysfunction.  Will obtain lower extremity ultrasound at this time.  CKD stage III/IV: Chronic and at recent baseline which is borderline CKD stage III/IV.  Continue to monitor.  Hypokalemia: Supplement and check magnesium.  Hypertension: Resume home amlodipine and chlorthalidone.  Rectal adenocarcinoma undergoing chemotherapy: Follows with oncology, Dr. Benay Spice.  On active chemotherapy s/p cycle 4 FOLFOX 10/29/2020.  Given G-CSF with last treatment.  DVT prophylaxis: Subcutaneous heparin Code Status: DNI, confirmed with patient on admission Family Communication: Discussed with patient, he has discussed with family Disposition Plan: From home and likely discharge to home pending clinical progress Consults called: None Level of care: Med-Surg Admission status:  Status is: Observation  The patient remains OBS appropriate and will d/c before 2 midnights.  Dispo: The patient is from: Home              Anticipated d/c is to: Home              Anticipated d/c date is: 1 day              Patient currently is not  medically stable to d/c.   Difficult to place patient No  Zada Finders MD [facility]  If 7PM-7AM, please contact night-coverage [email or web address]  11/08/2020, 11:16 PM

## 2020-11-08 NOTE — ED Provider Notes (Signed)
[facility] DEPT Provider Note   CSN: [id number] Arrival date & time: 11/08/20  1815     History Chief Complaint  Patient presents with  . Urinary Frequency  . Fatigue    Walter [name] is a 70 y.o. male.  Patient complains of dysuria and frequency.  Patient has a history of rectal cancer that he is getting chemo for it.  Patient presents with no fever.  The history is provided by the patient and medical records.  Urinary Frequency This is a new problem. The current episode started 6 to 12 hours ago. The problem occurs constantly. The problem has not changed since onset.Pertinent negatives include no chest pain, no abdominal pain and no headaches. Nothing aggravates the symptoms. He has tried nothing for the symptoms. The treatment provided no relief.       Past Medical History:  Diagnosis Date  . Hypertension   . Rectal cancer ([place]) dx'd 07/2020  . testicular ca    XRT comp    Patient Active Problem List   Diagnosis Date Noted  . Sepsis due to urinary tract infection ([place]) 11/08/2020  . Hypertension   . Port-A-Cath in place 10/13/2020  . Rectal adenocarcinoma ([place]) 08/26/2020    Past Surgical History:  Procedure Laterality Date  . IR IMAGING GUIDED PORT INSERTION  09/08/2020  . RECTAL BIOPSY  2021       No family history on file.     Home Medications Prior to Admission medications   Medication Sig Start Date End Date Taking? Authorizing Provider  acetaminophen (TYLENOL) 500 MG tablet Take 500 mg by mouth 2 (two) times daily.   Yes [provider]  amLODipine (NORVASC) 10 MG tablet Take 10 mg by mouth daily. 07/09/20  Yes [provider]  aspirin 81 MG EC tablet Take 81 mg by mouth every Monday.   Yes [provider]  chlorthalidone (HYGROTON) 25 MG tablet Take 25 mg by mouth daily. 07/10/20  Yes [provider]  Cholecalciferol (D3-1000 PO) Take 1 tablet by mouth daily.   Yes [provider]  Cyanocobalamin (VITAMIN B-12) 5000 MCG SUBL Place 5,000 mcg under the tongue 2 (two) times daily.   Yes [provider]  lidocaine-prilocaine (EMLA) cream Apply to port site 1-2 hours prior to use Patient taking differently: Apply 1 application topically once as needed (1-2 hours prior to port access). 09/07/20  Yes Owens Shark, NP  Multiple Vitamin (MULTIVITAMIN WITH MINERALS) TABS tablet Take 1 tablet by mouth every Monday.   Yes [provider]  potassium chloride SA (KLOR-CON) 20 MEQ tablet Take 1 tablet (20 mEq total) by mouth 2 (two) times daily. 10/27/20  Yes Ladell Pier, MD  prochlorperazine (COMPAZINE) 10 MG tablet Take 1 tablet (10 mg total) by mouth every 6 (six) hours as needed for nausea. Patient taking differently: Take 10 mg by mouth See admin instructions. Take one tablet (10 mg) by mouth twice daily, may also take one tablet (10 mg) midday as needed for nausea/vomiting 10/27/20  Yes Ladell Pier, MD    Allergies    Patient has no known allergies.  Review of Systems   Review of Systems  Constitutional: Negative for appetite change and fatigue.  HENT: Negative for congestion, ear discharge and sinus pressure.   Eyes: Negative for discharge.  Respiratory: Negative for cough.   Cardiovascular: Negative for chest pain.  Gastrointestinal: Negative for abdominal pain and diarrhea.  Genitourinary: Positive for frequency. Negative for  hematuria.  Musculoskeletal: Negative for back pain.  Skin: Negative for rash.  Neurological: Negative for seizures and headaches.  Psychiatric/Behavioral: Negative for hallucinations.    Physical Exam Updated Vital Signs BP (!) 133/58   Pulse (!) 115   Temp 98.7 F (37.1 C) (Oral)   Resp (!) 25   SpO2 92%   Physical Exam Vitals and nursing note reviewed.  Constitutional:      Appearance: He is well-developed.  HENT:     Head: Normocephalic.     Nose: Nose normal.  Eyes:     General: No  scleral icterus.    Extraocular Movements: EOM normal.     Conjunctiva/sclera: Conjunctivae normal.  Neck:     Thyroid: No thyromegaly.  Cardiovascular:     Rate and Rhythm: Normal rate and regular rhythm.     Heart sounds: No murmur heard. No friction rub. No gallop.   Pulmonary:     Breath sounds: No stridor. No wheezing or rales.  Chest:     Chest wall: No tenderness.  Abdominal:     General: There is no distension.     Tenderness: There is no abdominal tenderness. There is no rebound.  Musculoskeletal:        General: No edema. Normal range of motion.     Cervical back: Neck supple.  Lymphadenopathy:     Cervical: No cervical adenopathy.  Skin:    Findings: No erythema or rash.  Neurological:     Mental Status: He is alert and oriented to person, place, and time.     Motor: No abnormal muscle tone.     Coordination: Coordination normal.  Psychiatric:        Mood and Affect: Mood and affect normal.        Behavior: Behavior normal.     ED Results / Procedures / Treatments   Labs (all labs ordered are listed, but only abnormal results are displayed) Labs Reviewed  COMPREHENSIVE METABOLIC PANEL - Abnormal; Notable for the following components:      Result Value   Sodium 133 (*)    Potassium 3.1 (*)    Chloride 97 (*)    Glucose, Bld 118 (*)    BUN 24 (*)    Creatinine, Ser 2.29 (*)    Calcium 8.5 (*)    Albumin 3.4 (*)    AST 50 (*)    ALT 80 (*)    Alkaline Phosphatase 141 (*)    GFR, Estimated 30 (*)    All other components within normal limits  CBC WITH DIFFERENTIAL/PLATELET - Abnormal; Notable for the following components:   WBC 28.5 (*)    RBC 4.03 (*)    Hemoglobin 11.2 (*)    HCT 33.9 (*)    RDW 18.7 (*)    nRBC 1.5 (*)    Neutro Abs 19.2 (*)    Monocytes Absolute 4.2 (*)    Basophils Absolute 0.2 (*)    Abs Immature Granulocytes 2.40 (*)    All other components within normal limits  PROTIME-INR - Abnormal; Notable for the following components:    Prothrombin Time 15.6 (*)    INR 1.3 (*)    All other components within normal limits  APTT - Abnormal; Notable for the following components:   aPTT 37 (*)    All other components within normal limits  URINALYSIS, ROUTINE W REFLEX MICROSCOPIC - Abnormal; Notable for the following components:   Color, Urine STRAW (*)    Hgb urine dipstick MODERATE (*)  Leukocytes,Ua MODERATE (*)    WBC, UA >50 (*)    Bacteria, UA RARE (*)    All other components within normal limits  RESP PANEL BY RT-PCR (FLU A&B, COVID) ARPGX2  CULTURE, BLOOD (ROUTINE X 2)  CULTURE, BLOOD (ROUTINE X 2)  URINE CULTURE  LACTIC ACID, PLASMA  LACTIC ACID, PLASMA    EKG None  Radiology DG Chest Port 1 View  Result Date: 11/08/2020 CLINICAL DATA:  Questionable sepsis. EXAM: PORTABLE CHEST 1 VIEW COMPARISON:  None. FINDINGS: A right Port-A-Cath terminates in the central SVC. No pneumothorax. There is elevation left hemidiaphragm. The lungs are clear. The cardiomediastinal silhouette is unremarkable. No other acute abnormalities. IMPRESSION: No active disease. Electronically Signed   By: Dorise Bullion III M.D   On: 11/08/2020 19:22    Procedures Procedures   Medications Ordered in ED Medications  lactated ringers infusion ( Intravenous New Bag/Given 11/08/20 2054)  ceFEPIme (MAXIPIME) 2 g in sodium chloride 0.9 % 100 mL IVPB (2 g Intravenous New Bag/Given 11/08/20 2256)  sodium chloride 0.9 % bolus 1,000 mL (has no administration in time range)  metroNIDAZOLE (FLAGYL) IVPB 500 mg (500 mg Intravenous New Bag/Given 11/08/20 2056)  vancomycin (VANCOCIN) IVPB 1000 mg/200 mL premix (0 mg Intravenous Stopped 11/08/20 2258)  lactated ringers bolus 2,000 mL (2,000 mLs Intravenous New Bag/Given 11/08/20 2103)  acetaminophen (TYLENOL) tablet 650 mg (650 mg Oral Given 11/08/20 2047)    ED Course  I have reviewed the triage vital signs and the nursing notes.  Pertinent labs & imaging results that were available during my  care of the patient were reviewed by me and considered in my medical decision making (see chart for details). .edcrt CRITICAL CARE Performed by: Milton Ferguson Total critical care time: 45 minutes Critical care time was exclusive of separately billable procedures and treating other patients. Critical care was necessary to treat or prevent imminent or life-threatening deterioration. Critical care was time spent personally by me on the following activities: development of treatment plan with patient and/or surrogate as well as nursing, discussions with consultants, evaluation of patient's response to treatment, examination of patient, obtaining history from patient or surrogate, ordering and performing treatments and interventions, ordering and review of laboratory studies, ordering and review of radiographic studies, pulse oximetry and re-evaluation of patient's condition.    MDM Rules/Calculators/A&P                          Patient with urinary tract infection tachycardia and possible sepsis he will be admitted to medicine for IV antibiotics with oncology consult Final Clinical Impression(s) / ED Diagnoses Final diagnoses:  Acute cystitis without hematuria    Rx / DC Orders ED Discharge Orders    None       Milton Ferguson, MD 11/10/20 1216

## 2020-11-08 NOTE — Progress Notes (Signed)
A consult was received from an ED physician for vancomycin and cefepime per pharmacy dosing.  The patient's profile has been reviewed for ht/wt/allergies/indication/available labs.    A one time order has been placed for vancomycin 1000 mg IV x1 and cefepime 2gm IV x1.  Further antibiotics/pharmacy consults should be ordered by admitting physician if indicated.                       Thank you, Lynelle Doctor 11/08/2020  6:55 PM

## 2020-11-08 NOTE — ED Triage Notes (Signed)
71 yo BIBA from home status post 4th chemo treatment c/o weakness, malaise, and urinary symptoms. Pt is complaining of increased pain upon urination as well as frequency. Pt also states he has been having some nausea today as well. No other complaints at this time. Pt has history of rectal cancer which he is currently getting treatment  For.   Vitals: bp 146/90 Hr 140 rr22 spo2 97% on ra cbg 141 Temp 100.4 temporal  Pt had 250 fluid bolus of ns 20 gauge left hand

## 2020-11-09 ENCOUNTER — Telehealth: Payer: Self-pay | Admitting: *Deleted

## 2020-11-09 ENCOUNTER — Ambulatory Visit (HOSPITAL_BASED_OUTPATIENT_CLINIC_OR_DEPARTMENT_OTHER): Payer: Medicare Other

## 2020-11-09 DIAGNOSIS — R609 Edema, unspecified: Secondary | ICD-10-CM | POA: Insufficient documentation

## 2020-11-09 DIAGNOSIS — I10 Essential (primary) hypertension: Secondary | ICD-10-CM | POA: Diagnosis not present

## 2020-11-09 DIAGNOSIS — G4730 Sleep apnea, unspecified: Secondary | ICD-10-CM | POA: Diagnosis present

## 2020-11-09 DIAGNOSIS — N3000 Acute cystitis without hematuria: Secondary | ICD-10-CM | POA: Diagnosis present

## 2020-11-09 DIAGNOSIS — A4151 Sepsis due to Escherichia coli [E. coli]: Secondary | ICD-10-CM | POA: Diagnosis present

## 2020-11-09 DIAGNOSIS — T451X5A Adverse effect of antineoplastic and immunosuppressive drugs, initial encounter: Secondary | ICD-10-CM | POA: Diagnosis present

## 2020-11-09 DIAGNOSIS — Z7982 Long term (current) use of aspirin: Secondary | ICD-10-CM | POA: Diagnosis not present

## 2020-11-09 DIAGNOSIS — I129 Hypertensive chronic kidney disease with stage 1 through stage 4 chronic kidney disease, or unspecified chronic kidney disease: Secondary | ICD-10-CM | POA: Diagnosis present

## 2020-11-09 DIAGNOSIS — N1832 Chronic kidney disease, stage 3b: Secondary | ICD-10-CM | POA: Diagnosis present

## 2020-11-09 DIAGNOSIS — E876 Hypokalemia: Secondary | ICD-10-CM | POA: Diagnosis present

## 2020-11-09 DIAGNOSIS — Z20822 Contact with and (suspected) exposure to covid-19: Secondary | ICD-10-CM | POA: Diagnosis present

## 2020-11-09 DIAGNOSIS — D6959 Other secondary thrombocytopenia: Secondary | ICD-10-CM | POA: Diagnosis present

## 2020-11-09 DIAGNOSIS — R000 Tachycardia, unspecified: Secondary | ICD-10-CM

## 2020-11-09 DIAGNOSIS — Z79899 Other long term (current) drug therapy: Secondary | ICD-10-CM | POA: Diagnosis not present

## 2020-11-09 DIAGNOSIS — D72829 Elevated white blood cell count, unspecified: Secondary | ICD-10-CM

## 2020-11-09 DIAGNOSIS — C20 Malignant neoplasm of rectum: Secondary | ICD-10-CM | POA: Diagnosis present

## 2020-11-09 DIAGNOSIS — Y929 Unspecified place or not applicable: Secondary | ICD-10-CM | POA: Diagnosis not present

## 2020-11-09 DIAGNOSIS — D6481 Anemia due to antineoplastic chemotherapy: Secondary | ICD-10-CM | POA: Diagnosis present

## 2020-11-09 DIAGNOSIS — D631 Anemia in chronic kidney disease: Secondary | ICD-10-CM | POA: Diagnosis present

## 2020-11-09 DIAGNOSIS — D630 Anemia in neoplastic disease: Secondary | ICD-10-CM | POA: Diagnosis present

## 2020-11-09 DIAGNOSIS — A419 Sepsis, unspecified organism: Secondary | ICD-10-CM | POA: Diagnosis not present

## 2020-11-09 DIAGNOSIS — Z8547 Personal history of malignant neoplasm of testis: Secondary | ICD-10-CM | POA: Diagnosis not present

## 2020-11-09 DIAGNOSIS — Z8249 Family history of ischemic heart disease and other diseases of the circulatory system: Secondary | ICD-10-CM | POA: Diagnosis not present

## 2020-11-09 DIAGNOSIS — N390 Urinary tract infection, site not specified: Secondary | ICD-10-CM | POA: Diagnosis not present

## 2020-11-09 DIAGNOSIS — Z87442 Personal history of urinary calculi: Secondary | ICD-10-CM | POA: Diagnosis not present

## 2020-11-09 DIAGNOSIS — Z923 Personal history of irradiation: Secondary | ICD-10-CM | POA: Diagnosis not present

## 2020-11-09 LAB — CBC
HCT: 33.4 % — ABNORMAL LOW (ref 39.0–52.0)
Hemoglobin: 11 g/dL — ABNORMAL LOW (ref 13.0–17.0)
MCH: 27.7 pg (ref 26.0–34.0)
MCHC: 32.9 g/dL (ref 30.0–36.0)
MCV: 84.1 fL (ref 80.0–100.0)
Platelets: 145 10*3/uL — ABNORMAL LOW (ref 150–400)
RBC: 3.97 MIL/uL — ABNORMAL LOW (ref 4.22–5.81)
RDW: 18.9 % — ABNORMAL HIGH (ref 11.5–15.5)
WBC: 29.3 10*3/uL — ABNORMAL HIGH (ref 4.0–10.5)
nRBC: 1 % — ABNORMAL HIGH (ref 0.0–0.2)

## 2020-11-09 LAB — MAGNESIUM: Magnesium: 1.4 mg/dL — ABNORMAL LOW (ref 1.7–2.4)

## 2020-11-09 LAB — COMPREHENSIVE METABOLIC PANEL
ALT: 70 U/L — ABNORMAL HIGH (ref 0–44)
AST: 49 U/L — ABNORMAL HIGH (ref 15–41)
Albumin: 3.2 g/dL — ABNORMAL LOW (ref 3.5–5.0)
Alkaline Phosphatase: 135 U/L — ABNORMAL HIGH (ref 38–126)
Anion gap: 11 (ref 5–15)
BUN: 21 mg/dL (ref 8–23)
CO2: 22 mmol/L (ref 22–32)
Calcium: 8.5 mg/dL — ABNORMAL LOW (ref 8.9–10.3)
Chloride: 103 mmol/L (ref 98–111)
Creatinine, Ser: 2.15 mg/dL — ABNORMAL HIGH (ref 0.61–1.24)
GFR, Estimated: 32 mL/min — ABNORMAL LOW (ref >60)
Glucose, Bld: 163 mg/dL — ABNORMAL HIGH (ref 70–99)
Potassium: 3.6 mmol/L (ref 3.5–5.1)
Sodium: 136 mmol/L (ref 135–145)
Total Bilirubin: 0.7 mg/dL (ref 0.3–1.2)
Total Protein: 6.4 g/dL — ABNORMAL LOW (ref 6.5–8.1)

## 2020-11-09 LAB — HIV ANTIBODY (ROUTINE TESTING W REFLEX): HIV Screen 4th Generation wRfx: NONREACTIVE

## 2020-11-09 MED ORDER — SODIUM CHLORIDE 0.9 % IV SOLN
2.0000 g | Freq: Two times a day (BID) | INTRAVENOUS | Status: DC
  Administered 2020-11-09 – 2020-11-11 (×4): 2 g via INTRAVENOUS
  Filled 2020-11-09 (×4): qty 2

## 2020-11-09 MED ORDER — SODIUM CHLORIDE 0.9 % IV SOLN
1.0000 g | INTRAVENOUS | Status: DC
  Administered 2020-11-09: 12:00:00 1 g via INTRAVENOUS
  Filled 2020-11-09: qty 1

## 2020-11-09 MED ORDER — MAGNESIUM SULFATE 2 GM/50ML IV SOLN
2.0000 g | Freq: Once | INTRAVENOUS | Status: AC
  Administered 2020-11-09: 11:00:00 2 g via INTRAVENOUS
  Filled 2020-11-09: qty 50

## 2020-11-09 MED ORDER — SODIUM CHLORIDE 0.9 % IV SOLN: INTRAVENOUS | Status: DC

## 2020-11-09 MED ORDER — HEPARIN SODIUM (PORCINE) 5000 UNIT/ML IJ SOLN
5000.0000 [IU] | Freq: Three times a day (TID) | INTRAMUSCULAR | Status: DC
  Administered 2020-11-09 – 2020-11-13 (×13): 5000 [IU] via SUBCUTANEOUS
  Filled 2020-11-09 (×14): qty 1

## 2020-11-09 MED ORDER — ONDANSETRON HCL 4 MG/2ML IJ SOLN
4.0000 mg | Freq: Four times a day (QID) | INTRAMUSCULAR | Status: DC | PRN
  Administered 2020-11-09 – 2020-11-11 (×4): 4 mg via INTRAVENOUS
  Filled 2020-11-09 (×4): qty 2

## 2020-11-09 MED ORDER — ACETAMINOPHEN 325 MG PO TABS
650.0000 mg | ORAL_TABLET | Freq: Four times a day (QID) | ORAL | Status: DC | PRN
  Administered 2020-11-09 – 2020-11-12 (×4): 650 mg via ORAL
  Filled 2020-11-09 (×4): qty 2

## 2020-11-09 MED ORDER — CHLORHEXIDINE GLUCONATE CLOTH 2 % EX PADS
6.0000 | MEDICATED_PAD | Freq: Every day | CUTANEOUS | Status: DC
  Administered 2020-11-09 – 2020-11-13 (×5): 6 via TOPICAL

## 2020-11-09 MED ORDER — AMLODIPINE BESYLATE 10 MG PO TABS
10.0000 mg | ORAL_TABLET | Freq: Every day | ORAL | Status: DC
  Administered 2020-11-09 – 2020-11-13 (×5): 10 mg via ORAL
  Filled 2020-11-09 (×5): qty 1

## 2020-11-09 MED ORDER — ACETAMINOPHEN 650 MG RE SUPP: 650.0000 mg | Freq: Four times a day (QID) | RECTAL | Status: DC | PRN

## 2020-11-09 MED ORDER — ONDANSETRON HCL 4 MG PO TABS
4.0000 mg | ORAL_TABLET | Freq: Four times a day (QID) | ORAL | Status: DC | PRN
  Filled 2020-11-09: qty 1

## 2020-11-09 MED ORDER — SODIUM CHLORIDE 0.9 % IV BOLUS
250.0000 mL | Freq: Once | INTRAVENOUS | Status: AC
  Administered 2020-11-09: 15:00:00 250 mL via INTRAVENOUS

## 2020-11-09 MED ORDER — CHLORTHALIDONE 25 MG PO TABS
25.0000 mg | ORAL_TABLET | Freq: Every day | ORAL | Status: DC
  Administered 2020-11-09 – 2020-11-10 (×2): 25 mg via ORAL
  Filled 2020-11-09 (×2): qty 1

## 2020-11-09 MED ORDER — LACTATED RINGERS IV SOLN: INTRAVENOUS | Status: AC

## 2020-11-09 MED ORDER — ENSURE ENLIVE PO LIQD
237.0000 mL | Freq: Two times a day (BID) | ORAL | Status: DC
  Administered 2020-11-09 – 2020-11-13 (×8): 237 mL via ORAL

## 2020-11-09 MED ORDER — POTASSIUM CHLORIDE 20 MEQ PO PACK
40.0000 meq | PACK | Freq: Once | ORAL | Status: AC
  Administered 2020-11-09: 40 meq via ORAL
  Filled 2020-11-09: qty 2

## 2020-11-09 NOTE — Progress Notes (Signed)
Patient is a yellow MEWS because his HR is 120, patient has been tachy since in the ED. Yellow MEWS protocol initiated and NP Blount was notified.

## 2020-11-09 NOTE — Progress Notes (Signed)
MD paged regarding vitals. Yellow MEWS. Awaiting new orders.

## 2020-11-09 NOTE — Progress Notes (Signed)
Pharmacy Antibiotic Note  Walter [name] is a 70 y.o. male admitted on 11/08/2020 with sepsis.  Pharmacy has been consulted for cefepime dosing.  Patientis a 70 y.o.malewith medical history significant forrectal adenocarcinoma undergoing chemotherapy with FOLFOX s/p cycle 4 on 10/29/2020, hypertension, CKD stage III/IV who presented to the ED on 2/20 for evaluation of dysuria and weakness.  Plan: Cefepime 2 g IV every 12 hours Monitor clinical picture and renal function F/U C&S, abx deescalation / LOT   Height: 5\' 9"  (175.3 cm) Weight: 86.2 kg (190 lb) IBW/kg (Calculated) : 70.7  Temp (24hrs), Avg:98.7 F (37.1 C), Min:98 F (36.7 C), Max:101.2 F (38.4 C)  Recent Labs  Lab 11/08/20 1945 11/09/20 0438  WBC 28.5* 29.3*  CREATININE 2.29* 2.15*  LATICACIDVEN 1.1  --     Estimated Creatinine Clearance: 34.8 mL/min (A) (by C-G formula based on SCr of 2.15 mg/dL (H)).    No Known Allergies  Antimicrobials this admission: 2/20 Vancomycin 1000 mg IV x 1 2/20 metronidazole 500 mg IV x 1 2/21 ceftriaxone 1 g IV x 1 Cefepime 2/20 >>  Dose adjustments this admission:   Microbiology results: 2/20 BCx:  2/20 UCx: sent  Thank you for allowing pharmacy to be a part of this patient's care.  Efraim Kaufmann, PharmD, BCPS 11/09/2020 6:54 PM

## 2020-11-09 NOTE — Telephone Encounter (Signed)
Wanted MD aware he was admitted to hospital last night w/UTI, low BP and tachycardia. Is now on IV antibiotics. Will not make his appointments tomorrow. Informed him that Dr. Benay Spice became aware of his admission this afternoon and will round on him tomorrow.

## 2020-11-09 NOTE — CV Procedure (Signed)
BLE venous duplex completed.  Results can be found under chart review under CV PROC. 11/09/2020 11:49 AM Jody RVT, RDMS

## 2020-11-09 NOTE — ED Notes (Signed)
Pt ambulatory to restroom w/out assistance. Pt requested another brief, which was supplied to him.

## 2020-11-09 NOTE — Progress Notes (Signed)
PROGRESS NOTE    Walter [name]  TDD:[id number] DOB: 1951/05/06 DOA: 11/08/2020 PCP: Default, Provider, MD    Chief Complaint  Patient presents with  . Urinary Frequency  . Fatigue    Brief Narrative:  Walter [name] is a 70 y.o. male with medical history significant for rectal adenocarcinoma undergoing chemotherapy with FOLFOX s/p cycle 4 on 10/29/2020, hypertension, CKD stage III/IV who presents to the ED for evaluation of dysuria and weakness.  Patient states he completed his last chemotherapy 10/29/2020 and responded similar to his prior sessions.  Over the last few days he has just become generally weak with some episodes of nausea.  He has had increased urinary frequency with dysuria.  He has had intermittent palpitations but denies any chest pain.  He has occasional shortness of breath without cough.  He has seen some swelling in his legs which he feels is improving now compared to prior.  ED Course:  Initial vitals showed BP 155/86, pulse 133, RR 27, temp 98.7 F, SPO2 97% on room air.  Labs show WBC 28.5, hemoglobin 11.2, platelets 152,000, sodium 133, potassium 3.1, bicarb 24, BUN 24, creatinine 2.29, serum glucose 118, AST 50, ALT 80, alk phos 141, total bilirubin 0.6, lactic acid 1.1.  Urinalysis shows negative nitrites, moderate leukocytes, 0-5 RBC/hpf, >50 WBC/hpf, rare bacteria microscopy.  Urine and blood cultures obtained and pending.  SARS-CoV-2 PCR panel in process.  Portable chest x-ray is negative for focal consolidation, edema, or effusion.  Right-sided Port-A-Cath in place.  Patient was given 2 L LR, 1 L NS, IV vancomycin metronidazole, and cefepime.  The hospitalist service was consulted to admit for further evaluation and management.   Assessment & Plan:   Principal Problem:   Sepsis due to urinary tract infection (HCC) Active Problems:   Rectal adenocarcinoma (HCC)   Hypertension   CKD (chronic kidney disease)   1 sepsis secondary to UTI,  POA Patient presenting with sepsis with tachycardia, tachypnea, fever.  Urinalysis worrisome for UTI.  Patient with recent chemotherapy and recent G-CSF treatment.  Patient pancultured results pending.  Patient with ongoing fevers with temp as high as 101 this afternoon.  Will broaden antibiotic coverage.  Discontinue IV Rocephin and placed on IV cefepime.  Gentle hydration monitor for fluid overload as patient with chronic kidney disease.  Follow.  2.  Sinus tachycardia Likely secondary to problem #1.  Patient with intermittent ongoing palpitations and tachycardia.  Due to chronic kidney disease unable to get CT angiogram chest to rule out PE.  Lower extremity Dopplers which were done were negative.  We will give a normal saline 250 cc bolus.  Placed on gentle hydration.  Broaden IV antibiotics.  Cultures pending.  Follow.  3.  Chronic kidney disease stage III/IV Currently at baseline patient with borderline chronic kidney disease stage III/IV.  Gentle hydration and monitor for volume overload.  Follow.  4.  Hypertension Continue Norvasc, chlorthalidone.  Follow.  5.  Hypomagnesemia/hypokalemia Potassium at 3.6.  Magnesium sulfate 4 g IV x1.  Follow.  6.  Leukocytosis Likely secondary to problem #1 in the setting of recent G-CSF.  Patient pancultured.  Will broaden IV antibiotics from IV Rocephin to IV cefepime.  Follow.  7.  Rectal adenocarcinoma undergoing chemotherapy Patient on active chemotherapy status post cycle 4 FOLFOX 10/29/2020.  Given G-CSF with last treatment.  Oncology, Dr. Truett Perna notified via epic of patient's admission.  Ongoing chemo likely will be delayed secondary to acute infection.   DVT prophylaxis: Heparin Code  Status: Partial Family Communication: Updated patient.  No family at bedside. Disposition:   Status is: Inpatient    Dispo: The patient is from: Home              Anticipated d/c is to: Home              Anticipated d/c date is: 3 to 4 days               Patient currently concerns for sepsis, ongoing fever, tachycardia.  Not stable for discharge.   Difficult to place patient no       Consultants:   Oncology informed of admission via epic: Dr. Benay Spice  Procedures:   Chest x-ray 11/08/2020  Lower extremity Dopplers 11/09/2020  Antimicrobials:   IV Rocephin 11/09/2020>>> 11/09/2020  IV cefepime x1 dose  IV Flagyl x1 dose  IV vancomycin x1 dose  IV cefepime 11/09/2020   Subjective: Patient sitting up in chair.  States he still feels his heart is racing.  Denies any chest pain.  Denies any shortness of breath.  Overall feeling better than on admission.  States dysuria has improved.  Objective: Vitals:   11/09/20 0722 11/09/20 0755 11/09/20 0936 11/09/20 1335  BP: (!) 148/81 (!) 143/85 (!) 162/81 (!) 158/81  Pulse: (!) 115 (!) 116 (!) 113 (!) 125  Resp: $Remo'17 18 18 17  'uNBVi$ Temp: 98 F (36.7 C) 98.6 F (37 C) 98 F (36.7 C) (!) 101.2 F (38.4 C)  TempSrc: Oral Oral Oral Oral  SpO2: 99% 98% 99% 97%  Weight:      Height:        Intake/Output Summary (Last 24 hours) at 11/09/2020 1419 Last data filed at 11/09/2020 1400 Gross per 24 hour  Intake 1230.81 ml  Output 1000 ml  Net 230.81 ml   Filed Weights   11/09/20 0522  Weight: 86.2 kg    Examination:  General exam: Appears calm and comfortable  Respiratory system: Clear to auscultation. Respiratory effort normal. Cardiovascular system: Tachycardia.  No murmurs rubs or gallops.  No JVD.  No lower extremity edema.  Gastrointestinal system: Abdomen is nondistended, soft and nontender. No organomegaly or masses felt. Normal bowel sounds heard. Central nervous system: Alert and oriented. No focal neurological deficits. Extremities: Symmetric 5 x 5 power. Skin: No rashes, lesions or ulcers Psychiatry: Judgement and insight appear normal. Mood & affect appropriate.     Data Reviewed: I have personally reviewed following labs and imaging studies  CBC: Recent Labs  Lab  11/08/20 1945 11/09/20 0438  WBC 28.5* 29.3*  NEUTROABS 19.2*  --   HGB 11.2* 11.0*  HCT 33.9* 33.4*  MCV 84.1 84.1  PLT 152 145*    Basic Metabolic Panel: Recent Labs  Lab 11/08/20 1945 11/09/20 0438  NA 133* 136  K 3.1* 3.6  CL 97* 103  CO2 24 22  GLUCOSE 118* 163*  BUN 24* 21  CREATININE 2.29* 2.15*  CALCIUM 8.5* 8.5*  MG  --  1.4*    GFR: Estimated Creatinine Clearance: 34.8 mL/min (A) (by C-G formula based on SCr of 2.15 mg/dL (H)).  Liver Function Tests: Recent Labs  Lab 11/08/20 1945 11/09/20 0438  AST 50* 49*  ALT 80* 70*  ALKPHOS 141* 135*  BILITOT 0.6 0.7  PROT 6.6 6.4*  ALBUMIN 3.4* 3.2*    CBG: No results for input(s): GLUCAP in the last 168 hours.   Recent Results (from the past 240 hour(s))  Blood Culture (routine x 2)  Status: None (Preliminary result)   Collection Time: 11/08/20 10:57 PM   Specimen: BLOOD RIGHT HAND  Result Value Ref Range Status   Specimen Description   Final    BLOOD RIGHT HAND Performed at [facility], [place] [address] [place], [place] [address]    Special Requests   Final    BOTTLES DRAWN AEROBIC AND ANAEROBIC Blood Culture adequate volume Performed at [facility] [address], [place], [place] [address]    Culture PENDING  Incomplete   Report Status PENDING  Incomplete         Radiology Studies: DG Chest Port 1 View  Result Date: 11/08/2020 CLINICAL DATA:  Questionable sepsis. EXAM: PORTABLE CHEST 1 VIEW COMPARISON:  None. FINDINGS: A right Port-A-Cath terminates in the central SVC. No pneumothorax. There is elevation left hemidiaphragm. The lungs are clear. The cardiomediastinal silhouette is unremarkable. No other acute abnormalities. IMPRESSION: No active disease. Electronically Signed   By: Dorise Bullion III M.D   On: 11/08/2020 19:22   VAS [place] LOWER EXTREMITY VENOUS (DVT)  Result Date: 11/09/2020  Lower Venous DVT Study Indications: Edema.  Comparison Study: No  previous exams Performing Technologist: Rogelia Rohrer  Examination Guidelines: A complete evaluation includes B-mode imaging, spectral Doppler, color Doppler, and power Doppler as needed of all accessible portions of each vessel. Bilateral testing is considered an integral part of a complete examination. Limited examinations for reoccurring indications may be performed as noted. The reflux portion of the exam is performed with the patient in reverse Trendelenburg.  +---------+---------------+---------+-----------+----------+--------------+ RIGHT    CompressibilityPhasicitySpontaneityPropertiesThrombus Aging +---------+---------------+---------+-----------+----------+--------------+ CFV      Full           Yes      Yes                                 +---------+---------------+---------+-----------+----------+--------------+ SFJ      Full                                                        +---------+---------------+---------+-----------+----------+--------------+ FV Prox  Full           Yes      Yes                                 +---------+---------------+---------+-----------+----------+--------------+ FV Mid   Full           Yes      Yes                                 +---------+---------------+---------+-----------+----------+--------------+ FV DistalFull           Yes      Yes                                 +---------+---------------+---------+-----------+----------+--------------+ PFV      Full                                                        +---------+---------------+---------+-----------+----------+--------------+  POP      Full           Yes      Yes                                 +---------+---------------+---------+-----------+----------+--------------+ PTV      Full                                                        +---------+---------------+---------+-----------+----------+--------------+ PERO     Full                                                         +---------+---------------+---------+-----------+----------+--------------+   +---------+---------------+---------+-----------+----------+--------------+ LEFT     CompressibilityPhasicitySpontaneityPropertiesThrombus Aging +---------+---------------+---------+-----------+----------+--------------+ CFV      Full           Yes      Yes                                 +---------+---------------+---------+-----------+----------+--------------+ SFJ      Full                                                        +---------+---------------+---------+-----------+----------+--------------+ FV Prox  Full           Yes      Yes                                 +---------+---------------+---------+-----------+----------+--------------+ FV Mid   Full           Yes      Yes                                 +---------+---------------+---------+-----------+----------+--------------+ FV DistalFull           Yes      Yes                                 +---------+---------------+---------+-----------+----------+--------------+ PFV      Full                                                        +---------+---------------+---------+-----------+----------+--------------+ POP      Full           Yes      Yes                                 +---------+---------------+---------+-----------+----------+--------------+ PTV  Full                                                        +---------+---------------+---------+-----------+----------+--------------+ PERO     Full                                                        +---------+---------------+---------+-----------+----------+--------------+     Summary: BILATERAL: - No evidence of deep vein thrombosis seen in the lower extremities, bilaterally. - No evidence of superficial venous thrombosis in the lower extremities, bilaterally. -No evidence of popliteal cyst, bilaterally.    *See table(s) above for measurements and observations.    Preliminary         Scheduled Meds: . amLODipine  10 mg Oral Daily  . Chlorhexidine Gluconate Cloth  6 each Topical Daily  . chlorthalidone  25 mg Oral Daily  . feeding supplement  237 mL Oral BID BM  . heparin  5,000 Units Subcutaneous Q8H   Continuous Infusions: . sodium chloride    . cefTRIAXone (ROCEPHIN)  IV 1 g (11/09/20 1228)  . sodium chloride       LOS: 0 days    Time spent: 40 minutes    Irine Seal, MD [facility]   To contact the attending provider between 7A-7P or the covering provider during after hours 7P-7A, please log into the web site [email or web address] and access using universal [place] password for that web site. If you do not have the password, please call the hospital operator.  11/09/2020, 2:19 PM

## 2020-11-09 NOTE — Progress Notes (Signed)
Vitals late d/t ultrasound being in the room.

## 2020-11-10 ENCOUNTER — Inpatient Hospital Stay: Payer: Medicare Other | Admitting: Oncology

## 2020-11-10 ENCOUNTER — Inpatient Hospital Stay: Payer: Medicare Other

## 2020-11-10 DIAGNOSIS — N390 Urinary tract infection, site not specified: Secondary | ICD-10-CM

## 2020-11-10 DIAGNOSIS — A419 Sepsis, unspecified organism: Secondary | ICD-10-CM

## 2020-11-10 DIAGNOSIS — N1832 Chronic kidney disease, stage 3b: Secondary | ICD-10-CM | POA: Diagnosis not present

## 2020-11-10 DIAGNOSIS — I10 Essential (primary) hypertension: Secondary | ICD-10-CM | POA: Diagnosis not present

## 2020-11-10 DIAGNOSIS — C20 Malignant neoplasm of rectum: Secondary | ICD-10-CM | POA: Diagnosis not present

## 2020-11-10 LAB — COMPREHENSIVE METABOLIC PANEL
ALT: 66 U/L — ABNORMAL HIGH (ref 0–44)
AST: 57 U/L — ABNORMAL HIGH (ref 15–41)
Albumin: 2.8 g/dL — ABNORMAL LOW (ref 3.5–5.0)
Alkaline Phosphatase: 115 U/L (ref 38–126)
Anion gap: 11 (ref 5–15)
BUN: 21 mg/dL (ref 8–23)
CO2: 22 mmol/L (ref 22–32)
Calcium: 8.1 mg/dL — ABNORMAL LOW (ref 8.9–10.3)
Chloride: 104 mmol/L (ref 98–111)
Creatinine, Ser: 2.18 mg/dL — ABNORMAL HIGH (ref 0.61–1.24)
GFR, Estimated: 32 mL/min — ABNORMAL LOW (ref >60)
Glucose, Bld: 124 mg/dL — ABNORMAL HIGH (ref 70–99)
Potassium: 3.4 mmol/L — ABNORMAL LOW (ref 3.5–5.1)
Sodium: 137 mmol/L (ref 135–145)
Total Bilirubin: 0.6 mg/dL (ref 0.3–1.2)
Total Protein: 5.8 g/dL — ABNORMAL LOW (ref 6.5–8.1)

## 2020-11-10 LAB — CBC WITH DIFFERENTIAL/PLATELET
Abs Immature Granulocytes: 1.42 10*3/uL — ABNORMAL HIGH (ref 0.00–0.07)
Basophils Absolute: 0.1 10*3/uL (ref 0.0–0.1)
Basophils Relative: 0 %
Eosinophils Absolute: 0 10*3/uL (ref 0.0–0.5)
Eosinophils Relative: 0 %
HCT: 30.3 % — ABNORMAL LOW (ref 39.0–52.0)
Hemoglobin: 9.8 g/dL — ABNORMAL LOW (ref 13.0–17.0)
Immature Granulocytes: 9 %
Lymphocytes Relative: 8 %
Lymphs Abs: 1.3 10*3/uL (ref 0.7–4.0)
MCH: 27.2 pg (ref 26.0–34.0)
MCHC: 32.3 g/dL (ref 30.0–36.0)
MCV: 84.2 fL (ref 80.0–100.0)
Monocytes Absolute: 1.9 10*3/uL — ABNORMAL HIGH (ref 0.1–1.0)
Monocytes Relative: 12 %
Neutro Abs: 11 10*3/uL — ABNORMAL HIGH (ref 1.7–7.7)
Neutrophils Relative %: 71 %
Platelets: 125 10*3/uL — ABNORMAL LOW (ref 150–400)
RBC: 3.6 MIL/uL — ABNORMAL LOW (ref 4.22–5.81)
RDW: 19.2 % — ABNORMAL HIGH (ref 11.5–15.5)
WBC: 15.7 10*3/uL — ABNORMAL HIGH (ref 4.0–10.5)
nRBC: 1.3 % — ABNORMAL HIGH (ref 0.0–0.2)

## 2020-11-10 LAB — MAGNESIUM: Magnesium: 1.6 mg/dL — ABNORMAL LOW (ref 1.7–2.4)

## 2020-11-10 MED ORDER — SODIUM CHLORIDE 0.9% FLUSH
10.0000 mL | INTRAVENOUS | Status: DC | PRN
  Administered 2020-11-10: 10 mL

## 2020-11-10 MED ORDER — POTASSIUM CHLORIDE CRYS ER 20 MEQ PO TBCR
40.0000 meq | EXTENDED_RELEASE_TABLET | Freq: Once | ORAL | Status: AC
  Administered 2020-11-10: 13:00:00 40 meq via ORAL
  Filled 2020-11-10: qty 2

## 2020-11-10 MED ORDER — MAGNESIUM SULFATE 4 GM/100ML IV SOLN
4.0000 g | Freq: Once | INTRAVENOUS | Status: AC
  Administered 2020-11-10: 4 g via INTRAVENOUS
  Filled 2020-11-10: qty 100

## 2020-11-10 MED ORDER — SODIUM CHLORIDE 0.9% FLUSH
10.0000 mL | Freq: Two times a day (BID) | INTRAVENOUS | Status: DC
  Administered 2020-11-12: 10:00:00 10 mL

## 2020-11-10 NOTE — Progress Notes (Signed)
PROGRESS NOTE    Walter [name]  XQJ:[id number] DOB: [birth year] DOA: 11/08/2020 PCP: Default, Provider, MD    Chief Complaint  Patient presents with  . Urinary Frequency  . Fatigue    Brief Narrative:  Walter [name] is a 70 y.o. male with medical history significant for rectal adenocarcinoma undergoing chemotherapy with FOLFOX s/p cycle 4 on 10/29/2020, hypertension, CKD stage III/IV who presents to the ED for evaluation of dysuria and weakness.  Patient states he completed his last chemotherapy 10/29/2020 and responded similar to his prior sessions.  Over the last few days he has just become generally weak with some episodes of nausea.  He has had increased urinary frequency with dysuria.  He has had intermittent palpitations but denies any chest pain.  He has occasional shortness of breath without cough.  He has seen some swelling in his legs which he feels is improving now compared to prior.  ED Course:  Initial vitals showed BP 155/86, pulse 133, RR 27, temp 98.7 F, SPO2 97% on room air.  Labs show WBC 28.5, hemoglobin 11.2, platelets 152,000, sodium 133, potassium 3.1, bicarb 24, BUN 24, creatinine 2.29, serum glucose 118, AST 50, ALT 80, alk phos 141, total bilirubin 0.6, lactic acid 1.1.  Urinalysis shows negative nitrites, moderate leukocytes, 0-5 RBC/hpf, >50 WBC/hpf, rare bacteria microscopy.  Urine and blood cultures obtained and pending.  SARS-CoV-2 PCR panel in process.  Portable chest x-ray is negative for focal consolidation, edema, or effusion.  Right-sided Port-A-Cath in place.  Patient was given 2 L LR, 1 L NS, IV vancomycin metronidazole, and cefepime.  The hospitalist service was consulted to admit for further evaluation and management.   Assessment & Plan:   Principal Problem:   Sepsis due to urinary tract infection ([place]) Active Problems:   Rectal adenocarcinoma ([facility])   Hypertension   CKD (chronic kidney disease)   Leukocytosis   Tachycardia   1  sepsis secondary to UTI, POA Patient presenting with sepsis with tachycardia, tachypnea, fever.  Urinalysis worrisome for UTI.  Patient with recent chemotherapy and recent G-CSF treatment.  Patient pancultured results pending.  Patient noted to have a temperature of 101.2 (11/09/2020 ) and as such IV antibiotics were broadened from IV Rocephin to IV cefepime.  Fever curve trending down.  Leukocytosis trending down.  Decrease IV fluids to 50 cc an hour.  Continue IV cefepime.  Supportive care.  Follow.   2.  Sinus tachycardia Likely secondary to problem #1.  Patient with intermittent ongoing palpitations and tachycardia.  Due to chronic kidney disease unable to get CT angiogram chest to rule out PE.  Lower extremity Dopplers which were done were negative.  Tachycardia improving with hydration and IV antibiotics.  Decrease IV fluids to 50 cc an hour.  Continue IV cefepime.  Follow.   3.  Chronic kidney disease stage III/IV Currently at baseline patient with borderline chronic kidney disease stage III/IV.  Decrease IV fluids to 50 cc an hour.  Follow.   4.  Hypertension Stable on current regimen of Norvasc and chlorthalidone.  Decrease IV fluids to 50 cc an hour for the next 24 hours.  Follow.   5.  Hypomagnesemia/hypokalemia Potassium at 3.4.  Magnesium at 1.6.  Magnesium sulfate 2 g IV x1.  K-Dur 40 mEq p.o. x1.  Repeat labs in the morning.  6.  Leukocytosis Likely secondary to problem #1 in the setting of recent G-CSF.  Patient pancultured.  Due to ongoing fevers on 11/09/2020 IV antibiotic coverage was  broadened to IV cefepime.  Leukocytosis trending down.  Follow.   7.  Rectal adenocarcinoma undergoing chemotherapy Patient on active chemotherapy status post cycle 4 FOLFOX 10/29/2020.  Given G-CSF with last treatment.  Oncology, Dr. Benay Spice following.  Chemotherapy which was scheduled this week has been canceled.  Outpatient follow-up.   DVT prophylaxis: Heparin Code Status: Partial Family  Communication: Updated patient.  No family at bedside. Disposition:   Status is: Inpatient    Dispo: The patient is from: Home              Anticipated d/c is to: Home              Anticipated d/c date is: 2-3 days              Patient currently concerns for sepsis, ongoing fever, tachycardia.  Not stable for discharge.   Difficult to place patient no       Consultants:   Oncology informed of admission via epic: Dr. Benay Spice  Procedures:   Chest x-ray 11/08/2020  Lower extremity Dopplers 11/09/2020  Antimicrobials:   IV Rocephin 11/09/2020>>> 11/09/2020  IV cefepime x1 dose  IV Flagyl x1 dose  IV vancomycin x1 dose  IV cefepime 11/09/2020 >>>>   Subjective: Patient sitting up in chair.  Overall feeling better.  Denies any shortness of breath.  No chest pain.  Dysuria improved significantly.  Currently afebrile.  Objective: Vitals:   11/09/20 2124 11/10/20 0132 11/10/20 0624 11/10/20 0936  BP: (!) 143/85 132/68 (!) 141/85 (!) 146/65  Pulse: (!) 118 (!) 114 (!) 117 (!) 110  Resp: _0 Temp: 99.5 F (37.5 C) 98.1 F (36.7 C) 98.9 F (37.2 C) 98.2 F (36.8 C)  TempSrc: Oral Oral Oral Oral  SpO2: 94% 95% 100% 96%  Weight:      Height:        Intake/Output Summary (Last 24 hours) at 11/10/2020 1220 Last data filed at 11/10/2020 1059 Gross per 24 hour  Intake 3302.48 ml  Output --  Net 3302.48 ml   Filed Weights   11/09/20 0522  Weight: 86.2 kg    Examination:  General exam: : NAD Respiratory system: CTA B anterior lung fields.  No wheezes, no rhonchi.  Speaking in full sentences.  Normal respiratory effort. Cardiovascular system: Regular rate and rhythm no murmurs rubs or gallops.  No JVD.  No lower extremity edema.  Gastrointestinal system: Abdomen soft, nontender, nondistended, positive bowel sounds.  No rebound.  No guarding. Central nervous system: Alert and oriented. No focal neurological deficits. Extremities: Symmetric 5 x 5  power. Skin: No rashes, lesions or ulcers Psychiatry: Judgement and insight appear normal. Mood & affect appropriate.   Data Reviewed: I have personally reviewed following labs and imaging studies  CBC: Recent Labs  Lab 11/08/20 1945 11/09/20 0438 11/10/20 1054  WBC 28.5* 29.3* 15.7*  NEUTROABS 19.2*  --  11.0*  HGB 11.2* 11.0* 9.8*  HCT 33.9* 33.4* 30.3*  MCV 84.1 84.1 84.2  PLT 152 145* 125*    Basic Metabolic Panel: Recent Labs  Lab 11/08/20 1945 11/09/20 0438 11/10/20 1054  NA 133* 136 137  K 3.1* 3.6 3.4*  CL 97* 103 104  CO2 _1 GLUCOSE 118* 163* 124*  BUN 24* 21 21  CREATININE 2.29* 2.15* 2.18*  CALCIUM 8.5* 8.5* 8.1*  MG  --  1.4* 1.6*    GFR: Estimated Creatinine Clearance: 34.3 mL/min (A) (by C-G formula based on SCr of  2.18 mg/dL (H)).  Liver Function Tests: Recent Labs  Lab 11/08/20 1945 11/09/20 0438 11/10/20 1054  AST 50* 49* 57*  ALT 80* 70* 66*  ALKPHOS 141* 135* 115  BILITOT 0.6 0.7 0.6  PROT 6.6 6.4* 5.8*  ALBUMIN 3.4* 3.2* 2.8*    CBG: No results for input(s): GLUCAP in the last 168 hours.   Recent Results (from the past 240 hour(s))  Urine culture     Status: Abnormal (Preliminary result)   Collection Time: 11/08/20  6:50 PM   Specimen: In/Out Cath Urine  Result Value Ref Range Status   Specimen Description   Final    IN/OUT CATH URINE Performed at [place] [address], [place], [place] [address]    Special Requests   Final    NONE Performed at [facility], [place] [address], [place], [place] [address]    Culture (A)  Final    >=100,000 COLONIES/mL ESCHERICHIA COLI SUSCEPTIBILITIES TO FOLLOW Performed at [facility], [place] [address] [place], [place] [address]    Report Status PENDING  Incomplete  Blood Culture (routine x 2)     Status: None (Preliminary result)   Collection Time: 11/08/20 10:57 PM   Specimen: BLOOD RIGHT HAND  Result Value Ref Range Status    Specimen Description   Final    BLOOD RIGHT HAND Performed at [facility], [place] [address] [place], [place] [address]    Special Requests   Final    BOTTLES DRAWN AEROBIC AND ANAEROBIC Blood Culture adequate volume Performed at [place] [address] [place], [place] [address]    Culture   Final    NO GROWTH 1 DAY Performed at [facility], [place] [address] [place], [place] [address]    Report Status PENDING  Incomplete  Blood Culture (routine x 2)     Status: None (Preliminary result)   Collection Time: 11/08/20 11:00 PM   Specimen: BLOOD  Result Value Ref Range Status   Specimen Description   Final    BLOOD RIGHT ANTECUBITAL Performed at [place] [address] [place], [place] [address]    Special Requests   Final    BOTTLES DRAWN AEROBIC AND ANAEROBIC Blood Culture adequate volume Performed at [place] [address] [place], [place] [address]    Culture   Final    NO GROWTH 1 DAY Performed at [facility], [place] [address] [place],  [address]    Report Status PENDING  Incomplete         Radiology Studies: DG Chest Port 1 View  Result Date: 11/08/2020 CLINICAL DATA:  Questionable sepsis. EXAM: PORTABLE CHEST 1 VIEW COMPARISON:  None. FINDINGS: A right Port-A-Cath terminates in the central SVC. No pneumothorax. There is elevation left hemidiaphragm. The lungs are clear. The cardiomediastinal silhouette is unremarkable. No other acute abnormalities. IMPRESSION: No active disease. Electronically Signed   By: Dorise Bullion III M.D   On: 11/08/2020 19:22   VAS [place] LOWER EXTREMITY VENOUS (DVT)  Result Date: 11/09/2020  Lower Venous DVT Study Indications: Edema.  Comparison Study: No previous exams Performing Technologist: Rogelia Rohrer  Examination Guidelines: A complete evaluation includes B-mode imaging, spectral Doppler, color Doppler, and power Doppler as needed of all  accessible portions of each vessel. Bilateral testing is considered an integral part of a complete examination. Limited examinations for reoccurring indications may be performed as noted. The  reflux portion of the exam is performed with the patient in reverse Trendelenburg.  +---------+---------------+---------+-----------+----------+--------------+ RIGHT    CompressibilityPhasicitySpontaneityPropertiesThrombus Aging +---------+---------------+---------+-----------+----------+--------------+ CFV      Full           Yes      Yes                                 +---------+---------------+---------+-----------+----------+--------------+ SFJ      Full                                                        +---------+---------------+---------+-----------+----------+--------------+ FV Prox  Full           Yes      Yes                                 +---------+---------------+---------+-----------+----------+--------------+ FV Mid   Full           Yes      Yes                                 +---------+---------------+---------+-----------+----------+--------------+ FV DistalFull           Yes      Yes                                 +---------+---------------+---------+-----------+----------+--------------+ PFV      Full                                                        +---------+---------------+---------+-----------+----------+--------------+ POP      Full           Yes      Yes                                 +---------+---------------+---------+-----------+----------+--------------+ PTV      Full                                                        +---------+---------------+---------+-----------+----------+--------------+ PERO     Full                                                        +---------+---------------+---------+-----------+----------+--------------+   +---------+---------------+---------+-----------+----------+--------------+  LEFT     CompressibilityPhasicitySpontaneityPropertiesThrombus Aging +---------+---------------+---------+-----------+----------+--------------+ CFV      Full           Yes      Yes                                 +---------+---------------+---------+-----------+----------+--------------+  SFJ      Full                                                        +---------+---------------+---------+-----------+----------+--------------+ FV Prox  Full           Yes      Yes                                 +---------+---------------+---------+-----------+----------+--------------+ FV Mid   Full           Yes      Yes                                 +---------+---------------+---------+-----------+----------+--------------+ FV DistalFull           Yes      Yes                                 +---------+---------------+---------+-----------+----------+--------------+ PFV      Full                                                        +---------+---------------+---------+-----------+----------+--------------+ POP      Full           Yes      Yes                                 +---------+---------------+---------+-----------+----------+--------------+ PTV      Full                                                        +---------+---------------+---------+-----------+----------+--------------+ PERO     Full                                                        +---------+---------------+---------+-----------+----------+--------------+     Summary: BILATERAL: - No evidence of deep vein thrombosis seen in the lower extremities, bilaterally. - No evidence of superficial venous thrombosis in the lower extremities, bilaterally. -No evidence of popliteal cyst, bilaterally.   *See table(s) above for measurements and observations. Electronically signed by Ruta Hinds MD on 11/09/2020 at 5:52:26 PM.    Final         Scheduled Meds: . amLODipine  10 mg  Oral Daily  . Chlorhexidine Gluconate Cloth  6 each Topical Daily  . chlorthalidone  25 mg Oral Daily  . feeding supplement  237 mL Oral BID BM  . heparin  5,000 Units Subcutaneous Q8H  . potassium chloride  40 mEq Oral Once  . sodium chloride flush  10-40  mL Intracatheter Q12H   Continuous Infusions: . sodium chloride 75 mL/hr at 11/10/20 0559  . ceFEPime (MAXIPIME) IV 2 g (11/10/20 0908)  . magnesium sulfate bolus IVPB       LOS: 1 day    Time spent: 35 minutes    Irine Seal, MD [facility]   To contact the attending provider between 7A-7P or the covering provider during after hours 7P-7A, please log into the web site [email or web address] and access using universal [place] password for that web site. If you do not have the password, please call the hospital operator.  11/10/2020, 12:20 PM

## 2020-11-10 NOTE — Progress Notes (Signed)
   11/09/20 2124  Assess: MEWS Score  Temp 99.5 F (37.5 C)  BP (!) 143/85  Pulse Rate (!) 118  Resp 18  SpO2 94 %  O2 Device Room Air  Assess: MEWS Score  MEWS Temp 0  MEWS Systolic 0  MEWS Pulse 2  MEWS RR 0  MEWS LOC 0  MEWS Score 2  MEWS Score Color Yellow  Assess: if the MEWS score is Yellow or Red  Were vital signs taken at a resting state? Yes  Focused Assessment No change from prior assessment  Early Detection of Sepsis Score *See Row Information* High  MEWS guidelines implemented *See Row Information* No, previously yellow, continue vital signs every 4 hours  Treat  MEWS Interventions Administered prn meds/treatments  Take Vital Signs  Increase Vital Sign Frequency  Yellow: Q 2hr X 2 then Q 4hr X 2, if remains yellow, continue Q 4hrs (q4h)  Escalate  MEWS: Escalate Yellow: discuss with charge nurse/RN and consider discussing with provider and RRT  Notify: Charge Nurse/RN  Name of Charge Nurse/RN Notified Haley RN  Date Charge Nurse/RN Notified 11/09/20  Time Charge Nurse/RN Notified 2130  Notify: Provider  Provider Name/Title Lovey Newcomer (notified by other nurse)  Date Provider Notified 11/09/20  Document  Patient Outcome Other (Comment)  Progress note created (see row info) Yes

## 2020-11-10 NOTE — Progress Notes (Addendum)
HEMATOLOGY-ONCOLOGY PROGRESS NOTE  SUBJECTIVE: Walter [name] is currently under our care for rectal cancer.  He is receiving FOLFOX.  Last cycle was given on 10/27/2020.  He also received Udenyca with his chemotherapy.  He presented to the emergency room with dysuria and weakness.  He was also having some episodes of nausea as well as increased urinary frequency with dysuria.  His WBC was noted to be elevated on admission.  Urinalysis showed negative nitrites, moderate leukocytes, and greater than 50 WBC/hpf.  Urine culture showed greater than 100,000 gram-negative rods.  Susceptibilities pending.  Blood cultures are negative to date. He is receiving IV antibiotics.  Patient reports that he is feeling better today.  He still has some ongoing dysuria but that has improved.  He has been having some intermittent fevers and chills.  He reports some mild nausea but no vomiting.  Oncology History  Rectal adenocarcinoma (HCC)  08/26/2020 Initial Diagnosis   Rectal adenocarcinoma (HCC)   09/15/2020 -  Chemotherapy    Patient is on Treatment Plan: COLORECTAL FOLFOX Q14D X 4 MONTHS       PHYSICAL EXAMINATION:  Vitals:   11/10/20 0624 11/10/20 0936  BP: (!) 141/85 (!) 146/65  Pulse: (!) 117 (!) 110  Resp: 18 15  Temp: 98.9 F (37.2 C) 98.2 F (36.8 C)  SpO2: 100% 96%   Filed Weights   11/09/20 0522  Weight: 86.2 kg    Intake/Output from previous day: 02/21 0701 - 02/22 0700 In: 2450 [P.O.:1130; I.V.:1070; IV Piggyback:250] Out: -   GENERAL:alert, no distress and comfortable SKIN: skin color, texture, turgor are normal, no rashes or significant lesions OROPHARYNX:no exudate, no erythema and lips, buccal mucosa, and tongue normal  LUNGS: clear to auscultation and percussion with normal breathing effort HEART: regular rate & rhythm and no murmurs and no lower extremity edema ABDOMEN:abdomen soft, non-tender and normal bowel sounds NEURO: alert & oriented x 3 with fluent speech, no focal  motor/sensory deficits  Port-A-Cath without erythema  LABORATORY DATA:  I have reviewed the data as listed CMP Latest Ref Rng & Units 11/09/2020 11/08/2020 10/27/2020  Glucose 70 - 99 mg/dL 213(Y) 865(H) 846(N)  BUN 8 - 23 mg/dL 21 62(X) 52(W)  Creatinine 0.61 - 1.24 mg/dL 4.13(K) 4.40(N) 0.27(O)  Sodium 135 - 145 mmol/L 136 133(L) 137  Potassium 3.5 - 5.1 mmol/L 3.6 3.1(L) 3.2(L)  Chloride 98 - 111 mmol/L 103 97(L) 101  CO2 22 - 32 mmol/L 22 24 29   Calcium 8.9 - 10.3 mg/dL 5.3(G) 6.4(Q) 8.9  Total Protein 6.5 - 8.1 g/dL 6.4(L) 6.6 6.9  Total Bilirubin 0.3 - 1.2 mg/dL 0.7 0.6 0.3  Alkaline Phos 38 - 126 U/L 135(H) 141(H) 75  AST 15 - 41 U/L 49(H) 50(H) 17  ALT 0 - 44 U/L 70(H) 80(H) 18    Lab Results  Component Value Date   WBC 29.3 (H) 11/09/2020   HGB 11.0 (L) 11/09/2020   HCT 33.4 (L) 11/09/2020   MCV 84.1 11/09/2020   PLT 145 (L) 11/09/2020   NEUTROABS 19.2 (H) 11/08/2020    DG Chest Port 1 View  Result Date: 11/08/2020 CLINICAL DATA:  Questionable sepsis. EXAM: PORTABLE CHEST 1 VIEW COMPARISON:  None. FINDINGS: A right Port-A-Cath terminates in the central SVC. No pneumothorax. There is elevation left hemidiaphragm. The lungs are clear. The cardiomediastinal silhouette is unremarkable. No other acute abnormalities. IMPRESSION: No active disease. Electronically Signed   By: Gerome Sam III M.D   On: 11/08/2020 19:22  VAS [place] LOWER EXTREMITY VENOUS (DVT)  Result Date: 11/09/2020  Lower Venous DVT Study Indications: Edema.  Comparison Study: No previous exams Performing Technologist: Ernestene Mention  Examination Guidelines: A complete evaluation includes B-mode imaging, spectral Doppler, color Doppler, and power Doppler as needed of all accessible portions of each vessel. Bilateral testing is considered an integral part of a complete examination. Limited examinations for reoccurring indications may be performed as noted. The reflux portion of the exam is performed with the patient  in reverse Trendelenburg.  +---------+---------------+---------+-----------+----------+--------------+ RIGHT    CompressibilityPhasicitySpontaneityPropertiesThrombus Aging +---------+---------------+---------+-----------+----------+--------------+ CFV      Full           Yes      Yes                                 +---------+---------------+---------+-----------+----------+--------------+ SFJ      Full                                                        +---------+---------------+---------+-----------+----------+--------------+ FV Prox  Full           Yes      Yes                                 +---------+---------------+---------+-----------+----------+--------------+ FV Mid   Full           Yes      Yes                                 +---------+---------------+---------+-----------+----------+--------------+ FV DistalFull           Yes      Yes                                 +---------+---------------+---------+-----------+----------+--------------+ PFV      Full                                                        +---------+---------------+---------+-----------+----------+--------------+ POP      Full           Yes      Yes                                 +---------+---------------+---------+-----------+----------+--------------+ PTV      Full                                                        +---------+---------------+---------+-----------+----------+--------------+ PERO     Full                                                        +---------+---------------+---------+-----------+----------+--------------+   +---------+---------------+---------+-----------+----------+--------------+  LEFT     CompressibilityPhasicitySpontaneityPropertiesThrombus Aging +---------+---------------+---------+-----------+----------+--------------+ CFV      Full           Yes      Yes                                  +---------+---------------+---------+-----------+----------+--------------+ SFJ      Full                                                        +---------+---------------+---------+-----------+----------+--------------+ FV Prox  Full           Yes      Yes                                 +---------+---------------+---------+-----------+----------+--------------+ FV Mid   Full           Yes      Yes                                 +---------+---------------+---------+-----------+----------+--------------+ FV DistalFull           Yes      Yes                                 +---------+---------------+---------+-----------+----------+--------------+ PFV      Full                                                        +---------+---------------+---------+-----------+----------+--------------+ POP      Full           Yes      Yes                                 +---------+---------------+---------+-----------+----------+--------------+ PTV      Full                                                        +---------+---------------+---------+-----------+----------+--------------+ PERO     Full                                                        +---------+---------------+---------+-----------+----------+--------------+     Summary: BILATERAL: - No evidence of deep vein thrombosis seen in the lower extremities, bilaterally. - No evidence of superficial venous thrombosis in the lower extremities, bilaterally. -No evidence of popliteal cyst, bilaterally.   *See table(s) above for measurements and observations. Electronically signed by Fabienne Bruns MD on 11/09/2020 at 5:52:26 PM.    Final  ASSESSMENT AND PLAN: 1. Rectal cancer ? Rectal nodularity noted on colonoscopy 06/29/2020, removal of a rectal "polyp"revealed a tubulovillous adenoma with high-grade dysplasia ? Physical exam 08/04/2020-perianal mass invading the anal canal and sphincter complex-biopsy  positive for mucinous adenocarcinoma ? CTs 08/17/2020-no rectal mass identified, multiple liver cyst and too small to characterize low-attenuation liver lesions, midline ventral hernia containing proximal right colon, groundglass left upper lobe nodule and nonspecific 3 mm right middle lobe nodule ? Cycle1 FOLFOX 09/15/2020 ? MRI pelvis at [facility] 09/17/2020-anal and low rectal mass. Internal and external sphincters involved by the mass. Subtle extension of the tumor through the serosa of the low rectum. No local regional adenopathy. ? MRI liver at [facility] 09/17/2020-scattered hepatic cysts. No evidence of metastatic disease. ? Cycle 2 FOLFOX 09/29/2020 ? Cycle 3 FOLFOX 10/13/2020 ? Cycle 4 FOLFOX 10/27/2020-Udenyca 2. Renal insufficiency 3. 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 Hypertension 5. History of kidney stones 6. Sleep apnea 7. Umbilical hernia repair 8. 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  Hospital admission 11/08/2020-sepsis secondary to UTI-E. coli  Walter [name] is currently admitted for sepsis secondary to UTI.  Urinalysis showed gram-negative rods and sensitivities are currently pending.  Symptomatically, he is feeling better but is still having fevers.  Recommend for him to continue IV fluid and IV antibiotics per hospitalist.  The patient has leukocytosis, mild anemia, and mild thrombocytopenia.  The patient received G-CSF following his chemotherapy which is likely the cause for the leukocytosis.  The anemia and thrombocytopenia are due to recent chemotherapy and infection.  Recommend close monitoring.  Recommendations: 1.  Continue IV antibiotics and IV fluids per hospitalist. 2.  Follow-up on urine culture and sensitivity. 3.  Monitor CBC closely.  No need for transfusion  at this time. 4.  His chemotherapy that was scheduled for this week has been canceled.   LOS: 1 day   Clenton Pare, DNP, AGPCNP-BC, AOCNP 11/10/20 Walter [name] was interviewed and examined.  He is now at day 15 following the most recent cycle of FOLFOX.  He received G-CSF on day 3.  He is admitted with a urinary tract infection.  The leukocytosis is secondary to G-CSF and infection.  He reports feeling better.  We will schedule an office visit and cycle 5 FOLFOX for next week.  Please call oncology as needed.  I was present for greater than 50% of today's visit.  I performed medical decision making.

## 2020-11-10 NOTE — Progress Notes (Signed)
Initial Nutrition Assessment  INTERVENTION:   -Ensure Enlive po BID, each supplement provides 350 kcal and 20 grams of protein  NUTRITION DIAGNOSIS:   Increased nutrient needs related to cancer and cancer related treatments as evidenced by estimated needs.  GOAL:   Patient will meet greater than or equal to 90% of their needs  MONITOR:   PO intake,Supplement acceptance,Labs,Weight trends,I & O's  REASON FOR ASSESSMENT:   Malnutrition Screening Tool    ASSESSMENT:   70 y.o. male with medical history significant for rectal adenocarcinoma undergoing chemotherapy with FOLFOX s/p cycle 4 on 10/29/2020, hypertension, CKD stage III/IV who presents to the ED for evaluation of dysuria and weakness.  Patient consuming 25-75% of meals since admission. Pt states he is feeling better today so he is eating better than he has been since his chemotherapy treatment on 2/10. States he always has nausea but it worsened over the last week. Has not been vomiting.  Pt with an Ensure supplement at bedside. Pt states he plans to drink them. He says he drinks a protein drink at home but doesn't state what brand. Denies issues with chewing or swallowing.   Per weight records, pt has lost 13 lbs since 12/2 (6% wt loss x 2.5 months, significant for time frame).   Medications: KLOR-CON,  IV Mg sulfate, IV Zofran  Labs reviewed:  Low K, Mg  NUTRITION - FOCUSED PHYSICAL EXAM:  No depletions noted.  Diet Order:   Diet Order            Diet Heart Room service appropriate? Yes; Fluid consistency: Thin  Diet effective now                 EDUCATION NEEDS:   Education needs have been addressed  Skin:  Skin Assessment: Reviewed RN Assessment  Last BM:  2/22 -type 7  Height:   Ht Readings from Last 1 Encounters:  11/09/20 5\' 9"  (1.753 m)    Weight:   Wt Readings from Last 1 Encounters:  11/09/20 86.2 kg   BMI:  Body mass index is 28.06 kg/m.  Estimated Nutritional Needs:   Kcal:   2500-2700  Protein:  120-130g  Fluid:  2.5L/day  Clayton Bibles, MS, RD, LDN Inpatient Clinical Dietitian Contact information available via Amion

## 2020-11-11 DIAGNOSIS — C20 Malignant neoplasm of rectum: Secondary | ICD-10-CM | POA: Diagnosis not present

## 2020-11-11 DIAGNOSIS — A419 Sepsis, unspecified organism: Secondary | ICD-10-CM | POA: Diagnosis not present

## 2020-11-11 DIAGNOSIS — N1832 Chronic kidney disease, stage 3b: Secondary | ICD-10-CM | POA: Diagnosis not present

## 2020-11-11 DIAGNOSIS — N390 Urinary tract infection, site not specified: Secondary | ICD-10-CM | POA: Diagnosis not present

## 2020-11-11 LAB — URINE CULTURE: Culture: >=100000 — AB

## 2020-11-11 LAB — BLOOD CULTURE ID PANEL (REFLEXED) - BCID2

## 2020-11-11 LAB — CBC WITH DIFFERENTIAL/PLATELET
Abs Immature Granulocytes: 1 10*3/uL — ABNORMAL HIGH (ref 0.00–0.07)
Basophils Absolute: 0.1 10*3/uL (ref 0.0–0.1)
Basophils Relative: 0 %
Eosinophils Absolute: 0.2 10*3/uL (ref 0.0–0.5)
Eosinophils Relative: 1 %
HCT: 30.4 % — ABNORMAL LOW (ref 39.0–52.0)
Hemoglobin: 9.8 g/dL — ABNORMAL LOW (ref 13.0–17.0)
Immature Granulocytes: 6 %
Lymphocytes Relative: 10 %
Lymphs Abs: 1.5 10*3/uL (ref 0.7–4.0)
MCH: 27.1 pg (ref 26.0–34.0)
MCHC: 32.2 g/dL (ref 30.0–36.0)
MCV: 84.2 fL (ref 80.0–100.0)
Monocytes Absolute: 2.5 10*3/uL — ABNORMAL HIGH (ref 0.1–1.0)
Monocytes Relative: 16 %
Neutro Abs: 10.4 10*3/uL — ABNORMAL HIGH (ref 1.7–7.7)
Neutrophils Relative %: 67 %
Platelets: 130 10*3/uL — ABNORMAL LOW (ref 150–400)
RBC: 3.61 MIL/uL — ABNORMAL LOW (ref 4.22–5.81)
RDW: 19.4 % — ABNORMAL HIGH (ref 11.5–15.5)
WBC: 15.7 10*3/uL — ABNORMAL HIGH (ref 4.0–10.5)
nRBC: 1 % — ABNORMAL HIGH (ref 0.0–0.2)

## 2020-11-11 LAB — COMPREHENSIVE METABOLIC PANEL
ALT: 68 U/L — ABNORMAL HIGH (ref 0–44)
AST: 59 U/L — ABNORMAL HIGH (ref 15–41)
Albumin: 2.9 g/dL — ABNORMAL LOW (ref 3.5–5.0)
Alkaline Phosphatase: 109 U/L (ref 38–126)
Anion gap: 11 (ref 5–15)
BUN: 23 mg/dL (ref 8–23)
CO2: 22 mmol/L (ref 22–32)
Calcium: 8.2 mg/dL — ABNORMAL LOW (ref 8.9–10.3)
Chloride: 105 mmol/L (ref 98–111)
Creatinine, Ser: 2.51 mg/dL — ABNORMAL HIGH (ref 0.61–1.24)
GFR, Estimated: 27 mL/min — ABNORMAL LOW (ref >60)
Glucose, Bld: 103 mg/dL — ABNORMAL HIGH (ref 70–99)
Potassium: 3.4 mmol/L — ABNORMAL LOW (ref 3.5–5.1)
Sodium: 138 mmol/L (ref 135–145)
Total Bilirubin: 0.5 mg/dL (ref 0.3–1.2)
Total Protein: 6 g/dL — ABNORMAL LOW (ref 6.5–8.1)

## 2020-11-11 LAB — MAGNESIUM: Magnesium: 2.5 mg/dL — ABNORMAL HIGH (ref 1.7–2.4)

## 2020-11-11 MED ORDER — PROCHLORPERAZINE MALEATE 10 MG PO TABS
10.0000 mg | ORAL_TABLET | Freq: Four times a day (QID) | ORAL | Status: DC | PRN
  Administered 2020-11-11 – 2020-11-12 (×3): 10 mg via ORAL
  Filled 2020-11-11 (×6): qty 1

## 2020-11-11 MED ORDER — SODIUM CHLORIDE 0.9 % IV SOLN
2.0000 g | Freq: Every day | INTRAVENOUS | Status: DC
  Administered 2020-11-11 – 2020-11-12 (×2): 2 g via INTRAVENOUS
  Filled 2020-11-11: qty 20
  Filled 2020-11-11 (×2): qty 2

## 2020-11-11 MED ORDER — CEPHALEXIN 500 MG PO CAPS: 500.0000 mg | ORAL_CAPSULE | Freq: Two times a day (BID) | ORAL | Status: DC

## 2020-11-11 NOTE — Progress Notes (Signed)
PHARMACY - PHYSICIAN COMMUNICATION CRITICAL VALUE ALERT - BLOOD CULTURE IDENTIFICATION (BCID)  Walter [name] is an 70 y.o. male who presented to [facility] on 11/08/2020 with a chief complaint of urinary frequency, fatigue.  Assessment:  urosepsis  Name of physician (or Provider) Contacted: Lorra Hals  Current antibiotics: Cefepime transitioned to Keflex today for E coli UTI  Changes to prescribed antibiotics recommended:  Could continue Keflex or start Rocephin 2 gm IV q24. Will start Rocephin 2 gm IV q24 now per BCID nomogram and have [facility] MD assess in AM  Results for orders placed or performed during the hospital encounter of 11/08/20  Blood Culture ID Panel (Reflexed) (Collected: 11/08/2020 10:57 PM)  Result Value Ref Range   Enterococcus faecalis NOT DETECTED NOT DETECTED   Enterococcus Faecium NOT DETECTED NOT DETECTED   Listeria monocytogenes NOT DETECTED NOT DETECTED   Staphylococcus species NOT DETECTED NOT DETECTED   Staphylococcus aureus (BCID) NOT DETECTED NOT DETECTED   Staphylococcus epidermidis NOT DETECTED NOT DETECTED   Staphylococcus lugdunensis NOT DETECTED NOT DETECTED   Streptococcus species NOT DETECTED NOT DETECTED   Streptococcus agalactiae NOT DETECTED NOT DETECTED   Streptococcus pneumoniae NOT DETECTED NOT DETECTED   Streptococcus pyogenes NOT DETECTED NOT DETECTED   A.calcoaceticus-baumannii NOT DETECTED NOT DETECTED   Bacteroides fragilis NOT DETECTED NOT DETECTED   Enterobacterales DETECTED (A) NOT DETECTED   Enterobacter cloacae complex NOT DETECTED NOT DETECTED   Escherichia coli DETECTED (A) NOT DETECTED   Klebsiella aerogenes NOT DETECTED NOT DETECTED   Klebsiella oxytoca NOT DETECTED NOT DETECTED   Klebsiella pneumoniae NOT DETECTED NOT DETECTED   Proteus species NOT DETECTED NOT DETECTED   Salmonella species NOT DETECTED NOT DETECTED   Serratia marcescens NOT DETECTED NOT DETECTED   Haemophilus influenzae NOT DETECTED NOT DETECTED   Neisseria  meningitidis NOT DETECTED NOT DETECTED   Pseudomonas aeruginosa NOT DETECTED NOT DETECTED   Stenotrophomonas maltophilia NOT DETECTED NOT DETECTED   Candida albicans NOT DETECTED NOT DETECTED   Candida auris NOT DETECTED NOT DETECTED   Candida glabrata NOT DETECTED NOT DETECTED   Candida krusei NOT DETECTED NOT DETECTED   Candida parapsilosis NOT DETECTED NOT DETECTED   Candida tropicalis NOT DETECTED NOT DETECTED   Cryptococcus neoformans/gattii NOT DETECTED NOT DETECTED   CTX-M ESBL NOT DETECTED NOT DETECTED   Carbapenem resistance IMP NOT DETECTED NOT DETECTED   Carbapenem resistance KPC NOT DETECTED NOT DETECTED   Carbapenem resistance NDM NOT DETECTED NOT DETECTED   Carbapenem resist OXA 48 LIKE NOT DETECTED NOT DETECTED   Carbapenem resistance VIM NOT DETECTED NOT DETECTED    Eudelia Bunch, Pharm.D 11/11/2020 9:19 PM

## 2020-11-11 NOTE — Progress Notes (Signed)
PROGRESS NOTE    Walter [name]  MEQ:[id number] DOB: 1951/03/25 DOA: 11/08/2020 PCP: Default, Provider, MD    Chief Complaint  Patient presents with  . Urinary Frequency  . Fatigue    Brief Narrative:  Walter [name] is a 70 y.o. male with medical history significant for rectal adenocarcinoma undergoing chemotherapy with FOLFOX s/p cycle 4 on 10/29/2020, hypertension, CKD stage III/IV who presents to the ED for evaluation of dysuria and weakness. Patient states he completed his last chemotherapy 10/29/2020 and responded similar to his prior sessions.  Over the last few days he has just become generally weak with some episodes of nausea.  He has had increased urinary frequency with dysuria. In the ED, initial vitals showed BP 155/86, pulse 133, RR 27, temp 98.7 F, SPO2 97% on room air. Labs show WBC 28.5, hemoglobin 11.2, platelets 152,000, sodium 133, potassium 3.1, BUN 24, creatinine 2.29, Urinalysis shows negative nitrites, moderate leukocytes, 0-5 RBC/hpf, >50 WBC/hpf, rare bacteria microscopy.  Urine and blood cultures obtained, COVID negative. Portable chest x-ray is negative for focal consolidation, edema, or effusion.  Right-sided Port-A-Cath in place. Patient was given 2 L LR, 1 L NS, IV vancomycin metronidazole, and cefepime.  The hospitalist service was consulted to admit for further evaluation and management.   Assessment & Plan:   Principal Problem:   Sepsis due to urinary tract infection (Walter [name]) Active Problems:   Rectal adenocarcinoma (HCC)   Hypertension   CKD (chronic kidney disease)   Leukocytosis   Tachycardia   Sepsis secondary to UTI, POA On presentation, tachycardic, tachypnea, fever Currently afebrile, with downtrending leukocytosis (received G-CSF) Urinalysis positive for UTI UC growing pansensitive E. Coli S/p cefepime, de-escalate to p.o. Keflex Monitor closely  Sinus tachycardia Improving Due to CKD unable to get CT angiogram chest to rule out  PE Lower extremity Dopplers which were done were negative Monitor closely  CKD stage 3B Mild bump in Cr D/C IVF as BP stable, held home chlorthalidone Daily BMP  Anemia of chronic kidney disease/malignancy Thrombocytopenia Baseline hemoglobin around 11 No signs of any bleeding, possible hemodilution Daily CBC  Hypertension Stable Continue amlodipine, held home chlorthalidone for now  Hypokalemia Replace as needed  Rectal adenocarcinoma undergoing chemotherapy Patient on active chemotherapy status post cycle 4 FOLFOX 10/29/2020.  Given G-CSF with last treatment.  Oncology, Dr. Benay Spice following.  Chemotherapy which was scheduled this week has been canceled.  Outpatient follow-up.   DVT prophylaxis: Heparin Code Status: Partial Family Communication: None at bedside Disposition:   Status is: Inpatient    Dispo: The patient is from: Home              Anticipated d/c is to: Home              Anticipated d/c date is: 1-2 days              Patient currently: Not stable for discharge.   Difficult to place patient no    Consultants:   Oncology  Procedures:  None  Antimicrobials:   IV Rocephin 11/09/2020>>> 11/09/2020  IV cefepime x1 dose  IV Flagyl x1 dose  IV vancomycin x1 dose  IV cefepime 11/09/2020 >>>>11/11/20  P.o. Keflex   Subjective: Patient denies any new complaints, had some episodes of loose stools, some nausea.  Denies any vomiting or abdominal pain, fever/chills.  Objective: Vitals:   11/10/20 2136 11/11/20 0525 11/11/20 1029 11/11/20 1345  BP: 134/70 (!) 109/58 123/72 133/72  Pulse: 95 94  85  Resp: 16  16    Temp: 98.2 F (36.8 C) 98.8 F (37.1 C)  98.6 F (37 C)  TempSrc: Oral Oral  Oral  SpO2: 97% 97%  100%  Weight:      Height:        Intake/Output Summary (Last 24 hours) at 11/11/2020 1541 Last data filed at 11/11/2020 1400 Gross per 24 hour  Intake 2161.92 ml  Output 700 ml  Net 1461.92 ml   Filed Weights   11/09/20 0522   Weight: 86.2 kg    Examination:  General: NAD   Cardiovascular: S1, S2 present  Respiratory: CTAB  Abdomen: Soft, nontender, nondistended, bowel sounds present  Musculoskeletal: No bilateral pedal edema noted  Skin: Normal  Psychiatry: Normal mood    Data Reviewed: I have personally reviewed following labs and imaging studies  CBC: Recent Labs  Lab 11/08/20 1945 11/09/20 0438 11/10/20 1054 11/11/20 0300  WBC 28.5* 29.3* 15.7* 15.7*  NEUTROABS 19.2*  --  11.0* 10.4*  HGB 11.2* 11.0* 9.8* 9.8*  HCT 33.9* 33.4* 30.3* 30.4*  MCV 84.1 84.1 84.2 84.2  PLT 152 145* 125* 130*    Basic Metabolic Panel: Recent Labs  Lab 11/08/20 1945 11/09/20 0438 11/10/20 1054 11/11/20 0300  NA 133* 136 137 138  K 3.1* 3.6 3.4* 3.4*  CL 97* 103 104 105  CO2 24 22 22 22   GLUCOSE 118* 163* 124* 103*  BUN 24* 21 21 23   CREATININE 2.29* 2.15* 2.18* 2.51*  CALCIUM 8.5* 8.5* 8.1* 8.2*  MG  --  1.4* 1.6* 2.5*    GFR: Estimated Creatinine Clearance: 29.8 mL/min (A) (by C-G formula based on SCr of 2.51 mg/dL (H)).  Liver Function Tests: Recent Labs  Lab 11/08/20 1945 11/09/20 0438 11/10/20 1054 11/11/20 0300  AST 50* 49* 57* 59*  ALT 80* 70* 66* 68*  ALKPHOS 141* 135* 115 109  BILITOT 0.6 0.7 0.6 0.5  PROT 6.6 6.4* 5.8* 6.0*  ALBUMIN 3.4* 3.2* 2.8* 2.9*    CBG: No results for input(s): GLUCAP in the last 168 hours.   Recent Results (from the past 240 hour(s))  Urine culture     Status: Abnormal   Collection Time: 11/08/20  6:50 PM   Specimen: In/Out Cath Urine  Result Value Ref Range Status   Specimen Description   Final    IN/OUT CATH URINE Performed at [facility] [address] [place], [place] [address]    Special Requests   Final    NONE Performed at [facility], [place] [address], [place], [place] [address]    Culture >=100,000 COLONIES/mL ESCHERICHIA COLI (A)  Final   Report Status 11/11/2020 FINAL  Final    Organism ID, Bacteria ESCHERICHIA COLI (A)  Final      Susceptibility   Escherichia coli - MIC*    AMPICILLIN <=2 SENSITIVE Sensitive     CEFAZOLIN <=4 SENSITIVE Sensitive     CEFEPIME <=0.12 SENSITIVE Sensitive     CEFTRIAXONE <=0.25 SENSITIVE Sensitive     CIPROFLOXACIN <=0.25 SENSITIVE Sensitive     GENTAMICIN <=1 SENSITIVE Sensitive     IMIPENEM <=0.25 SENSITIVE Sensitive     NITROFURANTOIN <=16 SENSITIVE Sensitive     TRIMETH/SULFA <=20 SENSITIVE Sensitive     AMPICILLIN/SULBACTAM <=2 SENSITIVE Sensitive     PIP/TAZO <=4 SENSITIVE Sensitive     * >=100,000 COLONIES/mL ESCHERICHIA COLI  Blood Culture (routine x 2)     Status: None (Preliminary result)   Collection Time: 11/08/20 10:57 PM  Specimen: BLOOD RIGHT HAND  Result Value Ref Range Status   Specimen Description   Final    BLOOD RIGHT HAND Performed at [facility], [place] [address] [place], [place] [address]    Special Requests   Final    BOTTLES DRAWN AEROBIC AND ANAEROBIC Blood Culture adequate volume Performed at [facility] [address], [place], [place] [address]    Culture   Final    NO GROWTH 2 DAYS Performed at [facility] [address], [place], [place] [address]    Report Status PENDING  Incomplete  Blood Culture (routine x 2)     Status: None (Preliminary result)   Collection Time: 11/08/20 11:00 PM   Specimen: BLOOD  Result Value Ref Range Status   Specimen Description   Final    BLOOD RIGHT ANTECUBITAL Performed at [facility] [address], [place], [place] [address]    Special Requests   Final    BOTTLES DRAWN AEROBIC AND ANAEROBIC Blood Culture adequate volume Performed at [facility] [address] [place], [place] [address]    Culture   Final    NO GROWTH 2 DAYS Performed at [facility] [address] [place], [place] [address]    Report Status PENDING  Incomplete         Radiology Studies: No  results found.      Scheduled Meds: . amLODipine  10 mg Oral Daily  . cephALEXin  500 mg Oral BID  . Chlorhexidine Gluconate Cloth  6 each Topical Daily  . feeding supplement  237 mL Oral BID BM  . heparin  5,000 Units Subcutaneous Q8H  . sodium chloride flush  10-40 mL Intracatheter Q12H   Continuous Infusions:    LOS: 2 days    Alma Friendly, MD [facility]  11/11/2020, 3:41 PM

## 2020-11-12 ENCOUNTER — Inpatient Hospital Stay: Payer: Medicare Other

## 2020-11-12 DIAGNOSIS — A419 Sepsis, unspecified organism: Secondary | ICD-10-CM | POA: Diagnosis not present

## 2020-11-12 DIAGNOSIS — N1832 Chronic kidney disease, stage 3b: Secondary | ICD-10-CM | POA: Diagnosis not present

## 2020-11-12 DIAGNOSIS — C20 Malignant neoplasm of rectum: Secondary | ICD-10-CM | POA: Diagnosis not present

## 2020-11-12 DIAGNOSIS — N390 Urinary tract infection, site not specified: Secondary | ICD-10-CM | POA: Diagnosis not present

## 2020-11-12 LAB — CBC WITH DIFFERENTIAL/PLATELET
Abs Immature Granulocytes: 0.66 10*3/uL — ABNORMAL HIGH (ref 0.00–0.07)
Basophils Absolute: 0.1 10*3/uL (ref 0.0–0.1)
Basophils Relative: 1 %
Eosinophils Absolute: 0.4 10*3/uL (ref 0.0–0.5)
Eosinophils Relative: 2 %
HCT: 29.8 % — ABNORMAL LOW (ref 39.0–52.0)
Hemoglobin: 9.7 g/dL — ABNORMAL LOW (ref 13.0–17.0)
Immature Granulocytes: 3 %
Lymphocytes Relative: 12 %
Lymphs Abs: 2.3 10*3/uL (ref 0.7–4.0)
MCH: 27.6 pg (ref 26.0–34.0)
MCHC: 32.6 g/dL (ref 30.0–36.0)
MCV: 84.7 fL (ref 80.0–100.0)
Monocytes Absolute: 3.6 10*3/uL — ABNORMAL HIGH (ref 0.1–1.0)
Monocytes Relative: 18 %
Neutro Abs: 12.6 10*3/uL — ABNORMAL HIGH (ref 1.7–7.7)
Neutrophils Relative %: 64 %
Platelets: 132 10*3/uL — ABNORMAL LOW (ref 150–400)
RBC: 3.52 MIL/uL — ABNORMAL LOW (ref 4.22–5.81)
RDW: 19.2 % — ABNORMAL HIGH (ref 11.5–15.5)
WBC: 19.6 10*3/uL — ABNORMAL HIGH (ref 4.0–10.5)
nRBC: 0.4 % — ABNORMAL HIGH (ref 0.0–0.2)

## 2020-11-12 LAB — BASIC METABOLIC PANEL
Anion gap: 11 (ref 5–15)
BUN: 22 mg/dL (ref 8–23)
CO2: 23 mmol/L (ref 22–32)
Calcium: 8.4 mg/dL — ABNORMAL LOW (ref 8.9–10.3)
Chloride: 103 mmol/L (ref 98–111)
Creatinine, Ser: 2.43 mg/dL — ABNORMAL HIGH (ref 0.61–1.24)
GFR, Estimated: 28 mL/min — ABNORMAL LOW (ref >60)
Glucose, Bld: 95 mg/dL (ref 70–99)
Potassium: 3.5 mmol/L (ref 3.5–5.1)
Sodium: 137 mmol/L (ref 135–145)

## 2020-11-12 MED ORDER — LOPERAMIDE HCL 2 MG PO CAPS
2.0000 mg | ORAL_CAPSULE | ORAL | Status: DC | PRN
  Administered 2020-11-12: 2 mg via ORAL
  Filled 2020-11-12: qty 1

## 2020-11-12 NOTE — Progress Notes (Signed)
PROGRESS NOTE    Walter [name]  IEP:[id number] DOB: [birth year] DOA: 11/08/2020 PCP: Default, Provider, MD    Chief Complaint  Patient presents with  . Urinary Frequency  . Fatigue    Brief Narrative:  Walter [name] is a 70 y.o. male with medical history significant for rectal adenocarcinoma undergoing chemotherapy with FOLFOX s/p cycle 4 on 10/29/2020, hypertension, CKD stage III/IV who presents to the ED for evaluation of dysuria and weakness. Patient states he completed his last chemotherapy 10/29/2020 and responded similar to his prior sessions.  Over the last few days he has just become generally weak with some episodes of nausea.  He has had increased urinary frequency with dysuria. In the ED, initial vitals showed BP 155/86, pulse 133, RR 27, temp 98.7 F, SPO2 97% on room air. Labs show WBC 28.5, hemoglobin 11.2, platelets 152,000, sodium 133, potassium 3.1, BUN 24, creatinine 2.29, Urinalysis shows negative nitrites, moderate leukocytes, 0-5 RBC/hpf, >50 WBC/hpf, rare bacteria microscopy.  Urine and blood cultures obtained, COVID negative. Portable chest x-ray is negative for focal consolidation, edema, or effusion.  Right-sided Port-A-Cath in place. Patient was given 2 L LR, 1 L NS, IV vancomycin metronidazole, and cefepime.  The hospitalist service was consulted to admit for further evaluation and management.   Assessment & Plan:   Principal Problem:   Sepsis due to urinary tract infection ([place]) Active Problems:   Rectal adenocarcinoma ([facility])   Hypertension   CKD (chronic kidney disease)   Leukocytosis   Tachycardia   Sepsis secondary to UTI, POA E.coli bacteremia  On presentation, tachycardic, tachypnea, fever Currently afebrile, with downtrending leukocytosis (received G-CSF) Urinalysis positive for UTI, UC growing pansensitive E. Coli Culture grew E. coli, susceptibilities pending Switch to IV ceftriaxone for now Monitor closely  Sinus tachycardia Improving Due  to CKD unable to get CT angiogram chest to rule out PE Lower extremity Dopplers which were done were negative Monitor closely  CKD stage 3B Mild bump in Cr D/C IVF as BP stable, held home chlorthalidone Daily BMP  Anemia of chronic kidney disease/malignancy Thrombocytopenia Baseline hemoglobin around 11 No signs of any bleeding, possible hemodilution Daily CBC  Hypertension Stable Continue amlodipine, held home chlorthalidone for now  Hypokalemia Replace as needed  Rectal adenocarcinoma undergoing chemotherapy Patient on active chemotherapy status post cycle 4 FOLFOX 10/29/2020.  Given G-CSF with last treatment.  Oncology, Dr. Benay Spice following.  Chemotherapy which was scheduled this week has been canceled.  Outpatient follow-up.   DVT prophylaxis: Heparin Code Status: Partial Family Communication: None at bedside Disposition:   Status is: Inpatient    Dispo: The patient is from: Home              Anticipated d/c is to: Home              Anticipated d/c date is: 1-2 days              Patient currently: Not stable for discharge.   Difficult to place patient no    Consultants:   Oncology  Procedures:  None  Antimicrobials:   IV Rocephin 11/09/2020>>> 11/09/2020  IV cefepime x1 dose  IV Flagyl x1 dose  IV vancomycin x1 dose  IV cefepime 11/09/2020 >>>>11/11/20  P.o. Keflex   Subjective: Patient denies any new complaints.  Objective: Vitals:   11/11/20 1734 11/11/20 2345 11/12/20 0550 11/12/20 1313  BP: 131/83 135/87 126/66 (!) 146/73  Pulse: 93 95 83 90  Resp:  16 17 17   Temp: 98 F (  36.7 C) 98.7 F (37.1 C) 98.7 F (37.1 C) (!) 97.5 F (36.4 C)  TempSrc: Oral Oral Oral Oral  SpO2: 99% 100% 98% 98%  Weight:      Height:        Intake/Output Summary (Last 24 hours) at 11/12/2020 1821 Last data filed at 11/12/2020 1800 Gross per 24 hour  Intake 940 ml  Output 1125 ml  Net -185 ml   Filed Weights   11/09/20 0522  Weight: 86.2 kg     Examination:  General: NAD   Cardiovascular: S1, S2 present  Respiratory: CTAB  Abdomen: Soft, nontender, nondistended, bowel sounds present  Musculoskeletal: No bilateral pedal edema noted  Skin: Normal  Psychiatry: Normal mood    Data Reviewed: I have personally reviewed following labs and imaging studies  CBC: Recent Labs  Lab 11/08/20 1945 11/09/20 0438 11/10/20 1054 11/11/20 0300 11/12/20 0358  WBC 28.5* 29.3* 15.7* 15.7* 19.6*  NEUTROABS 19.2*  --  11.0* 10.4* 12.6*  HGB 11.2* 11.0* 9.8* 9.8* 9.7*  HCT 33.9* 33.4* 30.3* 30.4* 29.8*  MCV 84.1 84.1 84.2 84.2 84.7  PLT 152 145* 125* 130* 132*    Basic Metabolic Panel: Recent Labs  Lab 11/08/20 1945 11/09/20 0438 11/10/20 1054 11/11/20 0300 11/12/20 0358  NA 133* 136 137 138 137  K 3.1* 3.6 3.4* 3.4* 3.5  CL 97* 103 104 105 103  CO2 24 22 22 22 23   GLUCOSE 118* 163* 124* 103* 95  BUN 24* 21 21 23 22   CREATININE 2.29* 2.15* 2.18* 2.51* 2.43*  CALCIUM 8.5* 8.5* 8.1* 8.2* 8.4*  MG  --  1.4* 1.6* 2.5*  --     GFR: Estimated Creatinine Clearance: 30.8 mL/min (A) (by C-G formula based on SCr of 2.43 mg/dL (H)).  Liver Function Tests: Recent Labs  Lab 11/08/20 1945 11/09/20 0438 11/10/20 1054 11/11/20 0300  AST 50* 49* 57* 59*  ALT 80* 70* 66* 68*  ALKPHOS 141* 135* 115 109  BILITOT 0.6 0.7 0.6 0.5  PROT 6.6 6.4* 5.8* 6.0*  ALBUMIN 3.4* 3.2* 2.8* 2.9*    CBG: No results for input(s): GLUCAP in the last 168 hours.   Recent Results (from the past 240 hour(s))  Urine culture     Status: Abnormal   Collection Time: 11/08/20  6:50 PM   Specimen: In/Out Cath Urine  Result Value Ref Range Status   Specimen Description   Final    IN/OUT CATH URINE Performed at [facility] [address], [place], [place] [address]    Special Requests   Final    NONE Performed at [facility], [place] [address] [place], [place] [address]    Culture >=100,000  COLONIES/mL ESCHERICHIA COLI (A)  Final   Report Status 11/11/2020 FINAL  Final   Organism ID, Bacteria ESCHERICHIA COLI (A)  Final      Susceptibility   Escherichia coli - MIC*    AMPICILLIN <=2 SENSITIVE Sensitive     CEFAZOLIN <=4 SENSITIVE Sensitive     CEFEPIME <=0.12 SENSITIVE Sensitive     CEFTRIAXONE <=0.25 SENSITIVE Sensitive     CIPROFLOXACIN <=0.25 SENSITIVE Sensitive     GENTAMICIN <=1 SENSITIVE Sensitive     IMIPENEM <=0.25 SENSITIVE Sensitive     NITROFURANTOIN <=16 SENSITIVE Sensitive     TRIMETH/SULFA <=20 SENSITIVE Sensitive     AMPICILLIN/SULBACTAM <=2 SENSITIVE Sensitive     PIP/TAZO <=4 SENSITIVE Sensitive     * >=100,000 COLONIES/mL ESCHERICHIA COLI  Blood Culture (  routine x 2)     Status: Abnormal (Preliminary result)   Collection Time: 11/08/20 10:57 PM   Specimen: BLOOD RIGHT HAND  Result Value Ref Range Status   Specimen Description   Final    BLOOD RIGHT HAND Performed at [facility], [place] [address], [place], [place] [address]    Special Requests   Final    BOTTLES DRAWN AEROBIC AND ANAEROBIC Blood Culture adequate volume Performed at [facility] [address] [place], [place] [address]    Culture  Setup Time   Final    GRAM NEGATIVE RODS ANAEROBIC BOTTLE ONLY CRITICAL RESULT CALLED TO, READ BACK BY AND VERIFIED WITH: Colin Rhein [facility] 11/11/20 2112 JDW    Culture (A)  Final    ESCHERICHIA COLI SUSCEPTIBILITIES TO FOLLOW Performed at [facility], [place] [address], [place], [place] [address]    Report Status PENDING  Incomplete  Blood Culture ID Panel (Reflexed)     Status: Abnormal   Collection Time: 11/08/20 10:57 PM  Result Value Ref Range Status   Enterococcus faecalis NOT DETECTED NOT DETECTED Final   Enterococcus Faecium NOT DETECTED NOT DETECTED Final   Listeria monocytogenes NOT DETECTED NOT DETECTED Final   Staphylococcus species NOT DETECTED NOT DETECTED Final   Staphylococcus aureus (BCID) NOT DETECTED  NOT DETECTED Final   Staphylococcus epidermidis NOT DETECTED NOT DETECTED Final   Staphylococcus lugdunensis NOT DETECTED NOT DETECTED Final   Streptococcus species NOT DETECTED NOT DETECTED Final   Streptococcus agalactiae NOT DETECTED NOT DETECTED Final   Streptococcus pneumoniae NOT DETECTED NOT DETECTED Final   Streptococcus pyogenes NOT DETECTED NOT DETECTED Final   A.calcoaceticus-baumannii NOT DETECTED NOT DETECTED Final   Bacteroides fragilis NOT DETECTED NOT DETECTED Final   Enterobacterales DETECTED (A) NOT DETECTED Final    Comment: Enterobacterales represent a large order of gram negative bacteria, not a single organism. CRITICAL RESULT CALLED TO, READ BACK BY AND VERIFIED WITH: Jerilynn Mages BELL PHARMD 11/11/20 2112 JDW    Enterobacter cloacae complex NOT DETECTED NOT DETECTED Final   Escherichia coli DETECTED (A) NOT DETECTED Final    Comment: CRITICAL RESULT CALLED TO, READ BACK BY AND VERIFIED WITH: Colin Rhein PHARMD 11/11/20 2112 JDW    Klebsiella aerogenes NOT DETECTED NOT DETECTED Final   Klebsiella oxytoca NOT DETECTED NOT DETECTED Final   Klebsiella pneumoniae NOT DETECTED NOT DETECTED Final   Proteus species NOT DETECTED NOT DETECTED Final   Salmonella species NOT DETECTED NOT DETECTED Final   Serratia marcescens NOT DETECTED NOT DETECTED Final   Haemophilus influenzae NOT DETECTED NOT DETECTED Final   Neisseria meningitidis NOT DETECTED NOT DETECTED Final   Pseudomonas aeruginosa NOT DETECTED NOT DETECTED Final   Stenotrophomonas maltophilia NOT DETECTED NOT DETECTED Final   Candida albicans NOT DETECTED NOT DETECTED Final   Candida auris NOT DETECTED NOT DETECTED Final   Candida glabrata NOT DETECTED NOT DETECTED Final   Candida krusei NOT DETECTED NOT DETECTED Final   Candida parapsilosis NOT DETECTED NOT DETECTED Final   Candida tropicalis NOT DETECTED NOT DETECTED Final   Cryptococcus neoformans/gattii NOT DETECTED NOT DETECTED Final   CTX-M ESBL NOT DETECTED NOT  DETECTED Final   Carbapenem resistance IMP NOT DETECTED NOT DETECTED Final   Carbapenem resistance KPC NOT DETECTED NOT DETECTED Final   Carbapenem resistance NDM NOT DETECTED NOT DETECTED Final   Carbapenem resist OXA 48 LIKE NOT DETECTED NOT DETECTED Final   Carbapenem resistance VIM NOT DETECTED NOT DETECTED Final  Comment: Performed at [facility], [place] [address], [place], [place] [address]  Blood Culture (routine x 2)     Status: None (Preliminary result)   Collection Time: 11/08/20 11:00 PM   Specimen: BLOOD  Result Value Ref Range Status   Specimen Description   Final    BLOOD RIGHT ANTECUBITAL Performed at [facility] [address], [place], [place] [address]    Special Requests   Final    BOTTLES DRAWN AEROBIC AND ANAEROBIC Blood Culture adequate volume Performed at [facility] [address] [place], [place] [address]    Culture   Final    NO GROWTH 3 DAYS Performed at [facility], [place] [address], [place], [place] [address]    Report Status PENDING  Incomplete         Radiology Studies: No results found.      Scheduled Meds: . amLODipine  10 mg Oral Daily  . Chlorhexidine Gluconate Cloth  6 each Topical Daily  . feeding supplement  237 mL Oral BID BM  . heparin  5,000 Units Subcutaneous Q8H  . sodium chloride flush  10-40 mL Intracatheter Q12H   Continuous Infusions: . cefTRIAXone (ROCEPHIN)  IV 2 g (11/11/20 2251)     LOS: 3 days    Alma Friendly, MD [facility]  11/12/2020, 6:21 PM

## 2020-11-13 DIAGNOSIS — B9620 Unspecified Escherichia coli [E. coli] as the cause of diseases classified elsewhere: Secondary | ICD-10-CM

## 2020-11-13 DIAGNOSIS — I10 Essential (primary) hypertension: Secondary | ICD-10-CM | POA: Diagnosis not present

## 2020-11-13 DIAGNOSIS — N1832 Chronic kidney disease, stage 3b: Secondary | ICD-10-CM | POA: Diagnosis not present

## 2020-11-13 DIAGNOSIS — C20 Malignant neoplasm of rectum: Secondary | ICD-10-CM | POA: Diagnosis not present

## 2020-11-13 DIAGNOSIS — R7881 Bacteremia: Secondary | ICD-10-CM

## 2020-11-13 DIAGNOSIS — A419 Sepsis, unspecified organism: Secondary | ICD-10-CM | POA: Diagnosis not present

## 2020-11-13 LAB — CBC WITH DIFFERENTIAL/PLATELET
Abs Immature Granulocytes: 0.66 10*3/uL — ABNORMAL HIGH (ref 0.00–0.07)
Basophils Absolute: 0.1 10*3/uL (ref 0.0–0.1)
Basophils Relative: 1 %
Eosinophils Absolute: 0.4 10*3/uL (ref 0.0–0.5)
Eosinophils Relative: 2 %
HCT: 29.4 % — ABNORMAL LOW (ref 39.0–52.0)
Hemoglobin: 9.5 g/dL — ABNORMAL LOW (ref 13.0–17.0)
Immature Granulocytes: 4 %
Lymphocytes Relative: 16 %
Lymphs Abs: 3 10*3/uL (ref 0.7–4.0)
MCH: 27.4 pg (ref 26.0–34.0)
MCHC: 32.3 g/dL (ref 30.0–36.0)
MCV: 84.7 fL (ref 80.0–100.0)
Monocytes Absolute: 3.3 10*3/uL — ABNORMAL HIGH (ref 0.1–1.0)
Monocytes Relative: 17 %
Neutro Abs: 11.5 10*3/uL — ABNORMAL HIGH (ref 1.7–7.7)
Neutrophils Relative %: 60 %
Platelets: 149 10*3/uL — ABNORMAL LOW (ref 150–400)
RBC: 3.47 MIL/uL — ABNORMAL LOW (ref 4.22–5.81)
RDW: 19.2 % — ABNORMAL HIGH (ref 11.5–15.5)
WBC: 19.1 10*3/uL — ABNORMAL HIGH (ref 4.0–10.5)
nRBC: 0.3 % — ABNORMAL HIGH (ref 0.0–0.2)

## 2020-11-13 LAB — CULTURE, BLOOD (ROUTINE X 2): Special Requests: ADEQUATE

## 2020-11-13 LAB — BASIC METABOLIC PANEL
Anion gap: 9 (ref 5–15)
BUN: 21 mg/dL (ref 8–23)
CO2: 27 mmol/L (ref 22–32)
Calcium: 8.5 mg/dL — ABNORMAL LOW (ref 8.9–10.3)
Chloride: 102 mmol/L (ref 98–111)
Creatinine, Ser: 2.29 mg/dL — ABNORMAL HIGH (ref 0.61–1.24)
GFR, Estimated: 30 mL/min — ABNORMAL LOW (ref >60)
Glucose, Bld: 90 mg/dL (ref 70–99)
Potassium: 3.4 mmol/L — ABNORMAL LOW (ref 3.5–5.1)
Sodium: 138 mmol/L (ref 135–145)

## 2020-11-13 MED ORDER — HEPARIN SOD (PORK) LOCK FLUSH 100 UNIT/ML IV SOLN
500.0000 [IU] | INTRAVENOUS | Status: AC | PRN
  Administered 2020-11-13: 500 [IU]
  Filled 2020-11-13: qty 5

## 2020-11-13 MED ORDER — CEPHALEXIN 500 MG PO CAPS: 500.0000 mg | ORAL_CAPSULE | Freq: Four times a day (QID) | ORAL | 0 refills | Status: AC

## 2020-11-13 MED ORDER — POTASSIUM CHLORIDE CRYS ER 20 MEQ PO TBCR
40.0000 meq | EXTENDED_RELEASE_TABLET | Freq: Once | ORAL | Status: AC
  Administered 2020-11-13: 40 meq via ORAL
  Filled 2020-11-13: qty 2

## 2020-11-13 NOTE — Progress Notes (Signed)
Pt alert and oriented. D/C instructions given, pt d/cd home. 

## 2020-11-13 NOTE — Care Management Important Message (Signed)
Important Message  Patient Details IM Letter given to the Patient Name: Walter [name] MRN: [id number] Date of Birth: Nov 08, 1950   Medicare Important Message Given:  Yes     Kerin Salen 11/13/2020, 12:07 PM

## 2020-11-13 NOTE — Discharge Summary (Signed)
Discharge Summary  Walter [name] FYB:[id number] DOB: [birth year]  PCP: Default, Provider, MD  Admit date: 11/08/2020 Discharge date: 11/13/2020  Time spent: 40 mins  Recommendations for Outpatient Follow-up:  1. Follow-up with oncology/PCP as scheduled  Discharge Diagnoses:  Active Hospital Problems   Diagnosis Date Noted  . Sepsis due to urinary tract infection (Walter [name]) 11/08/2020  . Leukocytosis   . Tachycardia   . Hypertension   . CKD (chronic kidney disease)   . 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    Discharge Condition: Stable  Diet recommendation: As tolerated  Vitals:   11/13/20 0558 11/13/20 1338  BP: 133/65 131/76  Pulse: 93 90  Resp: 18 18  Temp: 97.9 F (36.6 C) 97.6 F (36.4 C)  SpO2: 99% 98%    History of present illness:  Walter [name] a 70 y.o.malewith medical history significant forrectal adenocarcinoma undergoing chemotherapy with FOLFOX s/p cycle 4 on 10/29/2020, hypertension, CKD stage III/IV who presents to the ED for evaluation of dysuria and weakness. Patient states he completed his last chemotherapy 10/29/2020 and responded similar to his prior sessions. Over the last few days he has just become generally weak with some episodes of nausea. He has had increased urinary frequency with dysuria. In the ED, initial vitals showed BP 155/86, pulse 133, RR 27, temp 98.7 F, SPO2 97% on room air. Labs show WBC 28.5, hemoglobin 11.2, platelets 152,000, sodium 133, potassium 3.1, BUN 24, creatinine 2.29, Urinalysis shows negative nitrites, moderate leukocytes, 0-5 RBC/hpf, >50 WBC/hpf, rare bacteria microscopy. Urine and blood cultures obtained, COVID negative. Portable chest x-ray is negative for focal consolidation, edema, or effusion. Right-sided Port-A-Cath in place. Patient was given 2 L LR, 1 L NS, IV vancomycin metronidazole, and cefepime. The hospitalist service was consulted to  admit for further evaluation and management.    Today, patient denies any new complaints, reports feeling much better, very eager to be discharged.  Patient to follow-up with [name] oncology for further management.    Hospital Course:  Principal Problem:   Sepsis due to urinary tract infection (Walter [name]) Active Problems:   Rectal adenocarcinoma (Walter [name])   Hypertension   CKD (chronic kidney disease)   Leukocytosis   Tachycardia   Sepsis secondary to UTI, POA E.coli bacteremia  On presentation, tachycardic, tachypnea, fever Currently afebrile, with leukocytosis (received G-CSF) Urinalysis positive for UTI, UC grew pansensitive E. Coli Blood culture grew E. Coli, repeat NGTD S/p IV ceftriaxone & cefepime, switch to p.o. Keflex to complete 10 days total of antibiotics  Sinus tachycardia Improved Due to CKD unable to get CT angiogram chest to rule out PE Lower extremity Dopplers which were done were negative  CKD stage 3B Stable  Anemia of chronic kidney disease/malignancy Thrombocytopenia Baseline hemoglobin around 11 No signs of any bleeding, possible hemodilution Follow-up with oncology  Hypertension Stable Continue amlodipine, chlorthalidone  Hypokalemia Replaced as needed  Rectal adenocarcinoma undergoing chemotherapy Patient on active chemotherapy status post cycle 4 FOLFOX 10/29/2020.  Given G-CSF with last treatment.  Follow-up with oncology, Walter [name]         Malnutrition Type:  Nutrition Problem: Increased nutrient needs Etiology: cancer and cancer related treatments   Malnutrition Characteristics:  Signs/Symptoms: estimated needs   Nutrition Interventions:  Interventions: Ensure Enlive (each supplement provides 350kcal and 20 grams of protein)   Estimated body mass index is 28.06 kg/m as calculated from the following:   Height as of this encounter: 5\' 9"  (  1.753 m).   Weight as of this encounter: 86.2 kg.     Procedures:  None  Consultations:  Oncology  Discharge Exam: BP 131/76 (BP Location: Right Arm)   Pulse 90   Temp 97.6 F (36.4 C) (Oral)   Resp 18   Ht 5\' 9"  (1.753 m)   Wt 86.2 kg   SpO2 98%   BMI 28.06 kg/m   General: NAD Cardiovascular: S1, S2 present Respiratory: CTAB     Discharge Instructions You were cared for by a hospitalist during your hospital stay. If you have any questions about your discharge medications or the care you received while you were in the hospital after you are discharged, you can call the unit and asked to speak with the hospitalist on call if the hospitalist that took care of you is not available. Once you are discharged, your primary care physician will handle any further medical issues. Please note that NO REFILLS for any discharge medications will be authorized once you are discharged, as it is imperative that you return to your primary care physician (or establish a relationship with a primary care physician if you do not have one) for your aftercare needs so that they can reassess your need for medications and monitor your lab values.   Allergies as of 11/13/2020   No Known Allergies     Medication List    TAKE these medications   acetaminophen 500 MG tablet Commonly known as: TYLENOL Take 500 mg by mouth 2 (two) times daily.   amLODipine 10 MG tablet Commonly known as: NORVASC Take 10 mg by mouth daily.   aspirin 81 MG EC tablet Take 81 mg by mouth every Monday.   cephALEXin 500 MG capsule Commonly known as: KEFLEX Take 1 capsule (500 mg total) by mouth 4 (four) times daily for 5 days.   chlorthalidone 25 MG tablet Commonly known as: HYGROTON Take 25 mg by mouth daily.   D3-1000 PO Take 1 tablet by mouth daily.   lidocaine-prilocaine cream Commonly known as: EMLA Apply to port site 1-2 hours prior to use What changed:   how much to take  how to take this  when to take this  reasons to take  this  additional instructions   multivitamin with minerals Tabs tablet Take 1 tablet by mouth every Monday.   potassium chloride SA 20 MEQ tablet Commonly known as: KLOR-CON Take 1 tablet (20 mEq total) by mouth 2 (two) times daily.   prochlorperazine 10 MG tablet Commonly known as: COMPAZINE Take 1 tablet (10 mg total) by mouth every 6 (six) hours as needed for nausea. What changed:   when to take this  additional instructions   Vitamin B-12 5000 MCG Subl Place 5,000 mcg under the tongue 2 (two) times daily.      No Known Allergies    The results of significant diagnostics from this hospitalization (including imaging, microbiology, ancillary and laboratory) are listed below for reference.    Significant Diagnostic Studies: DG Chest Port 1 View  Result Date: 11/08/2020 CLINICAL DATA:  Questionable sepsis. EXAM: PORTABLE CHEST 1 VIEW COMPARISON:  None. FINDINGS: A right Port-A-Cath terminates in the central SVC. No pneumothorax. There is elevation left hemidiaphragm. The lungs are clear. The cardiomediastinal silhouette is unremarkable. No other acute abnormalities. IMPRESSION: No active disease. Electronically Signed   By: Dorise Bullion III M.D   On: 11/08/2020 19:22   VAS [place] LOWER EXTREMITY VENOUS (DVT)  Result Date: 11/09/2020  Lower Venous DVT  Study Indications: Edema.  Comparison Study: No previous exams Performing Technologist: Rogelia Rohrer  Examination Guidelines: A complete evaluation includes B-mode imaging, spectral Doppler, color Doppler, and power Doppler as needed of all accessible portions of each vessel. Bilateral testing is considered an integral part of a complete examination. Limited examinations for reoccurring indications may be performed as noted. The reflux portion of the exam is performed with the patient in reverse Trendelenburg.  +---------+---------------+---------+-----------+----------+--------------+ RIGHT     CompressibilityPhasicitySpontaneityPropertiesThrombus Aging +---------+---------------+---------+-----------+----------+--------------+ CFV      Full           Yes      Yes                                 +---------+---------------+---------+-----------+----------+--------------+ SFJ      Full                                                        +---------+---------------+---------+-----------+----------+--------------+ FV Prox  Full           Yes      Yes                                 +---------+---------------+---------+-----------+----------+--------------+ FV Mid   Full           Yes      Yes                                 +---------+---------------+---------+-----------+----------+--------------+ FV DistalFull           Yes      Yes                                 +---------+---------------+---------+-----------+----------+--------------+ PFV      Full                                                        +---------+---------------+---------+-----------+----------+--------------+ POP      Full           Yes      Yes                                 +---------+---------------+---------+-----------+----------+--------------+ PTV      Full                                                        +---------+---------------+---------+-----------+----------+--------------+ PERO     Full                                                        +---------+---------------+---------+-----------+----------+--------------+   +---------+---------------+---------+-----------+----------+--------------+  LEFT     CompressibilityPhasicitySpontaneityPropertiesThrombus Aging +---------+---------------+---------+-----------+----------+--------------+ CFV      Full           Yes      Yes                                 +---------+---------------+---------+-----------+----------+--------------+ SFJ      Full                                                         +---------+---------------+---------+-----------+----------+--------------+ FV Prox  Full           Yes      Yes                                 +---------+---------------+---------+-----------+----------+--------------+ FV Mid   Full           Yes      Yes                                 +---------+---------------+---------+-----------+----------+--------------+ FV DistalFull           Yes      Yes                                 +---------+---------------+---------+-----------+----------+--------------+ PFV      Full                                                        +---------+---------------+---------+-----------+----------+--------------+ POP      Full           Yes      Yes                                 +---------+---------------+---------+-----------+----------+--------------+ PTV      Full                                                        +---------+---------------+---------+-----------+----------+--------------+ PERO     Full                                                        +---------+---------------+---------+-----------+----------+--------------+     Summary: BILATERAL: - No evidence of deep vein thrombosis seen in the lower extremities, bilaterally. - No evidence of superficial venous thrombosis in the lower extremities, bilaterally. -No evidence of popliteal cyst, bilaterally.   *See table(s) above for measurements and observations. Electronically signed by Ruta Hinds MD on 11/09/2020 at 5:52:26 PM.    Final  Microbiology: Recent Results (from the past 240 hour(s))  Urine culture     Status: Abnormal   Collection Time: 11/08/20  6:50 PM   Specimen: In/Out Cath Urine  Result Value Ref Range Status   Specimen Description   Final    IN/OUT CATH URINE Performed at [facility], [place] [address] [place], [place] [address]    Special Requests   Final    NONE Performed at [facility] [place] [address], [place], [place] [address]    Culture >=100,000 COLONIES/mL ESCHERICHIA COLI (A)  Final   Report Status 11/11/2020 FINAL  Final   Organism ID, Bacteria ESCHERICHIA COLI (A)  Final      Susceptibility   Escherichia coli - MIC*    AMPICILLIN <=2 SENSITIVE Sensitive     CEFAZOLIN <=4 SENSITIVE Sensitive     CEFEPIME <=0.12 SENSITIVE Sensitive     CEFTRIAXONE <=0.25 SENSITIVE Sensitive     CIPROFLOXACIN <=0.25 SENSITIVE Sensitive     GENTAMICIN <=1 SENSITIVE Sensitive     IMIPENEM <=0.25 SENSITIVE Sensitive     NITROFURANTOIN <=16 SENSITIVE Sensitive     TRIMETH/SULFA <=20 SENSITIVE Sensitive     AMPICILLIN/SULBACTAM <=2 SENSITIVE Sensitive     PIP/TAZO <=4 SENSITIVE Sensitive     * >=100,000 COLONIES/mL ESCHERICHIA COLI  Blood Culture (routine x 2)     Status: Abnormal   Collection Time: 11/08/20 10:57 PM   Specimen: BLOOD RIGHT HAND  Result Value Ref Range Status   Specimen Description   Final    BLOOD RIGHT HAND Performed at [facility], [place] [address], [place], [place] [address]    Special Requests   Final    BOTTLES DRAWN AEROBIC AND ANAEROBIC Blood Culture adequate volume Performed at [facility] [address], [place], [place] [address]    Culture  Setup Time   Final    GRAM NEGATIVE RODS ANAEROBIC BOTTLE ONLY CRITICAL RESULT CALLED TO, READ BACK BY AND VERIFIED WITH: Colin Rhein [facility] 11/11/20 2112 JDW Performed at [facility], [address], [place], [place] [address]    Culture ESCHERICHIA COLI (A)  Final   Report Status 11/13/2020 FINAL  Final   Organism ID, Bacteria ESCHERICHIA COLI  Final      Susceptibility   Escherichia coli - MIC*    AMPICILLIN 4 SENSITIVE Sensitive     CEFAZOLIN <=4 SENSITIVE Sensitive     CEFEPIME <=0.12 SENSITIVE Sensitive     CEFTAZIDIME <=1 SENSITIVE Sensitive     CEFTRIAXONE <=0.25 SENSITIVE Sensitive     CIPROFLOXACIN <=0.25 SENSITIVE Sensitive     GENTAMICIN <=1  SENSITIVE Sensitive     IMIPENEM <=0.25 SENSITIVE Sensitive     TRIMETH/SULFA <=20 SENSITIVE Sensitive     AMPICILLIN/SULBACTAM <=2 SENSITIVE Sensitive     PIP/TAZO <=4 SENSITIVE Sensitive     * ESCHERICHIA COLI  Blood Culture ID Panel (Reflexed)     Status: Abnormal   Collection Time: 11/08/20 10:57 PM  Result Value Ref Range Status   Enterococcus faecalis NOT DETECTED NOT DETECTED Final   Enterococcus Faecium NOT DETECTED NOT DETECTED Final   Listeria monocytogenes NOT DETECTED NOT DETECTED Final   Staphylococcus species NOT DETECTED NOT DETECTED Final   Staphylococcus aureus (BCID) NOT DETECTED NOT DETECTED Final   Staphylococcus epidermidis NOT DETECTED NOT DETECTED Final   Staphylococcus lugdunensis NOT DETECTED NOT DETECTED Final   Streptococcus species NOT DETECTED NOT DETECTED Final   Streptococcus agalactiae NOT  DETECTED NOT DETECTED Final   Streptococcus pneumoniae NOT DETECTED NOT DETECTED Final   Streptococcus pyogenes NOT DETECTED NOT DETECTED Final   A.calcoaceticus-baumannii NOT DETECTED NOT DETECTED Final   Bacteroides fragilis NOT DETECTED NOT DETECTED Final   Enterobacterales DETECTED (A) NOT DETECTED Final    Comment: Enterobacterales represent a large order of gram negative bacteria, not a single organism. CRITICAL RESULT CALLED TO, READ BACK BY AND VERIFIED WITH: Jerilynn Mages BELL PHARMD 11/11/20 2112 JDW    Enterobacter cloacae complex NOT DETECTED NOT DETECTED Final   Escherichia coli DETECTED (A) NOT DETECTED Final    Comment: CRITICAL RESULT CALLED TO, READ BACK BY AND VERIFIED WITH: Colin Rhein PHARMD 11/11/20 2112 JDW    Klebsiella aerogenes NOT DETECTED NOT DETECTED Final   Klebsiella oxytoca NOT DETECTED NOT DETECTED Final   Klebsiella pneumoniae NOT DETECTED NOT DETECTED Final   Proteus species NOT DETECTED NOT DETECTED Final   Salmonella species NOT DETECTED NOT DETECTED Final   Serratia marcescens NOT DETECTED NOT DETECTED Final   Haemophilus influenzae NOT  DETECTED NOT DETECTED Final   Neisseria meningitidis NOT DETECTED NOT DETECTED Final   Pseudomonas aeruginosa NOT DETECTED NOT DETECTED Final   Stenotrophomonas maltophilia NOT DETECTED NOT DETECTED Final   Candida albicans NOT DETECTED NOT DETECTED Final   Candida auris NOT DETECTED NOT DETECTED Final   Candida glabrata NOT DETECTED NOT DETECTED Final   Candida krusei NOT DETECTED NOT DETECTED Final   Candida parapsilosis NOT DETECTED NOT DETECTED Final   Candida tropicalis NOT DETECTED NOT DETECTED Final   Cryptococcus neoformans/gattii NOT DETECTED NOT DETECTED Final   CTX-M ESBL NOT DETECTED NOT DETECTED Final   Carbapenem resistance IMP NOT DETECTED NOT DETECTED Final   Carbapenem resistance KPC NOT DETECTED NOT DETECTED Final   Carbapenem resistance NDM NOT DETECTED NOT DETECTED Final   Carbapenem resist OXA 48 LIKE NOT DETECTED NOT DETECTED Final   Carbapenem resistance VIM NOT DETECTED NOT DETECTED Final    Comment: Performed at [facility], [address], [place], [place] [address]  Blood Culture (routine x 2)     Status: None (Preliminary result)   Collection Time: 11/08/20 11:00 PM   Specimen: BLOOD  Result Value Ref Range Status   Specimen Description   Final    BLOOD RIGHT ANTECUBITAL Performed at [facility] [address] [place], [place] [address]    Special Requests   Final    BOTTLES DRAWN AEROBIC AND ANAEROBIC Blood Culture adequate volume Performed at [facility] [address], [place], [place] [address]    Culture   Final    NO GROWTH 4 DAYS Performed at [facility], [place] [address], [place], [place] [address]    Report Status PENDING  Incomplete  Culture, blood (routine x 2)     Status: None (Preliminary result)   Collection Time: 11/12/20  7:49 AM   Specimen: BLOOD  Result Value Ref Range Status   Specimen Description   Final    BLOOD LEFT ANTECUBITAL Performed at [facility] [address], [place], [place] [address]    Special Requests   Final    BOTTLES DRAWN AEROBIC AND ANAEROBIC Blood Culture adequate volume Performed at [facility] [address] [place], [place] [address]    Culture   Final    NO GROWTH 1 DAY Performed at [facility], [place] [address], [place], [place] [address]  Report Status PENDING  Incomplete  Culture, blood (routine x 2)     Status: None (Preliminary result)   Collection Time: 11/12/20  7:49 AM   Specimen: BLOOD  Result Value Ref Range Status   Specimen Description   Final    BLOOD RIGHT ANTECUBITAL Performed at [facility] [address] [place], [place] [address]    Special Requests   Final    BOTTLES DRAWN AEROBIC AND ANAEROBIC Blood Culture adequate volume Performed at [facility] [address] [place], [place] [address]    Culture   Final    NO GROWTH 1 DAY Performed at [facility], [place] [address], [place],  [address]    Report Status PENDING  Incomplete     Labs: Basic Metabolic Panel: Recent Labs  Lab 11/09/20 0438 11/10/20 1054 11/11/20 0300 11/12/20 0358 11/13/20 0300  NA 136 137 138 137 138  K 3.6 3.4* 3.4* 3.5 3.4*  CL 103 104 105 103 102  CO2 22 22 22 23 27   GLUCOSE 163* 124* 103* 95 90  BUN 21 21 23 22 21   CREATININE 2.15* 2.18* 2.51* 2.43* 2.29*  CALCIUM 8.5* 8.1* 8.2* 8.4* 8.5*  MG 1.4* 1.6* 2.5*  --   --    Liver Function Tests: Recent Labs  Lab 11/08/20 1945 11/09/20 0438 11/10/20 1054 11/11/20 0300  AST 50* 49* 57* 59*  ALT 80* 70* 66* 68*  ALKPHOS 141* 135* 115 109  BILITOT 0.6 0.7 0.6 0.5  PROT 6.6 6.4* 5.8* 6.0*  ALBUMIN 3.4* 3.2* 2.8* 2.9*   No results for input(s): LIPASE, AMYLASE in the last 168 hours. No results for input(s): AMMONIA in the last 168 hours. CBC: Recent Labs  Lab 11/08/20 1945 11/09/20 0438 11/10/20 1054 11/11/20 0300 11/12/20 0358 11/13/20 0300  WBC 28.5*  29.3* 15.7* 15.7* 19.6* 19.1*  NEUTROABS 19.2*  --  11.0* 10.4* 12.6* 11.5*  HGB 11.2* 11.0* 9.8* 9.8* 9.7* 9.5*  HCT 33.9* 33.4* 30.3* 30.4* 29.8* 29.4*  MCV 84.1 84.1 84.2 84.2 84.7 84.7  PLT 152 145* 125* 130* 132* 149*   Cardiac Enzymes: No results for input(s): CKTOTAL, CKMB, CKMBINDEX, TROPONINI in the last 168 hours. BNP: BNP (last 3 results) No results for input(s): BNP in the last 8760 hours.  ProBNP (last 3 results) No results for input(s): PROBNP in the last 8760 hours.  CBG: No results for input(s): GLUCAP in the last 168 hours.     Signed:  Alma Friendly, MD [facility] 11/13/2020, 2:52 PM

## 2020-11-14 LAB — CULTURE, BLOOD (ROUTINE X 2)
Culture: NO GROWTH
Special Requests: ADEQUATE

## 2020-11-16 ENCOUNTER — Telehealth: Payer: Self-pay | Admitting: *Deleted

## 2020-11-16 NOTE — Telephone Encounter (Signed)
Called to ask when his next appointment and chemo is scheduled? Informed him of appointments this week. Appointment for 3/8 was moved to this week per Dr. Benay Spice request. Did not want him to wait another week for chemo.

## 2020-11-17 ENCOUNTER — Inpatient Hospital Stay: Payer: Medicare Other | Attending: Oncology

## 2020-11-17 ENCOUNTER — Other Ambulatory Visit: Payer: Self-pay

## 2020-11-17 ENCOUNTER — Inpatient Hospital Stay (HOSPITAL_BASED_OUTPATIENT_CLINIC_OR_DEPARTMENT_OTHER): Payer: Medicare Other | Admitting: Oncology

## 2020-11-17 ENCOUNTER — Inpatient Hospital Stay: Payer: Medicare Other

## 2020-11-17 VITALS — BP 140/78 | HR 90 | Temp 97.8°F | Resp 18 | Ht 69.0 in | Wt 186.9 lb

## 2020-11-17 DIAGNOSIS — I10 Essential (primary) hypertension: Secondary | ICD-10-CM | POA: Diagnosis not present

## 2020-11-17 DIAGNOSIS — Z86010 Personal history of colonic polyps: Secondary | ICD-10-CM | POA: Insufficient documentation

## 2020-11-17 DIAGNOSIS — E876 Hypokalemia: Secondary | ICD-10-CM | POA: Insufficient documentation

## 2020-11-17 DIAGNOSIS — Z87442 Personal history of urinary calculi: Secondary | ICD-10-CM | POA: Diagnosis not present

## 2020-11-17 DIAGNOSIS — Z5111 Encounter for antineoplastic chemotherapy: Secondary | ICD-10-CM | POA: Insufficient documentation

## 2020-11-17 DIAGNOSIS — C20 Malignant neoplasm of rectum: Secondary | ICD-10-CM | POA: Insufficient documentation

## 2020-11-17 DIAGNOSIS — N289 Disorder of kidney and ureter, unspecified: Secondary | ICD-10-CM | POA: Insufficient documentation

## 2020-11-17 DIAGNOSIS — G4730 Sleep apnea, unspecified: Secondary | ICD-10-CM | POA: Insufficient documentation

## 2020-11-17 DIAGNOSIS — Z5189 Encounter for other specified aftercare: Secondary | ICD-10-CM | POA: Diagnosis not present

## 2020-11-17 DIAGNOSIS — Z8547 Personal history of malignant neoplasm of testis: Secondary | ICD-10-CM | POA: Insufficient documentation

## 2020-11-17 LAB — CEA (IN HOUSE-CHCC): CEA (CHCC-In House): 5.53 ng/mL — ABNORMAL HIGH (ref 0.00–5.00)

## 2020-11-17 LAB — CBC WITH DIFFERENTIAL (CANCER CENTER ONLY)
Abs Immature Granulocytes: 0.65 10*3/uL — ABNORMAL HIGH (ref 0.00–0.07)
Basophils Absolute: 0.1 10*3/uL (ref 0.0–0.1)
Basophils Relative: 1 %
Eosinophils Absolute: 0.2 10*3/uL (ref 0.0–0.5)
Eosinophils Relative: 1 %
HCT: 31.5 % — ABNORMAL LOW (ref 39.0–52.0)
Hemoglobin: 10.1 g/dL — ABNORMAL LOW (ref 13.0–17.0)
Immature Granulocytes: 5 %
Lymphocytes Relative: 17 %
Lymphs Abs: 2.4 10*3/uL (ref 0.7–4.0)
MCH: 27.4 pg (ref 26.0–34.0)
MCHC: 32.1 g/dL (ref 30.0–36.0)
MCV: 85.4 fL (ref 80.0–100.0)
Monocytes Absolute: 2.3 10*3/uL — ABNORMAL HIGH (ref 0.1–1.0)
Monocytes Relative: 16 %
Neutro Abs: 8.8 10*3/uL — ABNORMAL HIGH (ref 1.7–7.7)
Neutrophils Relative %: 60 %
Platelet Count: 374 10*3/uL (ref 150–400)
RBC: 3.69 MIL/uL — ABNORMAL LOW (ref 4.22–5.81)
RDW: 20.2 % — ABNORMAL HIGH (ref 11.5–15.5)
WBC Count: 14.4 10*3/uL — ABNORMAL HIGH (ref 4.0–10.5)
nRBC: 0 % (ref 0.0–0.2)

## 2020-11-17 LAB — CMP (CANCER CENTER ONLY)
ALT: 94 U/L — ABNORMAL HIGH (ref 0–44)
AST: 49 U/L — ABNORMAL HIGH (ref 15–41)
Albumin: 3 g/dL — ABNORMAL LOW (ref 3.5–5.0)
Alkaline Phosphatase: 107 U/L (ref 38–126)
Anion gap: 11 (ref 5–15)
BUN: 21 mg/dL (ref 8–23)
CO2: 24 mmol/L (ref 22–32)
Calcium: 9 mg/dL (ref 8.9–10.3)
Chloride: 105 mmol/L (ref 98–111)
Creatinine: 2.19 mg/dL — ABNORMAL HIGH (ref 0.61–1.24)
GFR, Estimated: 32 mL/min — ABNORMAL LOW (ref >60)
Glucose, Bld: 103 mg/dL — ABNORMAL HIGH (ref 70–99)
Potassium: 4.5 mmol/L (ref 3.5–5.1)
Sodium: 140 mmol/L (ref 135–145)
Total Bilirubin: 0.3 mg/dL (ref 0.3–1.2)
Total Protein: 6.8 g/dL (ref 6.5–8.1)

## 2020-11-17 LAB — CULTURE, BLOOD (ROUTINE X 2)
Culture: NO GROWTH
Culture: NO GROWTH
Special Requests: ADEQUATE
Special Requests: ADEQUATE

## 2020-11-17 MED ORDER — FLUCONAZOLE 100 MG PO TABS: 100.0000 mg | ORAL_TABLET | Freq: Every day | ORAL | 0 refills | Status: AC

## 2020-11-17 NOTE — Progress Notes (Signed)
  [facility] OFFICE PROGRESS NOTE   Diagnosis: Rectal cancer  INTERVAL HISTORY:   Walter [name] was discharged in the hospital on 11/13/2020 after admission with an E. coli UTI and bacteremia. He reports feeling better. He completed an outpatient course of oral antibiotics. No neuropathy symptoms. No difficulty with bowel function.  Objective:  Vital signs in last 24 hours:  Blood pressure 140/78, pulse 90, temperature 97.8 F (36.6 C), temperature source Tympanic, resp. rate 18, height 5\' 9"  (1.753 m), weight 186 lb 14.4 oz (84.8 kg), SpO2 100 %.    HEENT: No thrush or ulcers Resp: Lungs clear bilaterally Cardio: Regular rate and rhythm GI: Nontender, no hepatosplenomegaly Vascular: No leg edema    Portacath/PICC-without erythema  Lab Results:  Lab Results  Component Value Date   WBC 14.4 (H) 11/17/2020   HGB 10.1 (L) 11/17/2020   HCT 31.5 (L) 11/17/2020   MCV 85.4 11/17/2020   PLT 374 11/17/2020   NEUTROABS 8.8 (H) 11/17/2020    CMP  Lab Results  Component Value Date   NA 140 11/17/2020   K 4.5 11/17/2020   CL 105 11/17/2020   CO2 24 11/17/2020   GLUCOSE 103 (H) 11/17/2020   BUN 21 11/17/2020   CREATININE 2.19 (H) 11/17/2020   CALCIUM 9.0 11/17/2020   PROT 6.8 11/17/2020   ALBUMIN 3.0 (L) 11/17/2020   AST 49 (H) 11/17/2020   ALT 94 (H) 11/17/2020   ALKPHOS 107 11/17/2020   BILITOT 0.3 11/17/2020   GFRNONAA 32 (L) 11/17/2020    Lab Results  Component Value Date   CEA1 11.24 (H) 09/15/2020    Medications: I have reviewed the patient's current medications.   Assessment/Plan: 1. Rectal cancer ? Rectal nodularity noted on colonoscopy 06/29/2020, removal of a rectal "polyp"revealed a tubulovillous adenoma with high-grade dysplasia ? Physical exam 08/04/2020-perianal mass invading the anal canal and sphincter complex-biopsy positive for mucinous adenocarcinoma ? CTs 08/17/2020-no rectal mass identified, multiple liver cyst and too small to  characterize low-attenuation liver lesions, midline ventral hernia containing proximal right colon, groundglass left upper lobe nodule and nonspecific 3 mm right middle lobe nodule ? Cycle 1 FOLFOX 09/15/2020 ? [facility] pelvis at [facility] 09/17/2020- anal and low rectal mass.  Internal and external sphincters involved by the mass.  Subtle extension of the tumor through the serosa of the low rectum.  No local regional adenopathy. ? MRI liver at [facility] 09/17/2020- scattered hepatic cysts.  No evidence of metastatic disease. ? Cycle 2 FOLFOX 09/29/2020 ? Cycle 3 FOLFOX 10/13/2020 ? Cycle 4 FOLFOX 10/27/2020-Udenyca ? Cycle 5 FOLFOX 11/19/2020-Udenyca 2. Renal insufficiency 3. 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 Hypertension 5. History of kidney stones 6. Sleep apnea 7. Umbilical hernia repair 8. 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 Hospital admission 11/08/2020-E. coli UTI/bacteremia     Disposition: Walter [name] has recovered from the hospital admission with an E. coli urinary tract infection. He has completed 4 cycles of neoadjuvant chemotherapy. He will complete cycle 5 beginning 11/19/2020. He will return for an office visit and cycle 6 chemotherapy on 12/03/2020  Betsy Coder, MD  11/17/2020  8:56 AM

## 2020-11-18 ENCOUNTER — Telehealth: Payer: Self-pay | Admitting: Oncology

## 2020-11-18 ENCOUNTER — Other Ambulatory Visit: Payer: Self-pay | Admitting: Oncology

## 2020-11-18 DIAGNOSIS — C20 Malignant neoplasm of rectum: Secondary | ICD-10-CM

## 2020-11-18 NOTE — Telephone Encounter (Signed)
Scheduled appointments per 3/1 los. Spoke to patient who is aware of appointments dates and times.

## 2020-11-19 ENCOUNTER — Inpatient Hospital Stay: Payer: Medicare Other

## 2020-11-19 ENCOUNTER — Other Ambulatory Visit: Payer: Self-pay

## 2020-11-19 VITALS — BP 147/68 | HR 104 | Temp 98.3°F | Resp 18

## 2020-11-19 DIAGNOSIS — Z5111 Encounter for antineoplastic chemotherapy: Secondary | ICD-10-CM | POA: Diagnosis not present

## 2020-11-19 DIAGNOSIS — C20 Malignant neoplasm of rectum: Secondary | ICD-10-CM

## 2020-11-19 MED ORDER — SODIUM CHLORIDE 0.9 % IV SOLN
2350.0000 mg/m2 | INTRAVENOUS | Status: DC
  Administered 2020-11-19: 5000 mg via INTRAVENOUS
  Filled 2020-11-19: qty 100

## 2020-11-19 MED ORDER — OXALIPLATIN CHEMO INJECTION 100 MG/20ML
65.0000 mg/m2 | Freq: Once | INTRAVENOUS | Status: AC
  Administered 2020-11-19: 140 mg via INTRAVENOUS
  Filled 2020-11-19: qty 28

## 2020-11-19 MED ORDER — SODIUM CHLORIDE 0.9 % IV SOLN
10.0000 mg | Freq: Once | INTRAVENOUS | Status: AC
  Administered 2020-11-19: 10 mg via INTRAVENOUS
  Filled 2020-11-19: qty 1
  Filled 2020-11-19: qty 10

## 2020-11-19 MED ORDER — SODIUM CHLORIDE 0.9% FLUSH
10.0000 mL | INTRAVENOUS | Status: DC | PRN
  Filled 2020-11-19: qty 10

## 2020-11-19 MED ORDER — PALONOSETRON HCL INJECTION 0.25 MG/5ML
0.2500 mg | Freq: Once | INTRAVENOUS | Status: AC
  Administered 2020-11-19: 0.25 mg via INTRAVENOUS

## 2020-11-19 MED ORDER — PALONOSETRON HCL INJECTION 0.25 MG/5ML
INTRAVENOUS | Status: AC
  Filled 2020-11-19: qty 5

## 2020-11-19 MED ORDER — DEXTROSE 5 % IV SOLN
Freq: Once | INTRAVENOUS | Status: AC
  Filled 2020-11-19: qty 250

## 2020-11-19 MED ORDER — LEUCOVORIN CALCIUM INJECTION 350 MG
400.0000 mg/m2 | Freq: Once | INTRAVENOUS | Status: AC
  Administered 2020-11-19: 848 mg via INTRAVENOUS
  Filled 2020-11-19: qty 42.4

## 2020-11-19 MED ORDER — FLUOROURACIL CHEMO INJECTION 2.5 GM/50ML
400.0000 mg/m2 | Freq: Once | INTRAVENOUS | Status: AC
  Administered 2020-11-19: 850 mg via INTRAVENOUS
  Filled 2020-11-19: qty 17

## 2020-11-19 MED ORDER — HEPARIN SOD (PORK) LOCK FLUSH 100 UNIT/ML IV SOLN
500.0000 [IU] | Freq: Once | INTRAVENOUS | Status: DC | PRN
  Filled 2020-11-19: qty 5

## 2020-11-19 NOTE — Patient Instructions (Signed)
[facility] Discharge Instructions for Patients Receiving Chemotherapy  Today you received the following chemotherapy agents oxaliplatin, 39fu  To help prevent nausea and vomiting after your treatment, we encourage you to take your nausea medication as directed.   If you develop nausea and vomiting that is not controlled by your nausea medication, call the clinic.   BELOW ARE SYMPTOMS THAT SHOULD BE REPORTED IMMEDIATELY:  *FEVER GREATER THAN 100.5 F  *CHILLS WITH OR WITHOUT FEVER  NAUSEA AND VOMITING THAT IS NOT CONTROLLED WITH YOUR NAUSEA MEDICATION  *UNUSUAL SHORTNESS OF BREATH  *UNUSUAL BRUISING OR BLEEDING  TENDERNESS IN MOUTH AND THROAT WITH OR WITHOUT PRESENCE OF ULCERS  *URINARY PROBLEMS  *BOWEL PROBLEMS  UNUSUAL RASH Items with * indicate a potential emergency and should be followed up as soon as possible.  Feel free to call the clinic should you have any questions or concerns. The clinic phone number is [phone].  Please show the [facility] at check-in to the Emergency Department and triage nurse.

## 2020-11-19 NOTE — Progress Notes (Signed)
Okay to treat patient per Dr. Benay Spice  R/t Cr of 2.19, ALT of 94, and heart rate of 104.

## 2020-11-21 ENCOUNTER — Other Ambulatory Visit: Payer: Self-pay

## 2020-11-21 ENCOUNTER — Inpatient Hospital Stay: Payer: Medicare Other

## 2020-11-21 VITALS — BP 143/66 | HR 102 | Temp 97.6°F | Resp 17

## 2020-11-21 DIAGNOSIS — Z5111 Encounter for antineoplastic chemotherapy: Secondary | ICD-10-CM | POA: Diagnosis not present

## 2020-11-21 DIAGNOSIS — C20 Malignant neoplasm of rectum: Secondary | ICD-10-CM

## 2020-11-21 MED ORDER — HEPARIN SOD (PORK) LOCK FLUSH 100 UNIT/ML IV SOLN
500.0000 [IU] | Freq: Once | INTRAVENOUS | Status: AC | PRN
  Administered 2020-11-21: 500 [IU]
  Filled 2020-11-21: qty 5

## 2020-11-21 MED ORDER — PEGFILGRASTIM-CBQV 6 MG/0.6ML ~~LOC~~ SOSY
6.0000 mg | PREFILLED_SYRINGE | Freq: Once | SUBCUTANEOUS | Status: AC
  Administered 2020-11-21: 6 mg via SUBCUTANEOUS

## 2020-11-21 MED ORDER — SODIUM CHLORIDE 0.9% FLUSH
10.0000 mL | INTRAVENOUS | Status: DC | PRN
  Administered 2020-11-21: 10 mL
  Filled 2020-11-21: qty 10

## 2020-11-21 NOTE — Patient Instructions (Signed)
Pegfilgrastim injection What is this medicine? PEGFILGRASTIM (PEG fil gra stim) is a long-acting granulocyte colony-stimulating factor that stimulates the growth of neutrophils, a type of white blood cell important in the body's fight against infection. It is used to reduce the incidence of fever and infection in patients with certain types of cancer who are receiving chemotherapy that affects the bone marrow, and to increase survival after being exposed to high doses of radiation. This medicine may be used for other purposes; ask your health care provider or pharmacist if you have questions. COMMON BRAND NAME(S): Fulphila, Neulasta, Nyvepria, UDENYCA, Ziextenzo What should I tell my health care provider before I take this medicine? They need to know if you have any of these conditions:  kidney disease  latex allergy  ongoing radiation therapy  sickle cell disease  skin reactions to acrylic adhesives (On-Body Injector only)  an unusual or allergic reaction to pegfilgrastim, filgrastim, other medicines, foods, dyes, or preservatives  pregnant or trying to get pregnant  breast-feeding How should I use this medicine? This medicine is for injection under the skin. If you get this medicine at home, you will be taught how to prepare and give the pre-filled syringe or how to use the On-body Injector. Refer to the patient Instructions for Use for detailed instructions. Use exactly as directed. Tell your healthcare provider immediately if you suspect that the On-body Injector may not have performed as intended or if you suspect the use of the On-body Injector resulted in a missed or partial dose. It is important that you put your used needles and syringes in a special sharps container. Do not put them in a trash can. If you do not have a sharps container, call your pharmacist or healthcare provider to get one. Talk to your pediatrician regarding the use of this medicine in children. While this drug  may be prescribed for selected conditions, precautions do apply. Overdosage: If you think you have taken too much of this medicine contact a poison control center or emergency room at once. NOTE: This medicine is only for you. Do not share this medicine with others. What if I miss a dose? It is important not to miss your dose. Call your doctor or health care professional if you miss your dose. If you miss a dose due to an On-body Injector failure or leakage, a new dose should be administered as soon as possible using a single prefilled syringe for manual use. What may interact with this medicine? Interactions have not been studied. This list may not describe all possible interactions. Give your health care provider a list of all the medicines, herbs, non-prescription drugs, or dietary supplements you use. Also tell them if you smoke, drink alcohol, or use illegal drugs. Some items may interact with your medicine. What should I watch for while using this medicine? Your condition will be monitored carefully while you are receiving this medicine. You may need blood work done while you are taking this medicine. Talk to your health care provider about your risk of cancer. You may be more at risk for certain types of cancer if you take this medicine. If you are going to need a MRI, CT scan, or other procedure, tell your doctor that you are using this medicine (On-Body Injector only). What side effects may I notice from receiving this medicine? Side effects that you should report to your doctor or health care professional as soon as possible:  allergic reactions (skin rash, itching or hives, swelling of   the face, lips, or tongue)  back pain  dizziness  fever  pain, redness, or irritation at site where injected  pinpoint red spots on the skin  red or dark-brown urine  shortness of breath or breathing problems  stomach or side pain, or pain at the shoulder  swelling  tiredness  trouble  passing urine or change in the amount of urine  unusual bruising or bleeding Side effects that usually do not require medical attention (report to your doctor or health care professional if they continue or are bothersome):  bone pain  muscle pain This list may not describe all possible side effects. Call your doctor for medical advice about side effects. You may report side effects to FDA at 1-800-FDA-1088. Where should I keep my medicine? Keep out of the reach of children. If you are using this medicine at home, you will be instructed on how to store it. Throw away any unused medicine after the expiration date on the label. NOTE: This sheet is a summary. It may not cover all possible information. If you have questions about this medicine, talk to your doctor, pharmacist, or health care provider.  2021 Elsevier/Gold Standard (2019-09-27 13:20:51) Implanted Port Home Guide An implanted port is a device that is placed under the skin. It is usually placed in the chest. The device can be used to give IV medicine, to take blood, or for dialysis. You may have an implanted port if:  You need IV medicine that would be irritating to the small veins in your hands or arms.  You need IV medicines, such as antibiotics, for a long period of time.  You need IV nutrition for a long period of time.  You need dialysis. When you have a port, your health care provider can choose to use the port instead of veins in your arms for these procedures. You may have fewer limitations when using a port than you would if you used other types of long-term IVs, and you will likely be able to return to normal activities after your incision heals. An implanted port has two main parts:  Reservoir. The reservoir is the part where a needle is inserted to give medicines or draw blood. The reservoir is round. After it is placed, it appears as a small, raised area under your skin.  Catheter. The catheter is a thin, flexible  tube that connects the reservoir to a vein. Medicine that is inserted into the reservoir goes into the catheter and then into the vein. How is my port accessed? To access your port:  A numbing cream may be placed on the skin over the port site.  Your health care provider will put on a mask and sterile gloves.  The skin over your port will be cleaned carefully with a germ-killing soap and allowed to dry.  Your health care provider will gently pinch the port and insert a needle into it.  Your health care provider will check for a blood return to make sure the port is in the vein and is not clogged.  If your port needs to remain accessed to get medicine continuously (constant infusion), your health care provider will place a clear bandage (dressing) over the needle site. The dressing and needle will need to be changed every week, or as told by your health care provider. What is flushing? Flushing helps keep the port from getting clogged. Follow instructions from your health care provider about how and when to flush the port. Ports are   usually flushed with saline solution or a medicine called heparin. The need for flushing will depend on how the port is used:  If the port is only used from time to time to give medicines or draw blood, the port may need to be flushed: ? Before and after medicines have been given. ? Before and after blood has been drawn. ? As part of routine maintenance. Flushing may be recommended every 4-6 weeks.  If a constant infusion is running, the port may not need to be flushed.  Throw away any syringes in a disposal container that is meant for sharp items (sharps container). You can buy a sharps container from a pharmacy, or you can make one by using an empty hard plastic bottle with a cover. How long will my port stay implanted? The port can stay in for as long as your health care provider thinks it is needed. When it is time for the port to come out, a surgery will be  done to remove it. The surgery will be similar to the procedure that was done to put the port in. Follow these instructions at home:  Flush your port as told by your health care provider.  If you need an infusion over several days, follow instructions from your health care provider about how to take care of your port site. Make sure you: ? Wash your hands with soap and water before you change your dressing. If soap and water are not available, use alcohol-based hand sanitizer. ? Change your dressing as told by your health care provider. ? Place any used dressings or infusion bags into a plastic bag. Throw that bag in the trash. ? Keep the dressing that covers the needle clean and dry. Do not get it wet. ? Do not use scissors or sharp objects near the tube. ? Keep the tube clamped, unless it is being used.  Check your port site every day for signs of infection. Check for: ? Redness, swelling, or pain. ? Fluid or blood. ? Pus or a bad smell.  Protect the skin around the port site. ? Avoid wearing bra straps that rub or irritate the site. ? Protect the skin around your port from seat belts. Place a soft pad over your chest if needed.  Bathe or shower as told by your health care provider. The site may get wet as long as you are not actively receiving an infusion.  Return to your normal activities as told by your health care provider. Ask your health care provider what activities are safe for you.  Carry a medical alert card or wear a medical alert bracelet at all times. This will let health care providers know that you have an implanted port in case of an emergency.   Get help right away if:  You have redness, swelling, or pain at the port site.  You have fluid or blood coming from your port site.  You have pus or a bad smell coming from the port site.  You have a fever. Summary  Implanted ports are usually placed in the chest for long-term IV access.  Follow instructions from  your health care provider about flushing the port and changing bandages (dressings).  Take care of the area around your port by avoiding clothing that puts pressure on the area, and by watching for signs of infection.  Protect the skin around your port from seat belts. Place a soft pad over your chest if needed.  Get help right away  if you have a fever or you have redness, swelling, pain, drainage, or a bad smell at the port site. This information is not intended to replace advice given to you by your health care provider. Make sure you discuss any questions you have with your health care provider. Document Revised: 01/20/2020 Document Reviewed: 01/20/2020 Elsevier Patient Education  [place].

## 2020-11-24 ENCOUNTER — Inpatient Hospital Stay: Payer: Medicare Other | Admitting: Nurse Practitioner

## 2020-11-24 ENCOUNTER — Inpatient Hospital Stay: Payer: Medicare Other

## 2020-11-26 ENCOUNTER — Inpatient Hospital Stay: Payer: Medicare Other

## 2020-11-29 ENCOUNTER — Other Ambulatory Visit: Payer: Self-pay | Admitting: Oncology

## 2020-12-02 ENCOUNTER — Inpatient Hospital Stay (HOSPITAL_BASED_OUTPATIENT_CLINIC_OR_DEPARTMENT_OTHER): Payer: Medicare Other | Admitting: Nurse Practitioner

## 2020-12-02 ENCOUNTER — Inpatient Hospital Stay: Payer: Medicare Other

## 2020-12-02 ENCOUNTER — Other Ambulatory Visit: Payer: Self-pay

## 2020-12-02 ENCOUNTER — Encounter: Payer: Self-pay | Admitting: Nurse Practitioner

## 2020-12-02 VITALS — BP 149/75 | HR 96 | Temp 97.6°F | Resp 18 | Ht 69.0 in | Wt 182.9 lb

## 2020-12-02 VITALS — HR 88

## 2020-12-02 DIAGNOSIS — C20 Malignant neoplasm of rectum: Secondary | ICD-10-CM

## 2020-12-02 DIAGNOSIS — Z5111 Encounter for antineoplastic chemotherapy: Secondary | ICD-10-CM | POA: Diagnosis not present

## 2020-12-02 DIAGNOSIS — Z95828 Presence of other vascular implants and grafts: Secondary | ICD-10-CM

## 2020-12-02 LAB — CBC WITH DIFFERENTIAL (CANCER CENTER ONLY)
Abs Immature Granulocytes: 0.45 10*3/uL — ABNORMAL HIGH (ref 0.00–0.07)
Basophils Absolute: 0.1 10*3/uL (ref 0.0–0.1)
Basophils Relative: 1 %
Eosinophils Absolute: 0.2 10*3/uL (ref 0.0–0.5)
Eosinophils Relative: 2 %
HCT: 30.6 % — ABNORMAL LOW (ref 39.0–52.0)
Hemoglobin: 9.9 g/dL — ABNORMAL LOW (ref 13.0–17.0)
Immature Granulocytes: 3 %
Lymphocytes Relative: 18 %
Lymphs Abs: 2.6 10*3/uL (ref 0.7–4.0)
MCH: 28.1 pg (ref 26.0–34.0)
MCHC: 32.4 g/dL (ref 30.0–36.0)
MCV: 86.9 fL (ref 80.0–100.0)
Monocytes Absolute: 1.9 10*3/uL — ABNORMAL HIGH (ref 0.1–1.0)
Monocytes Relative: 13 %
Neutro Abs: 9 10*3/uL — ABNORMAL HIGH (ref 1.7–7.7)
Neutrophils Relative %: 63 %
Platelet Count: 183 10*3/uL (ref 150–400)
RBC: 3.52 MIL/uL — ABNORMAL LOW (ref 4.22–5.81)
RDW: 20.8 % — ABNORMAL HIGH (ref 11.5–15.5)
WBC Count: 14.3 10*3/uL — ABNORMAL HIGH (ref 4.0–10.5)
nRBC: 0.1 % (ref 0.0–0.2)

## 2020-12-02 LAB — CMP (CANCER CENTER ONLY)
ALT: 14 U/L (ref 0–44)
AST: 19 U/L (ref 15–41)
Albumin: 3.3 g/dL — ABNORMAL LOW (ref 3.5–5.0)
Alkaline Phosphatase: 134 U/L — ABNORMAL HIGH (ref 38–126)
Anion gap: 7 (ref 5–15)
BUN: 24 mg/dL — ABNORMAL HIGH (ref 8–23)
CO2: 27 mmol/L (ref 22–32)
Calcium: 9 mg/dL (ref 8.9–10.3)
Chloride: 105 mmol/L (ref 98–111)
Creatinine: 2.33 mg/dL — ABNORMAL HIGH (ref 0.61–1.24)
GFR, Estimated: 29 mL/min — ABNORMAL LOW (ref >60)
Glucose, Bld: 122 mg/dL — ABNORMAL HIGH (ref 70–99)
Potassium: 3.8 mmol/L (ref 3.5–5.1)
Sodium: 139 mmol/L (ref 135–145)
Total Bilirubin: 0.2 mg/dL — ABNORMAL LOW (ref 0.3–1.2)
Total Protein: 7.1 g/dL (ref 6.5–8.1)

## 2020-12-02 MED ORDER — PALONOSETRON HCL INJECTION 0.25 MG/5ML
INTRAVENOUS | Status: AC
  Filled 2020-12-02: qty 5

## 2020-12-02 MED ORDER — LEUCOVORIN CALCIUM INJECTION 350 MG
400.0000 mg/m2 | Freq: Once | INTRAVENOUS | Status: AC
  Administered 2020-12-02: 848 mg via INTRAVENOUS
  Filled 2020-12-02: qty 42.4

## 2020-12-02 MED ORDER — LIDOCAINE-PRILOCAINE 2.5-2.5 % EX CREA: TOPICAL_CREAM | CUTANEOUS | 2 refills | Status: DC

## 2020-12-02 MED ORDER — FLUOROURACIL CHEMO INJECTION 2.5 GM/50ML
400.0000 mg/m2 | Freq: Once | INTRAVENOUS | Status: AC
  Administered 2020-12-02: 850 mg via INTRAVENOUS
  Filled 2020-12-02: qty 17

## 2020-12-02 MED ORDER — PALONOSETRON HCL INJECTION 0.25 MG/5ML
0.2500 mg | Freq: Once | INTRAVENOUS | Status: AC
  Administered 2020-12-02: 0.25 mg via INTRAVENOUS

## 2020-12-02 MED ORDER — SODIUM CHLORIDE 0.9 % IV SOLN
10.0000 mg | Freq: Once | INTRAVENOUS | Status: AC
  Administered 2020-12-02: 10 mg via INTRAVENOUS
  Filled 2020-12-02: qty 10

## 2020-12-02 MED ORDER — SODIUM CHLORIDE 0.9 % IV SOLN
2350.0000 mg/m2 | INTRAVENOUS | Status: DC
  Administered 2020-12-02: 5000 mg via INTRAVENOUS
  Filled 2020-12-02: qty 100

## 2020-12-02 MED ORDER — SODIUM CHLORIDE 0.9% FLUSH
10.0000 mL | Freq: Once | INTRAVENOUS | Status: AC
Start: 2020-12-02 — End: 2020-12-02
  Administered 2020-12-02: 10 mL
  Filled 2020-12-02: qty 10

## 2020-12-02 MED ORDER — OXALIPLATIN CHEMO INJECTION 100 MG/20ML
65.0000 mg/m2 | Freq: Once | INTRAVENOUS | Status: AC
  Administered 2020-12-02: 140 mg via INTRAVENOUS
  Filled 2020-12-02: qty 20

## 2020-12-02 MED ORDER — DEXTROSE 5 % IV SOLN
Freq: Once | INTRAVENOUS | Status: AC
  Filled 2020-12-02: qty 250

## 2020-12-02 NOTE — Patient Instructions (Signed)

## 2020-12-02 NOTE — Patient Instructions (Signed)
[facility] Discharge Instructions for Patients Receiving Chemotherapy  Today you received the following chemotherapy agents Oxaliplatin, Leucovorin and Adrucil   To help prevent nausea and vomiting after your treatment, we encourage you to take your nausea medication as directed.    If you develop nausea and vomiting that is not controlled by your nausea medication, call the clinic.   BELOW ARE SYMPTOMS THAT SHOULD BE REPORTED IMMEDIATELY:  *FEVER GREATER THAN 100.5 F  *CHILLS WITH OR WITHOUT FEVER  NAUSEA AND VOMITING THAT IS NOT CONTROLLED WITH YOUR NAUSEA MEDICATION  *UNUSUAL SHORTNESS OF BREATH  *UNUSUAL BRUISING OR BLEEDING  TENDERNESS IN MOUTH AND THROAT WITH OR WITHOUT PRESENCE OF ULCERS  *URINARY PROBLEMS  *BOWEL PROBLEMS  UNUSUAL RASH Items with * indicate a potential emergency and should be followed up as soon as possible.  Feel free to call the clinic should you have any questions or concerns. The clinic phone number is [phone].  Please show the CHEMO ALERT CARD at check-in to the Emergency Department and triage nurse.   

## 2020-12-02 NOTE — Progress Notes (Signed)
  [name] OFFICE PROGRESS NOTE   Diagnosis:  Rectal cancer  INTERVAL HISTORY:   Walter [name] returns as scheduled.  He completed cycle 5 FOLFOX 11/19/2020.  He denies nausea/vomiting.  No mouth sores.  He did note some "little bumps" at the back of his throat that have resolved, no associated pain.  He denies diarrhea.  No significant issues with cold sensitivity.  No numbness or tingling unrelated to cold exposure.  Objective:  Vital signs in last 24 hours:  Blood pressure (!) 149/75, pulse 96, temperature 97.6 F (36.4 C), temperature source Tympanic, resp. rate 18, height 5\' 9"  (1.753 m), weight 182 lb 14.4 oz (83 kg), SpO2 98 %.    HEENT: No thrush or ulcers. Resp: Lungs clear bilaterally. Cardio: Regular rate and rhythm. GI: Abdomen soft and nontender.  No hepatomegaly. Vascular: No leg edema. Neuro: Vibratory sense intact over the fingertips per tuning fork exam. Skin: Palms with mild hyperpigmentation, no erythema. Port-A-Cath without erythema.  Lab Results:  Lab Results  Component Value Date   WBC 14.3 (H) 12/02/2020   HGB 9.9 (L) 12/02/2020   HCT 30.6 (L) 12/02/2020   MCV 86.9 12/02/2020   PLT 183 12/02/2020   NEUTROABS 9.0 (H) 12/02/2020    Imaging:  No results found.  Medications: I have reviewed the patient's current medications.  Assessment/Plan: 1. Rectal cancer ? Rectal nodularity noted on colonoscopy 06/29/2020, removal of a rectal "polyp"revealed a tubulovillous adenoma with high-grade dysplasia ? Physical exam 08/04/2020-perianal mass invading the anal canal and sphincter complex-biopsy positive for mucinous adenocarcinoma ? CTs 08/17/2020-no rectal mass identified, multiple liver cyst and too small to characterize low-attenuation liver lesions, midline ventral hernia containing proximal right colon, groundglass left upper lobe nodule and nonspecific 3 mm right middle lobe nodule ? Cycle1 FOLFOX 09/15/2020 ? MRI pelvis at [facility]  09/17/2020-anal and low rectal mass. Internal and external sphincters involved by the mass. Subtle extension of the tumor through the serosa of the low rectum. No local regional adenopathy. ? MRI liver at [facility] 09/17/2020-scattered hepatic cysts. No evidence of metastatic disease. ? Cycle 2 FOLFOX 09/29/2020 ? Cycle 3 FOLFOX 10/13/2020 ? Cycle 4 FOLFOX 10/27/2020-Udenyca ? Cycle 5 FOLFOX 11/19/2020-Udenyca ? Cycle 6 FOLFOX 12/02/2020-Udenyca 2. Renal insufficiency 3. 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 Hypertension 5. History of kidney stones 6. Sleep apnea 7. Umbilical hernia repair 8. 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 Hospital admission 11/08/2020-E. coli UTI/bacteremia   Disposition: Walter [name] appears stable.  He has completed 5 cycles of FOLFOX.  He continues to tolerate the chemotherapy well.  Plan to proceed with cycle 6 today as scheduled.  We reviewed the CBC from today.  Counts adequate to proceed with treatment.  He will return for lab, follow-up, cycle 7 FOLFOX in 2 weeks.    Ned Card ANP/GNP-BC   12/02/2020  1:13 PM

## 2020-12-02 NOTE — Progress Notes (Signed)
Ok to proceed with creatinine today per Ned Card, NP

## 2020-12-04 ENCOUNTER — Inpatient Hospital Stay: Payer: Medicare Other

## 2020-12-04 ENCOUNTER — Other Ambulatory Visit: Payer: Self-pay

## 2020-12-04 ENCOUNTER — Telehealth: Payer: Self-pay | Admitting: Nurse Practitioner

## 2020-12-04 VITALS — BP 138/70 | HR 88 | Temp 98.2°F | Resp 18

## 2020-12-04 DIAGNOSIS — Z5111 Encounter for antineoplastic chemotherapy: Secondary | ICD-10-CM | POA: Diagnosis not present

## 2020-12-04 DIAGNOSIS — C20 Malignant neoplasm of rectum: Secondary | ICD-10-CM

## 2020-12-04 MED ORDER — PEGFILGRASTIM-CBQV 6 MG/0.6ML ~~LOC~~ SOSY
6.0000 mg | PREFILLED_SYRINGE | Freq: Once | SUBCUTANEOUS | Status: AC
  Administered 2020-12-04: 6 mg via SUBCUTANEOUS

## 2020-12-04 MED ORDER — HEPARIN SOD (PORK) LOCK FLUSH 100 UNIT/ML IV SOLN
500.0000 [IU] | Freq: Once | INTRAVENOUS | Status: AC | PRN
  Administered 2020-12-04: 500 [IU]
  Filled 2020-12-04: qty 5

## 2020-12-04 MED ORDER — SODIUM CHLORIDE 0.9% FLUSH
10.0000 mL | INTRAVENOUS | Status: DC | PRN
  Administered 2020-12-04: 10 mL
  Filled 2020-12-04: qty 10

## 2020-12-04 MED ORDER — PEGFILGRASTIM-CBQV 6 MG/0.6ML ~~LOC~~ SOSY
PREFILLED_SYRINGE | SUBCUTANEOUS | Status: AC
  Filled 2020-12-04: qty 0.6

## 2020-12-04 NOTE — Telephone Encounter (Signed)
Scheduled appt per 3/16 los - left message for patient with appt date and time

## 2020-12-04 NOTE — Patient Instructions (Signed)
Implanted Port Home Guide An implanted port is a device that is placed under the skin. It is usually placed in the chest. The device can be used to give IV medicine, to take blood, or for dialysis. You may have an implanted port if:  You need IV medicine that would be irritating to the small veins in your hands or arms.  You need IV medicines, such as antibiotics, for a long period of time.  You need IV nutrition for a long period of time.  You need dialysis. When you have a port, your health care provider can choose to use the port instead of veins in your arms for these procedures. You may have fewer limitations when using a port than you would if you used other types of long-term IVs, and you will likely be able to return to normal activities after your incision heals. An implanted port has two main parts:  Reservoir. The reservoir is the part where a needle is inserted to give medicines or draw blood. The reservoir is round. After it is placed, it appears as a small, raised area under your skin.  Catheter. The catheter is a thin, flexible tube that connects the reservoir to a vein. Medicine that is inserted into the reservoir goes into the catheter and then into the vein. How is my port accessed? To access your port:  A numbing cream may be placed on the skin over the port site.  Your health care provider will put on a mask and sterile gloves.  The skin over your port will be cleaned carefully with a germ-killing soap and allowed to dry.  Your health care provider will gently pinch the port and insert a needle into it.  Your health care provider will check for a blood return to make sure the port is in the vein and is not clogged.  If your port needs to remain accessed to get medicine continuously (constant infusion), your health care provider will place a clear bandage (dressing) over the needle site. The dressing and needle will need to be changed every week, or as told by your  health care provider. What is flushing? Flushing helps keep the port from getting clogged. Follow instructions from your health care provider about how and when to flush the port. Ports are usually flushed with saline solution or a medicine called heparin. The need for flushing will depend on how the port is used:  If the port is only used from time to time to give medicines or draw blood, the port may need to be flushed: ? Before and after medicines have been given. ? Before and after blood has been drawn. ? As part of routine maintenance. Flushing may be recommended every 4-6 weeks.  If a constant infusion is running, the port may not need to be flushed.  Throw away any syringes in a disposal container that is meant for sharp items (sharps container). You can buy a sharps container from a pharmacy, or you can make one by using an empty hard plastic bottle with a cover. How long will my port stay implanted? The port can stay in for as long as your health care provider thinks it is needed. When it is time for the port to come out, a surgery will be done to remove it. The surgery will be similar to the procedure that was done to put the port in. Follow these instructions at home:  Flush your port as told by your health care   provider.  If you need an infusion over several days, follow instructions from your health care provider about how to take care of your port site. Make sure you: ? Wash your hands with soap and water before you change your dressing. If soap and water are not available, use alcohol-based hand sanitizer. ? Change your dressing as told by your health care provider. ? Place any used dressings or infusion bags into a plastic bag. Throw that bag in the trash. ? Keep the dressing that covers the needle clean and dry. Do not get it wet. ? Do not use scissors or sharp objects near the tube. ? Keep the tube clamped, unless it is being used.  Check your port site every day for signs  of infection. Check for: ? Redness, swelling, or pain. ? Fluid or blood. ? Pus or a bad smell.  Protect the skin around the port site. ? Avoid wearing bra straps that rub or irritate the site. ? Protect the skin around your port from seat belts. Place a soft pad over your chest if needed.  Bathe or shower as told by your health care provider. The site may get wet as long as you are not actively receiving an infusion.  Return to your normal activities as told by your health care provider. Ask your health care provider what activities are safe for you.  Carry a medical alert card or wear a medical alert bracelet at all times. This will let health care providers know that you have an implanted port in case of an emergency.   Get help right away if:  You have redness, swelling, or pain at the port site.  You have fluid or blood coming from your port site.  You have pus or a bad smell coming from the port site.  You have a fever. Summary  Implanted ports are usually placed in the chest for long-term IV access.  Follow instructions from your health care provider about flushing the port and changing bandages (dressings).  Take care of the area around your port by avoiding clothing that puts pressure on the area, and by watching for signs of infection.  Protect the skin around your port from seat belts. Place a soft pad over your chest if needed.  Get help right away if you have a fever or you have redness, swelling, pain, drainage, or a bad smell at the port site. This information is not intended to replace advice given to you by your health care provider. Make sure you discuss any questions you have with your health care provider. Document Revised: 01/20/2020 Document Reviewed: 01/20/2020 Elsevier Patient Education  2021 [employer]. Pegfilgrastim injection What is this medicine? PEGFILGRASTIM (PEG fil gra stim) is a long-acting granulocyte colony-stimulating factor that stimulates  the growth of neutrophils, a type of white blood cell important in the body's fight against infection. It is used to reduce the incidence of fever and infection in patients with certain types of cancer who are receiving chemotherapy that affects the bone marrow, and to increase survival after being exposed to high doses of radiation. This medicine may be used for other purposes; ask your health care provider or pharmacist if you have questions. COMMON BRAND NAME(S): Rexene Edison, Ziextenzo What should I tell my health care provider before I take this medicine? They need to know if you have any of these conditions:  kidney disease  latex allergy  ongoing radiation therapy  sickle cell disease  skin  reactions to acrylic adhesives (On-Body Injector only)  an unusual or allergic reaction to pegfilgrastim, filgrastim, other medicines, foods, dyes, or preservatives  pregnant or trying to get pregnant  breast-feeding How should I use this medicine? This medicine is for injection under the skin. If you get this medicine at home, you will be taught how to prepare and give the pre-filled syringe or how to use the On-body Injector. Refer to the patient Instructions for Use for detailed instructions. Use exactly as directed. Tell your healthcare provider immediately if you suspect that the On-body Injector may not have performed as intended or if you suspect the use of the On-body Injector resulted in a missed or partial dose. It is important that you put your used needles and syringes in a special sharps container. Do not put them in a trash can. If you do not have a sharps container, call your pharmacist or healthcare provider to get one. Talk to your pediatrician regarding the use of this medicine in children. While this drug may be prescribed for selected conditions, precautions do apply. Overdosage: If you think you have taken too much of this medicine contact a poison  control center or emergency room at once. NOTE: This medicine is only for you. Do not share this medicine with others. What if I miss a dose? It is important not to miss your dose. Call your doctor or health care professional if you miss your dose. If you miss a dose due to an On-body Injector failure or leakage, a new dose should be administered as soon as possible using a single prefilled syringe for manual use. What may interact with this medicine? Interactions have not been studied. This list may not describe all possible interactions. Give your health care provider a list of all the medicines, herbs, non-prescription drugs, or dietary supplements you use. Also tell them if you smoke, drink alcohol, or use illegal drugs. Some items may interact with your medicine. What should I watch for while using this medicine? Your condition will be monitored carefully while you are receiving this medicine. You may need blood work done while you are taking this medicine. Talk to your health care provider about your risk of cancer. You may be more at risk for certain types of cancer if you take this medicine. If you are going to need a MRI, CT scan, or other procedure, tell your doctor that you are using this medicine (On-Body Injector only). What side effects may I notice from receiving this medicine? Side effects that you should report to your doctor or health care professional as soon as possible:  allergic reactions (skin rash, itching or hives, swelling of the face, lips, or tongue)  back pain  dizziness  fever  pain, redness, or irritation at site where injected  pinpoint red spots on the skin  red or dark-brown urine  shortness of breath or breathing problems  stomach or side pain, or pain at the shoulder  swelling  tiredness  trouble passing urine or change in the amount of urine  unusual bruising or bleeding Side effects that usually do not require medical attention (report to  your doctor or health care professional if they continue or are bothersome):  bone pain  muscle pain This list may not describe all possible side effects. Call your doctor for medical advice about side effects. You may report side effects to FDA at 1-800-FDA-1088. Where should I keep my medicine? Keep out of the reach of children. If you  are using this medicine at home, you will be instructed on how to store it. Throw away any unused medicine after the expiration date on the label. NOTE: This sheet is a summary. It may not cover all possible information. If you have questions about this medicine, talk to your doctor, pharmacist, or health care provider.  2021 Elsevier/Gold Standard (2019-09-27 13:20:51)

## 2020-12-11 ENCOUNTER — Other Ambulatory Visit: Payer: Self-pay | Admitting: Oncology

## 2020-12-15 ENCOUNTER — Inpatient Hospital Stay: Payer: Medicare Other

## 2020-12-15 ENCOUNTER — Inpatient Hospital Stay (HOSPITAL_BASED_OUTPATIENT_CLINIC_OR_DEPARTMENT_OTHER): Payer: Medicare Other | Admitting: Nurse Practitioner

## 2020-12-15 ENCOUNTER — Other Ambulatory Visit: Payer: Self-pay

## 2020-12-15 ENCOUNTER — Encounter: Payer: Self-pay | Admitting: Nurse Practitioner

## 2020-12-15 VITALS — BP 150/75 | HR 99 | Temp 97.8°F | Resp 18 | Ht 69.0 in | Wt 183.8 lb

## 2020-12-15 DIAGNOSIS — Z5111 Encounter for antineoplastic chemotherapy: Secondary | ICD-10-CM | POA: Diagnosis not present

## 2020-12-15 DIAGNOSIS — C20 Malignant neoplasm of rectum: Secondary | ICD-10-CM

## 2020-12-15 DIAGNOSIS — Z95828 Presence of other vascular implants and grafts: Secondary | ICD-10-CM

## 2020-12-15 LAB — CBC WITH DIFFERENTIAL (CANCER CENTER ONLY)
Abs Immature Granulocytes: 1.18 10*3/uL — ABNORMAL HIGH (ref 0.00–0.07)
Basophils Absolute: 0.1 10*3/uL (ref 0.0–0.1)
Basophils Relative: 1 %
Eosinophils Absolute: 0.1 10*3/uL (ref 0.0–0.5)
Eosinophils Relative: 1 %
HCT: 29.2 % — ABNORMAL LOW (ref 39.0–52.0)
Hemoglobin: 9.6 g/dL — ABNORMAL LOW (ref 13.0–17.0)
Immature Granulocytes: 8 %
Lymphocytes Relative: 20 %
Lymphs Abs: 3.1 10*3/uL (ref 0.7–4.0)
MCH: 28.7 pg (ref 26.0–34.0)
MCHC: 32.9 g/dL (ref 30.0–36.0)
MCV: 87.4 fL (ref 80.0–100.0)
Monocytes Absolute: 2.3 10*3/uL — ABNORMAL HIGH (ref 0.1–1.0)
Monocytes Relative: 15 %
Neutro Abs: 8.7 10*3/uL — ABNORMAL HIGH (ref 1.7–7.7)
Neutrophils Relative %: 55 %
Platelet Count: 144 10*3/uL — ABNORMAL LOW (ref 150–400)
RBC: 3.34 MIL/uL — ABNORMAL LOW (ref 4.22–5.81)
RDW: 20.5 % — ABNORMAL HIGH (ref 11.5–15.5)
WBC Count: 15.4 10*3/uL — ABNORMAL HIGH (ref 4.0–10.5)
nRBC: 0.5 % — ABNORMAL HIGH (ref 0.0–0.2)

## 2020-12-15 LAB — CMP (CANCER CENTER ONLY)
ALT: 32 U/L (ref 0–44)
AST: 35 U/L (ref 15–41)
Albumin: 3.4 g/dL — ABNORMAL LOW (ref 3.5–5.0)
Alkaline Phosphatase: 130 U/L — ABNORMAL HIGH (ref 38–126)
Anion gap: 13 (ref 5–15)
BUN: 20 mg/dL (ref 8–23)
CO2: 26 mmol/L (ref 22–32)
Calcium: 8.8 mg/dL — ABNORMAL LOW (ref 8.9–10.3)
Chloride: 103 mmol/L (ref 98–111)
Creatinine: 2.31 mg/dL — ABNORMAL HIGH (ref 0.61–1.24)
GFR, Estimated: 30 mL/min — ABNORMAL LOW (ref >60)
Glucose, Bld: 117 mg/dL — ABNORMAL HIGH (ref 70–99)
Potassium: 3.2 mmol/L — ABNORMAL LOW (ref 3.5–5.1)
Sodium: 142 mmol/L (ref 135–145)
Total Bilirubin: 0.3 mg/dL (ref 0.3–1.2)
Total Protein: 6.7 g/dL (ref 6.5–8.1)

## 2020-12-15 MED ORDER — SODIUM CHLORIDE 0.9 % IV SOLN
2350.0000 mg/m2 | INTRAVENOUS | Status: DC
  Administered 2020-12-15: 5000 mg via INTRAVENOUS
  Filled 2020-12-15: qty 100

## 2020-12-15 MED ORDER — FLUOROURACIL CHEMO INJECTION 2.5 GM/50ML
400.0000 mg/m2 | Freq: Once | INTRAVENOUS | Status: AC
  Administered 2020-12-15: 850 mg via INTRAVENOUS
  Filled 2020-12-15: qty 17

## 2020-12-15 MED ORDER — LEUCOVORIN CALCIUM INJECTION 350 MG
400.0000 mg/m2 | Freq: Once | INTRAVENOUS | Status: AC
  Administered 2020-12-15: 848 mg via INTRAVENOUS
  Filled 2020-12-15: qty 42.4

## 2020-12-15 MED ORDER — PROCHLORPERAZINE MALEATE 10 MG PO TABS: 10.0000 mg | ORAL_TABLET | Freq: Four times a day (QID) | ORAL | 1 refills | Status: DC | PRN

## 2020-12-15 MED ORDER — SODIUM CHLORIDE 0.9 % IV SOLN
10.0000 mg | Freq: Once | INTRAVENOUS | Status: AC
  Administered 2020-12-15: 10 mg via INTRAVENOUS
  Filled 2020-12-15: qty 10

## 2020-12-15 MED ORDER — SODIUM CHLORIDE 0.9% FLUSH
10.0000 mL | Freq: Once | INTRAVENOUS | Status: AC
  Administered 2020-12-15: 10 mL
  Filled 2020-12-15: qty 10

## 2020-12-15 MED ORDER — OXALIPLATIN CHEMO INJECTION 100 MG/20ML
65.0000 mg/m2 | Freq: Once | INTRAVENOUS | Status: AC
  Administered 2020-12-15: 140 mg via INTRAVENOUS
  Filled 2020-12-15: qty 20

## 2020-12-15 MED ORDER — PALONOSETRON HCL INJECTION 0.25 MG/5ML
INTRAVENOUS | Status: AC
  Filled 2020-12-15: qty 5

## 2020-12-15 MED ORDER — DEXTROSE 5 % IV SOLN
Freq: Once | INTRAVENOUS | Status: AC
  Filled 2020-12-15: qty 250

## 2020-12-15 MED ORDER — SODIUM CHLORIDE 0.9% FLUSH
10.0000 mL | INTRAVENOUS | Status: DC | PRN
  Filled 2020-12-15: qty 10

## 2020-12-15 MED ORDER — PALONOSETRON HCL INJECTION 0.25 MG/5ML
0.2500 mg | Freq: Once | INTRAVENOUS | Status: AC
  Administered 2020-12-15: 0.25 mg via INTRAVENOUS

## 2020-12-15 NOTE — Patient Instructions (Signed)
[facility] Discharge Instructions for Patients Receiving Chemotherapy  Today you received the following chemotherapy agents Oxaliplatin, Leucovorin and Adrucil   To help prevent nausea and vomiting after your treatment, we encourage you to take your nausea medication as directed.    If you develop nausea and vomiting that is not controlled by your nausea medication, call the clinic.   BELOW ARE SYMPTOMS THAT SHOULD BE REPORTED IMMEDIATELY:  *FEVER GREATER THAN 100.5 F  *CHILLS WITH OR WITHOUT FEVER  NAUSEA AND VOMITING THAT IS NOT CONTROLLED WITH YOUR NAUSEA MEDICATION  *UNUSUAL SHORTNESS OF BREATH  *UNUSUAL BRUISING OR BLEEDING  TENDERNESS IN MOUTH AND THROAT WITH OR WITHOUT PRESENCE OF ULCERS  *URINARY PROBLEMS  *BOWEL PROBLEMS  UNUSUAL RASH Items with * indicate a potential emergency and should be followed up as soon as possible.  Feel free to call the clinic should you have any questions or concerns. The clinic phone number is [phone].  Please show the CHEMO ALERT CARD at check-in to the Emergency Department and triage nurse.   

## 2020-12-15 NOTE — Progress Notes (Signed)
  [facility] OFFICE PROGRESS NOTE   Diagnosis: Rectal cancer  INTERVAL HISTORY:   [name] returns as scheduled.  He completed cycle 6 FOLFOX 12/02/2020.  He denies significant issues with nausea/vomiting.  He had a few mouth sores, "not major".  Able to eat and drink without difficulty.  No diarrhea.  Cold sensitivity tends to last 4 to 5 days.  No persistent neuropathy symptoms.  He notes some fatigue and decrease in appetite.  Objective:  Vital signs in last 24 hours:  Blood pressure (!) 150/75, pulse 99, temperature 97.8 F (36.6 C), temperature source Tympanic, resp. rate 18, height 5\' 9"  (1.753 m), weight 183 lb 12.8 oz (83.4 kg), SpO2 100 %.    HEENT: No thrush or ulcers. Resp: Lungs clear bilaterally. Cardio: Regular rate and rhythm. GI: Abdomen soft and nontender.  No hepatomegaly. Vascular: No leg edema. Neuro: Vibratory sense intact over the fingertips per tuning fork exam. Skin: Palms with skin thickening, mild dryness.  No erythema. Port-A-Cath without erythema.   Lab Results:  Lab Results  Component Value Date   WBC 15.4 (H) 12/15/2020   HGB 9.6 (L) 12/15/2020   HCT 29.2 (L) 12/15/2020   MCV 87.4 12/15/2020   PLT 144 (L) 12/15/2020   NEUTROABS 8.7 (H) 12/15/2020    Imaging:  No results found.  Medications: I have reviewed the patient's current medications.  Assessment/Plan: 1. Rectal cancer ? Rectal nodularity noted on colonoscopy 06/29/2020, removal of a rectal "polyp"revealed a tubulovillous adenoma with high-grade dysplasia ? Physical exam 08/04/2020-perianal mass invading the anal canal and sphincter complex-biopsy positive for mucinous adenocarcinoma ? CTs 08/17/2020-no rectal mass identified, multiple liver cyst and too small to characterize low-attenuation liver lesions, midline ventral hernia containing proximal right colon, groundglass left upper lobe nodule and nonspecific 3 mm right middle lobe nodule ? Cycle1 FOLFOX  09/15/2020 ? MRI pelvis at [facility] 09/17/2020-anal and low rectal mass. Internal and external sphincters involved by the mass. Subtle extension of the tumor through the serosa of the low rectum. No local regional adenopathy. ? [facility] liver at [facility] 09/17/2020-scattered hepatic cysts. No evidence of metastatic disease. ? Cycle 2 FOLFOX 09/29/2020 ? Cycle 3 FOLFOX 10/13/2020 ? Cycle 4 FOLFOX 10/27/2020-Udenyca ? Cycle 5 FOLFOX 11/19/2020-Udenyca ? Cycle 6 FOLFOX 12/02/2020-Udenyca ? Cycle 7 FOLFOX 12/15/2020-Udenyca 2. Renal insufficiency 3. 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 Hypertension 5. History of kidney stones 6. Sleep apnea 7. Umbilical hernia repair 8. 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 Hospital admission 11/08/2020-E. coli UTI/bacteremia   Disposition: [name] appears stable.  He has completed 6 cycles of neoadjuvant FOLFOX.  He continues to tolerate well.  Plan to proceed with cycle 7 today as scheduled.  We reviewed the CBC and chemistry panel from today.  Labs are adequate to proceed as above.  Creatinine is stable.  He has mild hypokalemia.  He has missed a few doses of potassium.  He will resume potassium as prescribed.  He will return for lab, follow-up, cycle 8 FOLFOX in 2 weeks.  Referral placed to schedule follow-up with radiation oncology as he nears completion of neoadjuvant FOLFOX.    Ned Card ANP/GNP-BC   12/15/2020  8:35 AM

## 2020-12-15 NOTE — Progress Notes (Signed)
Lisa's note from 12/15/20:  We reviewed the CBC and chemistry panel from today.  Labs are adequate to proceed as above.  Creatinine is stable

## 2020-12-16 ENCOUNTER — Encounter: Payer: Self-pay | Admitting: Radiation Oncology

## 2020-12-17 ENCOUNTER — Ambulatory Visit
Admission: RE | Admit: 2020-12-17 | Discharge: 2020-12-17 | Disposition: A | Discharge status: Home / Self Care | Payer: Self-pay | Source: Ambulatory Visit | Attending: Radiation Oncology | Admitting: Radiation Oncology

## 2020-12-17 ENCOUNTER — Other Ambulatory Visit: Payer: Self-pay

## 2020-12-17 ENCOUNTER — Inpatient Hospital Stay: Payer: Medicare Other

## 2020-12-17 ENCOUNTER — Ambulatory Visit
Admission: RE | Admit: 2020-12-17 | Discharge: 2020-12-17 | Disposition: A | Discharge status: Home / Self Care | Payer: Medicare Other | Source: Ambulatory Visit | Attending: Radiation Oncology | Admitting: Radiation Oncology

## 2020-12-17 ENCOUNTER — Telehealth: Payer: Self-pay | Admitting: Radiation Oncology

## 2020-12-17 ENCOUNTER — Other Ambulatory Visit: Payer: Self-pay | Admitting: Radiation Oncology

## 2020-12-17 VITALS — BP 159/74 | HR 96

## 2020-12-17 DIAGNOSIS — Z5111 Encounter for antineoplastic chemotherapy: Secondary | ICD-10-CM | POA: Diagnosis not present

## 2020-12-17 DIAGNOSIS — C20 Malignant neoplasm of rectum: Secondary | ICD-10-CM

## 2020-12-17 MED ORDER — HEPARIN SOD (PORK) LOCK FLUSH 100 UNIT/ML IV SOLN
500.0000 [IU] | Freq: Once | INTRAVENOUS | Status: AC | PRN
  Administered 2020-12-17: 500 [IU]
  Filled 2020-12-17: qty 5

## 2020-12-17 MED ORDER — PEGFILGRASTIM-CBQV 6 MG/0.6ML ~~LOC~~ SOSY
6.0000 mg | PREFILLED_SYRINGE | Freq: Once | SUBCUTANEOUS | Status: AC
  Administered 2020-12-17: 6 mg via SUBCUTANEOUS

## 2020-12-17 MED ORDER — SODIUM CHLORIDE 0.9% FLUSH
10.0000 mL | INTRAVENOUS | Status: DC | PRN
  Administered 2020-12-17: 10 mL
  Filled 2020-12-17: qty 10

## 2020-12-17 MED ORDER — PEGFILGRASTIM-CBQV 6 MG/0.6ML ~~LOC~~ SOSY
PREFILLED_SYRINGE | SUBCUTANEOUS | Status: AC
  Filled 2020-12-17: qty 0.6

## 2020-12-17 NOTE — Progress Notes (Signed)
Radiation Oncology         [phone] ________________________________  Outpatient Follow Up - Conducted via telephone due to current COVID-19 concerns for limiting patient exposure  I spoke with the patient to conduct this consult visit via telephone to spare the patient unnecessary potential exposure in the healthcare setting during the current COVID-19 pandemic. The patient was notified in advance and was offered a [place] meeting to allow for face to face communication but unfortunately reported that they did not have the appropriate resources/technology to support such a visit and instead preferred to proceed with a telephone consult.    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   HISTORY OF PRESENT ILLNESS: Walter [name] is a 70 y.o. male with a history of carcinoma of the rectum. The patient was experiencing rectal bleeding intermittently for about a year and met with his provider at [facility] and underwent a colonoscopy on 06/29/2020. The notes indicate that there was a nodularity of the rectum by digital examination as well as multiple polyps, cecal polypectomy showed a benign serrated polyp, tubular adenoma was seen in the ascending colon and descending colon as well as a tubular visible villous adenoma with high-grade dysplasia involving the rectal polypectomy margin involved could not be excluded by rectal polypectomy and he met with Dr. Marcello Moores. By exam a 75% circumferential perianal mass with involvement of the left posterior anal canal was noted and there was fixed appearing changes of the underlying tissue to the mass. 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 He also underwent a CT chest abdomen pelvis on 08/17/2020  which revealed no specific findings to suggest metastatic or nodal disease in the chest abdomen and pelvis, multiple lobe liver cysts, a large abdominal wall hernia containing the proximal right colon, and a 7 mm left upper lobe nodule and right middle lobe nodule measuring 3 mm were all identified along with stigmata of atherosclerotic disease. He underwent attempt at MRI of the pelvis but could not proceed with this due to severe claustrophobia. We were originally going to proceed with chemoRT in December, but after seeing Dr. Benay Spice, his treatment plan changed to total neoadjuvant chemotherapy which began on 09/15/20.  He was able to tolerate an MRI of the pelvis on 09/17/20 that showed involvement of the anal and low rectal tissues involving the internal and external sphincters of the anal canal with extension of the serosa of the low rectum, but no adenopathy was noted. A liver MRI also showed scattered hepatic cysts as well but no metastatic disease. His last infusion of FOLFOX was on 12/15/20, and his last cycle, #8 will take place on 12/29/20. He comes today to revisit chemoRT. 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   PREVIOUS RADIATION THERAPY: Yes   May 1991: It appears from handwritten notes that in [place] he received 27 Gy in 15 fractions to the pelvic and paraaortic lymph nodes.   PAST MEDICAL HISTORY:  Past Medical History:  Diagnosis Date  . Hypertension   . Rectal cancer ([facility]) dx'd 07/2020  . testicular ca    XRT comp       PAST SURGICAL HISTORY: Past Surgical History:  Procedure  Laterality Date  . IR IMAGING GUIDED PORT INSERTION  09/08/2020  . RECTAL BIOPSY  2021     FAMILY HISTORY:  Family History  Problem Relation Age of Onset  . Hypertension Father   . Cancer Brother      SOCIAL HISTORY:  reports that he has never smoked. He has never used smokeless tobacco. He reports previous drug use. He reports  that he does not drink alcohol.The patient is married and lives in [place]. He's originally from [place]. He and his wife moved here to be closer to family. 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   ALLERGIES: Patient has no known allergies.   MEDICATIONS:  Current Outpatient Medications  Medication Sig Dispense Refill  . acetaminophen (TYLENOL) 500 MG tablet Take 500 mg by mouth 2 (two) times daily.    Marland Kitchen amLODipine (NORVASC) 10 MG tablet Take 10 mg by mouth daily.    Marland Kitchen aspirin 81 MG EC tablet Take 81 mg by mouth every Monday.    . chlorthalidone (HYGROTON) 25 MG tablet Take 25 mg by mouth daily.    . Cholecalciferol (D3-1000 PO) Take 1 tablet by mouth daily.    . Cyanocobalamin (VITAMIN B-12) 5000 MCG SUBL Place 5,000 mcg under the tongue 2 (two) times daily.    Marland Kitchen lidocaine-prilocaine (EMLA) cream Apply to port site 1-2 hours prior to use 30 g 2  . Multiple Vitamin (MULTIVITAMIN WITH MINERALS) TABS tablet Take 1 tablet by mouth every Monday.    . potassium chloride SA (KLOR-CON) 20 MEQ tablet Take 1 tablet (20 mEq total) by mouth 2 (two) times daily. 60 tablet 1  . prochlorperazine (COMPAZINE) 10 MG tablet Take 1 tablet (10 mg total) by mouth every 6 (six) hours as needed for nausea. 60 tablet 1   No current facility-administered medications for this encounter.     REVIEW OF SYSTEMS: On review of systems, the patient reports that he is doing well overall. He denies any trouble with his stools at this time, and are more soft than previously. He denies any rectal bleeding and denies any tingling or burning when passing stool. He denies abdominal pain, nausea, vomiting, and reports his only symptom at this time with chemo he's noticing is some intermittent neuropathic pain in his fingertips. No other complaints are verbalized.    PHYSICAL EXAM:  Wt Readings from Last 3 Encounters:  12/15/20 183  lb 12.8 oz (83.4 kg)  12/02/20 182 lb 14.4 oz (83 kg)  11/17/20 186 lb 14.4 oz (84.8 kg)   Unable to assess due to encounter type.    ECOG = 1  0 - Asymptomatic (Fully active, able to carry on all predisease activities without restriction)  1 - Symptomatic but completely ambulatory (Restricted in physically strenuous activity but ambulatory and able to carry out work of a light or sedentary nature. For example, light housework, office work)  2 - Symptomatic, <50% in bed during the day (Ambulatory and capable of all self care but unable to carry out any work activities. Up and about more than 50% of waking hours)  3 - Symptomatic, >50% in bed, but not bedbound (Capable of only limited self-care, confined to bed or chair 50% or more of waking hours)  4 - Bedbound (Completely disabled. Cannot carry on any self-care. Totally confined to bed or chair)  5 - Death   Lolita Rieger, Daniel Nones [facility], [place]  [place], et al. (1982). "Toxicity and response criteria of the [facility]". [place] Oncol. 5 (6): 649-55    LABORATORY DATA:  Lab Results  Component Value Date   WBC 15.4 (H) 12/15/2020   HGB 9.6 (L) 12/15/2020   HCT 29.2 (L) 12/15/2020   MCV 87.4 12/15/2020   PLT 144 (L) 12/15/2020   Lab Results  Component Value Date   NA 142 12/15/2020   K 3.2 (L) 12/15/2020   CL 103 12/15/2020   CO2 26 12/15/2020   Lab Results  Component Value Date   ALT 32 12/15/2020   AST 35 12/15/2020   ALKPHOS 130 (H) 12/15/2020   BILITOT 0.3 12/15/2020      RADIOGRAPHY: No results found.     IMPRESSION/PLAN: 1. 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 Dr. Lisbeth Renshaw discusses the prior imaging and pathology findings and reviews the nature of adenocarcinomas of the lower GI tract and reviews his recent course. He was able to have an MRI of the pelvis and we will ask for his films to be powershared to view and use for fusion for radiotherapy. Dr. Lisbeth Renshaw anticipates  fields similar to anal carcinoma treatment along with chemosensitization. We discussed the risks, benefits, short, and long term effects of radiotherapy, as well as the curative intent, and the patient is interested in proceeding. Dr. Lisbeth Renshaw discusses the delivery and logistics of radiotherapy and anticipates a course of 6 weeks of radiotherapy. He will simulate on 01/04/21 and begin treatment on 02/07/21. We would anticipate his treatment would be complete on 02/26/21. The patient is in agreement with this plan.  Given current concerns for patient exposure during the COVID-19 pandemic, this encounter was conducted via telephone.  The patient has provided two factor identification and has given verbal consent for this type of encounter and has been advised to only accept a meeting of this type in a secure network environment. The time spent during this encounter was 45 minutes including preparation, discussion, and coordination of the patient's care. The attendants for this meeting include Dr. Lisbeth Renshaw, Hayden Pedro  and Remus Blake.  During the encounter, Dr. Lisbeth Renshaw, and Hayden Pedro were located at [facility] Radiation Oncology Department.  Shiraz Bastyr was located at home.   The above documentation reflects my direct findings during this shared patient visit. Please see the separate note by Dr. Lisbeth Renshaw on this date for the remainder of the patient's plan of care.    Carola Rhine, PAC

## 2020-12-17 NOTE — Telephone Encounter (Signed)
The patient called back to say he'd like to start chemoRT on 01/11/21. I've made note of this for our planning appointment and will let Dr. Benay Spice know too since this changed from our prior conversation.

## 2020-12-27 ENCOUNTER — Other Ambulatory Visit: Payer: Self-pay | Admitting: Oncology

## 2020-12-29 ENCOUNTER — Other Ambulatory Visit: Payer: Self-pay

## 2020-12-29 ENCOUNTER — Other Ambulatory Visit (HOSPITAL_COMMUNITY): Payer: Self-pay

## 2020-12-29 ENCOUNTER — Telehealth: Payer: Self-pay | Admitting: Oncology

## 2020-12-29 ENCOUNTER — Inpatient Hospital Stay: Payer: Medicare Other | Attending: Oncology

## 2020-12-29 ENCOUNTER — Other Ambulatory Visit: Payer: Self-pay | Admitting: Nurse Practitioner

## 2020-12-29 ENCOUNTER — Telehealth: Payer: Self-pay | Admitting: Pharmacist

## 2020-12-29 ENCOUNTER — Inpatient Hospital Stay (HOSPITAL_BASED_OUTPATIENT_CLINIC_OR_DEPARTMENT_OTHER): Payer: Medicare Other | Admitting: Oncology

## 2020-12-29 ENCOUNTER — Inpatient Hospital Stay: Payer: Medicare Other

## 2020-12-29 ENCOUNTER — Telehealth: Payer: Self-pay

## 2020-12-29 VITALS — BP 147/73 | HR 105 | Temp 97.8°F | Resp 20 | Ht 69.0 in | Wt 174.6 lb

## 2020-12-29 VITALS — HR 87

## 2020-12-29 DIAGNOSIS — C20 Malignant neoplasm of rectum: Secondary | ICD-10-CM

## 2020-12-29 DIAGNOSIS — R110 Nausea: Secondary | ICD-10-CM | POA: Insufficient documentation

## 2020-12-29 DIAGNOSIS — Z87442 Personal history of urinary calculi: Secondary | ICD-10-CM | POA: Diagnosis not present

## 2020-12-29 DIAGNOSIS — Z5111 Encounter for antineoplastic chemotherapy: Secondary | ICD-10-CM | POA: Diagnosis not present

## 2020-12-29 DIAGNOSIS — K439 Ventral hernia without obstruction or gangrene: Secondary | ICD-10-CM | POA: Insufficient documentation

## 2020-12-29 DIAGNOSIS — Z8547 Personal history of malignant neoplasm of testis: Secondary | ICD-10-CM | POA: Diagnosis not present

## 2020-12-29 DIAGNOSIS — K7689 Other specified diseases of liver: Secondary | ICD-10-CM | POA: Diagnosis not present

## 2020-12-29 DIAGNOSIS — Z95828 Presence of other vascular implants and grafts: Secondary | ICD-10-CM

## 2020-12-29 DIAGNOSIS — G4730 Sleep apnea, unspecified: Secondary | ICD-10-CM | POA: Diagnosis not present

## 2020-12-29 DIAGNOSIS — I10 Essential (primary) hypertension: Secondary | ICD-10-CM | POA: Insufficient documentation

## 2020-12-29 DIAGNOSIS — Z923 Personal history of irradiation: Secondary | ICD-10-CM | POA: Insufficient documentation

## 2020-12-29 DIAGNOSIS — G620 Drug-induced polyneuropathy: Secondary | ICD-10-CM | POA: Insufficient documentation

## 2020-12-29 DIAGNOSIS — R7881 Bacteremia: Secondary | ICD-10-CM | POA: Insufficient documentation

## 2020-12-29 DIAGNOSIS — N289 Disorder of kidney and ureter, unspecified: Secondary | ICD-10-CM | POA: Insufficient documentation

## 2020-12-29 DIAGNOSIS — R197 Diarrhea, unspecified: Secondary | ICD-10-CM | POA: Diagnosis not present

## 2020-12-29 LAB — CMP (CANCER CENTER ONLY)
ALT: 24 U/L (ref 0–44)
AST: 27 U/L (ref 15–41)
Albumin: 3.7 g/dL (ref 3.5–5.0)
Alkaline Phosphatase: 107 U/L (ref 38–126)
Anion gap: 9 (ref 5–15)
BUN: 20 mg/dL (ref 8–23)
CO2: 27 mmol/L (ref 22–32)
Calcium: 9.1 mg/dL (ref 8.9–10.3)
Chloride: 104 mmol/L (ref 98–111)
Creatinine: 2.35 mg/dL — ABNORMAL HIGH (ref 0.61–1.24)
GFR, Estimated: 29 mL/min — ABNORMAL LOW (ref >60)
Glucose, Bld: 119 mg/dL — ABNORMAL HIGH (ref 70–99)
Potassium: 3.4 mmol/L — ABNORMAL LOW (ref 3.5–5.1)
Sodium: 140 mmol/L (ref 135–145)
Total Bilirubin: 0.4 mg/dL (ref 0.3–1.2)
Total Protein: 6.5 g/dL (ref 6.5–8.1)

## 2020-12-29 LAB — CBC WITH DIFFERENTIAL (CANCER CENTER ONLY)
Abs Immature Granulocytes: 0.2 10*3/uL — ABNORMAL HIGH (ref 0.00–0.07)
Basophils Absolute: 0.2 10*3/uL — ABNORMAL HIGH (ref 0.0–0.1)
Basophils Relative: 1 %
Eosinophils Absolute: 0 10*3/uL (ref 0.0–0.5)
Eosinophils Relative: 0 %
HCT: 28 % — ABNORMAL LOW (ref 39.0–52.0)
Hemoglobin: 9 g/dL — ABNORMAL LOW (ref 13.0–17.0)
Lymphocytes Relative: 14 %
Lymphs Abs: 2.9 10*3/uL (ref 0.7–4.0)
MCH: 29.2 pg (ref 26.0–34.0)
MCHC: 32.1 g/dL (ref 30.0–36.0)
MCV: 90.9 fL (ref 80.0–100.0)
Monocytes Absolute: 2.7 10*3/uL — ABNORMAL HIGH (ref 0.1–1.0)
Monocytes Relative: 13 %
Myelocytes: 1 %
Neutro Abs: 14.7 10*3/uL — ABNORMAL HIGH (ref 1.7–7.7)
Neutrophils Relative %: 71 %
Platelet Count: 222 10*3/uL (ref 150–400)
RBC: 3.08 MIL/uL — ABNORMAL LOW (ref 4.22–5.81)
RDW: 22.1 % — ABNORMAL HIGH (ref 11.5–15.5)
WBC Count: 20.7 10*3/uL — ABNORMAL HIGH (ref 4.0–10.5)
nRBC: 0.4 % — ABNORMAL HIGH (ref 0.0–0.2)

## 2020-12-29 MED ORDER — FLUCONAZOLE 100 MG PO TABS: 100.0000 mg | ORAL_TABLET | Freq: Every day | ORAL | 0 refills | Status: DC

## 2020-12-29 MED ORDER — CAPECITABINE 500 MG PO TABS
ORAL_TABLET | ORAL | 0 refills | Status: DC
  Filled 2020-12-29: qty 140, fill #0
  Filled 2021-01-06 – 2021-02-06 (×2): qty 140, 30d supply, fill #0

## 2020-12-29 MED ORDER — SODIUM CHLORIDE 0.9 % IV SOLN
2400.0000 mg/m2 | INTRAVENOUS | Status: DC
  Administered 2020-12-29: 5100 mg via INTRAVENOUS
  Filled 2020-12-29: qty 102

## 2020-12-29 MED ORDER — SODIUM CHLORIDE 0.9% FLUSH
10.0000 mL | Freq: Once | INTRAVENOUS | Status: AC
  Administered 2020-12-29: 10 mL
  Filled 2020-12-29: qty 10

## 2020-12-29 MED ORDER — CAPECITABINE 500 MG PO TABS
ORAL_TABLET | ORAL | 0 refills | Status: DC
  Filled 2020-12-29: qty 140, fill #0

## 2020-12-29 NOTE — Patient Instructions (Signed)
[facility] Discharge Instructions for Patients Receiving Chemotherapy  Today you received the following chemotherapy agents Flourouracil (ADRUCIL) pump.  To help prevent nausea and vomiting after your treatment, we encourage you to take your nausea medication as prescribed.   If you develop nausea and vomiting that is not controlled by your nausea medication, call the clinic.   BELOW ARE SYMPTOMS THAT SHOULD BE REPORTED IMMEDIATELY:  *FEVER GREATER THAN 100.5 F  *CHILLS WITH OR WITHOUT FEVER  NAUSEA AND VOMITING THAT IS NOT CONTROLLED WITH YOUR NAUSEA MEDICATION  *UNUSUAL SHORTNESS OF BREATH  *UNUSUAL BRUISING OR BLEEDING  TENDERNESS IN MOUTH AND THROAT WITH OR WITHOUT PRESENCE OF ULCERS  *URINARY PROBLEMS  *BOWEL PROBLEMS  UNUSUAL RASH Items with * indicate a potential emergency and should be followed up as soon as possible.  Feel free to call the clinic should you have any questions or concerns at The clinic phone number is [phone].  Please show the [place] at check-in to the Emergency Department and triage nurse.

## 2020-12-29 NOTE — Telephone Encounter (Signed)
Oral Oncology Pharmacist Encounter  Received new prescription for Xeloda (capecitabine) for the treatment of rectal cancer in conjunction with radiation, planned duration 5 1/2 weeks.  Prescription dose and frequency assessed for appropriateness. CBC w/ Diff and CMP from 12/29/20 assessed, pt with baseline renal dysfunction Scr 2.35, CrCl ~32.7 mL/min. MD aware of baseline renal dysfunction and borderline use for capecitabine - dose is reduced 25% from typical starting dose.   Current medication list in Epic reviewed, no relevant/significant DDIs with Xeloda identified.  Evaluated chart and no patient barriers to medication adherence noted.   Prescription has been e-scribed to the [facility] for benefits analysis and approval.  Oral Oncology Clinic will continue to follow for insurance authorization, copayment issues, initial counseling and start date.  Leron Croak, PharmD, BCPS Hematology/Oncology Clinical Pharmacist [facility] [phone] 12/29/2020 1:26 PM

## 2020-12-29 NOTE — Patient Instructions (Signed)

## 2020-12-29 NOTE — Progress Notes (Signed)
Per Lattie Haw, NP: okay to proceed with Scr of 2.35

## 2020-12-29 NOTE — Telephone Encounter (Signed)
Scheduled appt per 4/12 los - pt to get an updated schedule in infusion

## 2020-12-29 NOTE — Progress Notes (Signed)
[place] OFFICE PROGRESS NOTE   Diagnosis: Rectal cancer  INTERVAL HISTORY:   Mr. Betke completed another cycle of FOLFOX on 12/15/2020.  No nausea/vomiting.  He has developed diarrhea over the past week.  He has 2-3 loose stools per day.  He attributes this to using Ensure as a nutrition supplement.  He has developed anorexia.  No rectal bleeding or pain.  Mr. Litsey reports tingling and mild numbness in the fingers.  He has difficulty opening jars.  Objective:  Vital signs in last 24 hours:  Blood pressure (!) 147/73, pulse (!) 105, temperature 97.8 F (36.6 C), temperature source Oral, resp. rate 20, height 5\' 9"  (1.753 m), weight 174 lb 9.6 oz (79.2 kg), SpO2 100 %.    HEENT: No buccal thrush or ulcers, thick white coat over the tongue Resp: Lungs clear bilaterally Cardio: Regular rate and rhythm GI: No hepatosplenomegaly, nontender Vascular: No leg edema Neuro: Mild loss of vibratory sense at the fingertips bilaterally Skin: Hyperpigmentation of the hands  Portacath/PICC-without erythema  Lab Results:  Lab Results  Component Value Date   WBC 20.7 (H) 12/29/2020   HGB 9.0 (L) 12/29/2020   HCT 28.0 (L) 12/29/2020   MCV 90.9 12/29/2020   PLT 222 12/29/2020   NEUTROABS 14.7 (H) 12/29/2020    CMP  Lab Results  Component Value Date   NA 140 12/29/2020   K 3.4 (L) 12/29/2020   CL 104 12/29/2020   CO2 27 12/29/2020   GLUCOSE 119 (H) 12/29/2020   BUN 20 12/29/2020   CREATININE 2.35 (H) 12/29/2020   CALCIUM 9.1 12/29/2020   PROT 6.5 12/29/2020   ALBUMIN 3.7 12/29/2020   AST 27 12/29/2020   ALT 24 12/29/2020   ALKPHOS 107 12/29/2020   BILITOT 0.4 12/29/2020   GFRNONAA 29 (L) 12/29/2020    Lab Results  Component Value Date   CEA1 5.53 (H) 11/17/2020     Medications: I have reviewed the patient's current medications.   Assessment/Plan: 1. Rectal cancer ? Rectal nodularity noted on colonoscopy 06/29/2020, removal of a rectal  "polyp"revealed a tubulovillous adenoma with high-grade dysplasia ? Physical exam 08/04/2020-perianal mass invading the anal canal and sphincter complex-biopsy positive for mucinous adenocarcinoma ? CTs 08/17/2020-no rectal mass identified, multiple liver cyst and too small to characterize low-attenuation liver lesions, midline ventral hernia containing proximal right colon, groundglass left upper lobe nodule and nonspecific 3 mm right middle lobe nodule ? Cycle1 FOLFOX 09/15/2020 ? MRI pelvis at [facility] 09/17/2020-anal and low rectal mass. Internal and external sphincters involved by the mass. Subtle extension of the tumor through the serosa of the low rectum. No local regional adenopathy. ? MRI liver at [facility] 09/17/2020-scattered hepatic cysts. No evidence of metastatic disease. ? Cycle 2 FOLFOX 09/29/2020 ? Cycle 3 FOLFOX 10/13/2020 ? Cycle 4 FOLFOX 10/27/2020-Udenyca ? Cycle 5 FOLFOX 11/19/2020-Udenyca ? Cycle 6 FOLFOX 12/02/2020-Udenyca ? Cycle 7 FOLFOX 12/15/2020-Udenyca ? Cycle 8 FOLFOX 12/29/2020 -oxaliplatin, 5-FU bolus, and Udenyca held 2. Renal insufficiency 3. 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 Hypertension 5. History of kidney stones 6. Sleep apnea 7. Umbilical hernia repair 8. 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 Hospital admission 11/08/2020-E. 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 Odowd has completed 7 cycles of FOLFOX.  He had  anorexia, diarrhea, and persistent peripheral tingling following the most recent cycle of chemotherapy.  He will complete cycle 8 of neoadjuvant therapy today.  Oxaliplatin and the 5-FU bolus will be held.  The plan is to begin concurrent chemotherapy and  radiation on 01/11/2021.  We reviewed potential toxicities associated with capecitabine including the chance of mucositis, diarrhea, and hand/foot syndrome.  He has borderline renal function to receive capecitabine.  We will follow his clinical status closely.  Mr. Schwinn will call for increased diarrhea.  He will begin Imodium.  He will complete a course of Diflucan for oral candidiasis.  He will return for an office visit prior to beginning capecitabine and radiation on 01/11/2021.  Betsy Coder, MD  12/29/2020  12:52 PM

## 2020-12-29 NOTE — Telephone Encounter (Signed)
Oral Oncology Patient Advocate Encounter  After completing a benefits investigation, prior authorization for Xeloda is not required at this time through [facility] [place] Phone [phone] Fax [phone] 12/29/2020 1:43 PM

## 2020-12-31 ENCOUNTER — Inpatient Hospital Stay: Payer: Medicare Other

## 2020-12-31 ENCOUNTER — Other Ambulatory Visit: Payer: Self-pay

## 2020-12-31 VITALS — BP 143/68 | HR 84 | Temp 98.0°F | Resp 19

## 2020-12-31 DIAGNOSIS — Z5111 Encounter for antineoplastic chemotherapy: Secondary | ICD-10-CM | POA: Diagnosis not present

## 2020-12-31 DIAGNOSIS — C20 Malignant neoplasm of rectum: Secondary | ICD-10-CM

## 2020-12-31 MED ORDER — SODIUM CHLORIDE 0.9% FLUSH
10.0000 mL | INTRAVENOUS | Status: AC | PRN
  Administered 2020-12-31: 10 mL
  Filled 2020-12-31: qty 10

## 2020-12-31 MED ORDER — HEPARIN SOD (PORK) LOCK FLUSH 100 UNIT/ML IV SOLN
500.0000 [IU] | Freq: Once | INTRAVENOUS | Status: AC | PRN
  Administered 2020-12-31: 500 [IU]
  Filled 2020-12-31: qty 5

## 2020-12-31 NOTE — Patient Instructions (Signed)

## 2020-12-31 NOTE — Progress Notes (Signed)
Shaft Corigliano presents today for 5FU pump discontinuation. Pump discontinued and port flushed with NS and heparin per protocol. Port deaccessed and bandaid applied. VSS. Discharged in satisfactory condition with follow up instructions.

## 2020-12-31 NOTE — Addendum Note (Signed)
Addended by: Laqueta Linden on: 12/31/2020 11:27 AM   Modules accepted: Orders

## 2021-01-01 ENCOUNTER — Telehealth: Payer: Self-pay | Admitting: *Deleted

## 2021-01-01 ENCOUNTER — Other Ambulatory Visit (HOSPITAL_COMMUNITY): Payer: Self-pay

## 2021-01-01 MED ORDER — MAGIC MOUTHWASH: 10.0000 mL | Freq: Four times a day (QID) | ORAL | 0 refills | Status: DC | PRN

## 2021-01-01 MED ORDER — NYSTATIN 100000 UNIT/ML MT SUSP
OROMUCOSAL | 0 refills | Status: DC
  Filled 2021-01-01: qty 240, 6d supply, fill #0

## 2021-01-01 NOTE — Telephone Encounter (Signed)
Called to report his throat pain has gotten worse over the past week to point he is now having trouble swallowing soft/solid foods. Is on day 3/4 of fluconazole MD ordered on 4/12. Taking Tylenol 1000 mg bid for pain. Is very uncomfortable and asking if there is something else he can try?

## 2021-01-01 NOTE — Telephone Encounter (Signed)
Per Ned Card, NP: Try MMW swish and swallow qid prn. Call for fever or gets worse. May want to consider covid test if it does not improve over the weekend. [facility] does not compound the MMW. Script called in to [facility]. Patient notified.

## 2021-01-04 ENCOUNTER — Other Ambulatory Visit: Payer: Self-pay | Admitting: *Deleted

## 2021-01-04 ENCOUNTER — Ambulatory Visit
Admission: RE | Admit: 2021-01-04 | Discharge: 2021-01-04 | Disposition: A | Discharge status: Home / Self Care | Payer: Medicare Other | Source: Ambulatory Visit | Attending: Radiation Oncology | Admitting: Radiation Oncology

## 2021-01-04 ENCOUNTER — Other Ambulatory Visit: Payer: Self-pay

## 2021-01-04 DIAGNOSIS — C20 Malignant neoplasm of rectum: Secondary | ICD-10-CM | POA: Insufficient documentation

## 2021-01-04 DIAGNOSIS — Z510 Encounter for antineoplastic radiation therapy: Secondary | ICD-10-CM | POA: Insufficient documentation

## 2021-01-04 MED ORDER — POTASSIUM CHLORIDE CRYS ER 20 MEQ PO TBCR: 20.0000 meq | EXTENDED_RELEASE_TABLET | Freq: Two times a day (BID) | ORAL | 0 refills | Status: DC

## 2021-01-04 NOTE — Telephone Encounter (Signed)
Received faxed refill request for Potassium Chloride for 90 day supply to [facility]. This was approved.

## 2021-01-06 ENCOUNTER — Other Ambulatory Visit: Payer: Self-pay | Admitting: Oncology

## 2021-01-06 ENCOUNTER — Other Ambulatory Visit (HOSPITAL_COMMUNITY): Payer: Self-pay

## 2021-01-06 DIAGNOSIS — C20 Malignant neoplasm of rectum: Secondary | ICD-10-CM

## 2021-01-07 ENCOUNTER — Other Ambulatory Visit (HOSPITAL_COMMUNITY): Payer: Self-pay

## 2021-01-07 NOTE — Telephone Encounter (Signed)
Oral Chemotherapy Pharmacist Encounter  I spoke with patient for overview of: Xeloda for the treatment of rectal cancer in conjunction with radiation, planned duration 5 1/2 - 6 weeks.    Counseled patient on administration, dosing, side effects, monitoring, drug-food interactions, safe handling, storage, and disposal.  Patient will take Xeloda 500mg  tablets, 3 tablets (1500mg ) by mouth in AM and 2 tabs (1000mg ) by mouth in PM, within 30 minutes of finishing meals, on days of radiation only.  Xeloda and radiation start date: 01/11/21  Adverse effects of Xeloda include but are not limited to: fatigue, decreased blood counts, GI upset, diarrhea, mouth sores, and hand-foot syndrome.  Patient has anti-emetic on hand and knows to take it if nausea develops.   Patient will obtain anti diarrheal and alert the office of 4 or more loose stools above baseline.   Reviewed with patient importance of keeping a medication schedule and plan for any missed doses. No barriers to medication adherence identified.  Medication reconciliation performed and medication/allergy list updated.  Insurance authorization for Xeloda has been obtained. Test claim at the pharmacy revealed copayment $0 for 1st fill of Xeloda. This will ship from the [facility] on 01/06/21 to deliver to patient's home on 01/07/21.  Patient informed the pharmacy will reach out 5-7 days prior to needing next fill of Xeloda to coordinate continued medication acquisition to prevent break in therapy.  All questions answered.  Walter [name] voiced understanding and appreciation.   Medication education handout placed in mail for patient. Patient knows to call the office with questions or concerns. Oral Chemotherapy Clinic phone number provided to patient.   Leron Croak, PharmD, BCPS Hematology/Oncology Clinical Pharmacist [facility] [phone] 01/07/2021 10:12 AM

## 2021-01-08 ENCOUNTER — Other Ambulatory Visit (HOSPITAL_COMMUNITY): Payer: Self-pay

## 2021-01-10 DIAGNOSIS — Z510 Encounter for antineoplastic radiation therapy: Secondary | ICD-10-CM | POA: Diagnosis not present

## 2021-01-11 ENCOUNTER — Ambulatory Visit
Admission: RE | Admit: 2021-01-11 | Discharge: 2021-01-11 | Disposition: A | Discharge status: Home / Self Care | Payer: Medicare Other | Source: Ambulatory Visit | Attending: Radiation Oncology | Admitting: Radiation Oncology

## 2021-01-11 ENCOUNTER — Inpatient Hospital Stay: Payer: Medicare Other

## 2021-01-11 ENCOUNTER — Inpatient Hospital Stay (HOSPITAL_BASED_OUTPATIENT_CLINIC_OR_DEPARTMENT_OTHER): Payer: Medicare Other | Admitting: Nurse Practitioner

## 2021-01-11 ENCOUNTER — Encounter: Payer: Self-pay | Admitting: Nurse Practitioner

## 2021-01-11 ENCOUNTER — Telehealth: Payer: Self-pay | Admitting: Nurse Practitioner

## 2021-01-11 ENCOUNTER — Other Ambulatory Visit: Payer: Self-pay

## 2021-01-11 VITALS — BP 139/71 | HR 91 | Temp 98.1°F | Resp 20 | Ht 69.0 in | Wt 179.6 lb

## 2021-01-11 DIAGNOSIS — C20 Malignant neoplasm of rectum: Secondary | ICD-10-CM

## 2021-01-11 DIAGNOSIS — Z510 Encounter for antineoplastic radiation therapy: Secondary | ICD-10-CM | POA: Diagnosis not present

## 2021-01-11 DIAGNOSIS — Z95828 Presence of other vascular implants and grafts: Secondary | ICD-10-CM

## 2021-01-11 LAB — CMP (CANCER CENTER ONLY)
ALT: 23 U/L (ref 0–44)
AST: 23 U/L (ref 15–41)
Albumin: 3.6 g/dL (ref 3.5–5.0)
Alkaline Phosphatase: 64 U/L (ref 38–126)
Anion gap: 9 (ref 5–15)
BUN: 29 mg/dL — ABNORMAL HIGH (ref 8–23)
CO2: 25 mmol/L (ref 22–32)
Calcium: 9.1 mg/dL (ref 8.9–10.3)
Chloride: 106 mmol/L (ref 98–111)
Creatinine: 1.79 mg/dL — ABNORMAL HIGH (ref 0.61–1.24)
GFR, Estimated: 40 mL/min — ABNORMAL LOW (ref >60)
Glucose, Bld: 90 mg/dL (ref 70–99)
Potassium: 3.9 mmol/L (ref 3.5–5.1)
Sodium: 140 mmol/L (ref 135–145)
Total Bilirubin: 0.5 mg/dL (ref 0.3–1.2)
Total Protein: 6.3 g/dL — ABNORMAL LOW (ref 6.5–8.1)

## 2021-01-11 LAB — CBC WITH DIFFERENTIAL (CANCER CENTER ONLY)
Abs Immature Granulocytes: 0.02 10*3/uL (ref 0.00–0.07)
Basophils Absolute: 0.1 10*3/uL (ref 0.0–0.1)
Basophils Relative: 2 %
Eosinophils Absolute: 0.1 10*3/uL (ref 0.0–0.5)
Eosinophils Relative: 2 %
HCT: 30.3 % — ABNORMAL LOW (ref 39.0–52.0)
Hemoglobin: 9.6 g/dL — ABNORMAL LOW (ref 13.0–17.0)
Immature Granulocytes: 0 %
Lymphocytes Relative: 30 %
Lymphs Abs: 1.5 10*3/uL (ref 0.7–4.0)
MCH: 30.2 pg (ref 26.0–34.0)
MCHC: 31.7 g/dL (ref 30.0–36.0)
MCV: 95.3 fL (ref 80.0–100.0)
Monocytes Absolute: 1.3 10*3/uL — ABNORMAL HIGH (ref 0.1–1.0)
Monocytes Relative: 25 %
Neutro Abs: 2 10*3/uL (ref 1.7–7.7)
Neutrophils Relative %: 41 %
Platelet Count: 287 10*3/uL (ref 150–400)
RBC: 3.18 MIL/uL — ABNORMAL LOW (ref 4.22–5.81)
RDW: 22.5 % — ABNORMAL HIGH (ref 11.5–15.5)
WBC Count: 5 10*3/uL (ref 4.0–10.5)
nRBC: 0 % (ref 0.0–0.2)

## 2021-01-11 LAB — MAGNESIUM: Magnesium: 1.8 mg/dL (ref 1.7–2.4)

## 2021-01-11 MED ORDER — SODIUM CHLORIDE 0.9% FLUSH
10.0000 mL | Freq: Once | INTRAVENOUS | Status: AC
  Administered 2021-01-11: 10 mL
  Filled 2021-01-11: qty 10

## 2021-01-11 MED ORDER — HEPARIN SOD (PORK) LOCK FLUSH 100 UNIT/ML IV SOLN
500.0000 [IU] | Freq: Once | INTRAVENOUS | Status: AC
  Administered 2021-01-11: 500 [IU]
  Filled 2021-01-11: qty 5

## 2021-01-11 NOTE — Telephone Encounter (Signed)
Appointments scheduled per 4/25 los. I called and left a detailed message for patient

## 2021-01-11 NOTE — Progress Notes (Signed)
  [place] OFFICE PROGRESS NOTE   Diagnosis: Rectal cancer  INTERVAL HISTORY:   Mr. Walter [name] returns as scheduled.  He completed cycle 8 FOLFOX 12/29/2020.  Oxaliplatin, 5-FU bolus and Udenyca held.  Mouth and throat soreness has resolved.  He has occasional nausea.  No vomiting.  Some diarrhea.  He has numbness and tingling in the hands and feet.  He feels "wobbly" at times.  He has noted leg swelling since discontinuing chlorthalidone.  Objective:  Vital signs in last 24 hours:  Blood pressure 139/71, pulse 91, temperature 98.1 F (36.7 C), temperature source Tympanic, resp. rate 20, height 5\' 9"  (1.753 m), weight 179 lb 9.6 oz (81.5 kg), SpO2 100 %.    HEENT: No thrush or ulcers. Resp: Lungs clear bilaterally. Cardio: Regular rate and rhythm. GI: Abdomen soft and nontender.  No hepatomegaly. Vascular: Trace edema lower leg bilaterally.  Skin: Palms with hyperpigmentation.  No erythema. Port-A-Cath without erythema.   Lab Results:  Lab Results  Component Value Date   WBC 5.0 01/11/2021   HGB 9.6 (L) 01/11/2021   HCT 30.3 (L) 01/11/2021   MCV 95.3 01/11/2021   PLT 287 01/11/2021   NEUTROABS 2.0 01/11/2021    Imaging:  No results found.  Medications: I have reviewed the patient's current medications.  Assessment/Plan: 1. Rectal cancer ? Rectal nodularity noted on colonoscopy 06/29/2020, removal of a rectal "polyp"revealed a tubulovillous adenoma with high-grade dysplasia ? Physical exam 08/04/2020-perianal mass invading the anal canal and sphincter complex-biopsy positive for mucinous adenocarcinoma ? CTs 08/17/2020-no rectal mass identified, multiple liver cyst and too small to characterize low-attenuation liver lesions, midline ventral hernia containing proximal right colon, groundglass left upper lobe nodule and nonspecific 3 mm right middle lobe nodule ? Cycle1 FOLFOX 09/15/2020 ? MRI pelvis [facility] 09/17/2020-anal and low rectal mass.  Internal and external sphincters involved by the mass. Subtle extension of the tumor through the serosa of the low rectum. No local regional adenopathy. ? MRI liver at [facility] 09/17/2020-scattered hepatic cysts. No evidence of metastatic disease. ? Cycle 2 FOLFOX 09/29/2020 ? Cycle 3 FOLFOX 10/13/2020 ? Cycle 4 FOLFOX 10/27/2020-Udenyca ? Cycle 5 FOLFOX 11/19/2020-Udenyca ? Cycle 6 FOLFOX 12/02/2020-Udenyca ? Cycle 7 FOLFOX 12/15/2020-Udenyca ? Cycle 8 FOLFOX 12/29/2020 -oxaliplatin, 5-FU bolus, and Udenyca held ? Radiation/Xeloda 01/11/2021 2. Renal insufficiency 3. 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 Hypertension 5. History of kidney stones 6. Sleep apnea 7. Umbilical hernia repair 8. 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 Hospital admission 11/08/2020-E. 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 Grzelak appears stable.  He has completed 8 cycles of FOLFOX.  He began radiation earlier today.  He will begin Xeloda today as well.  We again reviewed potential toxicities.  He agrees to proceed.  We reviewed the CBC and chemistry panel from today.  Labs adequate to proceed as above.  He will return for lab and follow-up on 01/22/2021.  He will contact the office in the interim with any problems.  Ned Card ANP/GNP-BC   01/11/2021  11:09 AM

## 2021-01-12 ENCOUNTER — Other Ambulatory Visit (HOSPITAL_COMMUNITY): Payer: Self-pay

## 2021-01-12 ENCOUNTER — Ambulatory Visit
Admission: RE | Admit: 2021-01-12 | Discharge: 2021-01-12 | Disposition: A | Discharge status: Home / Self Care | Payer: Medicare Other | Source: Ambulatory Visit | Attending: Radiation Oncology | Admitting: Radiation Oncology

## 2021-01-12 DIAGNOSIS — Z510 Encounter for antineoplastic radiation therapy: Secondary | ICD-10-CM | POA: Diagnosis not present

## 2021-01-13 ENCOUNTER — Ambulatory Visit
Admission: RE | Admit: 2021-01-13 | Discharge: 2021-01-13 | Disposition: A | Discharge status: Home / Self Care | Payer: Medicare Other | Source: Ambulatory Visit | Attending: Radiation Oncology | Admitting: Radiation Oncology

## 2021-01-13 ENCOUNTER — Other Ambulatory Visit: Payer: Self-pay

## 2021-01-13 DIAGNOSIS — Z510 Encounter for antineoplastic radiation therapy: Secondary | ICD-10-CM | POA: Diagnosis not present

## 2021-01-14 ENCOUNTER — Ambulatory Visit
Admission: RE | Admit: 2021-01-14 | Discharge: 2021-01-14 | Disposition: A | Discharge status: Home / Self Care | Payer: Medicare Other | Source: Ambulatory Visit | Attending: Radiation Oncology | Admitting: Radiation Oncology

## 2021-01-14 DIAGNOSIS — Z510 Encounter for antineoplastic radiation therapy: Secondary | ICD-10-CM | POA: Diagnosis not present

## 2021-01-15 ENCOUNTER — Other Ambulatory Visit: Payer: Self-pay

## 2021-01-15 ENCOUNTER — Ambulatory Visit
Admission: RE | Admit: 2021-01-15 | Discharge: 2021-01-15 | Disposition: A | Discharge status: Home / Self Care | Payer: Medicare Other | Source: Ambulatory Visit | Attending: Radiation Oncology | Admitting: Radiation Oncology

## 2021-01-15 ENCOUNTER — Other Ambulatory Visit (HOSPITAL_COMMUNITY): Payer: Self-pay

## 2021-01-15 DIAGNOSIS — Z510 Encounter for antineoplastic radiation therapy: Secondary | ICD-10-CM | POA: Diagnosis not present

## 2021-01-15 NOTE — Progress Notes (Signed)
Pt here for patient teaching.  Pt given Radiation and You booklet.  Reviewed areas of pertinence such as diarrhea, fatigue, hair loss, sexual and fertility changes, skin changes and urinary and bladder changes . Pt able to give teach back of to pat skin, use unscented/gentle soap, use baby wipes, have Imodium on hand, drink plenty of water and sitz bath,avoid applying anything to skin within 4 hours of treatment. Pt verbalizes understanding of information given and will contact nursing with any questions or concerns.          

## 2021-01-16 ENCOUNTER — Other Ambulatory Visit (HOSPITAL_COMMUNITY): Payer: Self-pay

## 2021-01-18 ENCOUNTER — Other Ambulatory Visit: Payer: Self-pay

## 2021-01-18 ENCOUNTER — Ambulatory Visit
Admission: RE | Admit: 2021-01-18 | Discharge: 2021-01-18 | Disposition: A | Discharge status: Home / Self Care | Payer: Medicare Other | Source: Ambulatory Visit | Attending: Radiation Oncology | Admitting: Radiation Oncology

## 2021-01-18 DIAGNOSIS — C20 Malignant neoplasm of rectum: Secondary | ICD-10-CM | POA: Insufficient documentation

## 2021-01-18 DIAGNOSIS — Z452 Encounter for adjustment and management of vascular access device: Secondary | ICD-10-CM | POA: Diagnosis not present

## 2021-01-18 DIAGNOSIS — Z510 Encounter for antineoplastic radiation therapy: Secondary | ICD-10-CM | POA: Diagnosis not present

## 2021-01-18 DIAGNOSIS — Z923 Personal history of irradiation: Secondary | ICD-10-CM | POA: Insufficient documentation

## 2021-01-19 ENCOUNTER — Ambulatory Visit
Admission: RE | Admit: 2021-01-19 | Discharge: 2021-01-19 | Disposition: A | Discharge status: Home / Self Care | Payer: Medicare Other | Source: Ambulatory Visit | Attending: Radiation Oncology | Admitting: Radiation Oncology

## 2021-01-19 ENCOUNTER — Other Ambulatory Visit (HOSPITAL_COMMUNITY): Payer: Self-pay

## 2021-01-19 DIAGNOSIS — Z510 Encounter for antineoplastic radiation therapy: Secondary | ICD-10-CM | POA: Diagnosis not present

## 2021-01-20 ENCOUNTER — Other Ambulatory Visit (HOSPITAL_COMMUNITY): Payer: Self-pay

## 2021-01-20 ENCOUNTER — Other Ambulatory Visit: Payer: Self-pay

## 2021-01-20 ENCOUNTER — Ambulatory Visit
Admission: RE | Admit: 2021-01-20 | Discharge: 2021-01-20 | Disposition: A | Discharge status: Home / Self Care | Payer: Medicare Other | Source: Ambulatory Visit | Attending: Radiation Oncology | Admitting: Radiation Oncology

## 2021-01-20 DIAGNOSIS — Z510 Encounter for antineoplastic radiation therapy: Secondary | ICD-10-CM | POA: Diagnosis not present

## 2021-01-21 ENCOUNTER — Ambulatory Visit
Admission: RE | Admit: 2021-01-21 | Discharge: 2021-01-21 | Disposition: A | Discharge status: Home / Self Care | Payer: Medicare Other | Source: Ambulatory Visit | Attending: Radiation Oncology | Admitting: Radiation Oncology

## 2021-01-21 ENCOUNTER — Other Ambulatory Visit (HOSPITAL_COMMUNITY): Payer: Self-pay

## 2021-01-21 DIAGNOSIS — Z510 Encounter for antineoplastic radiation therapy: Secondary | ICD-10-CM | POA: Diagnosis not present

## 2021-01-22 ENCOUNTER — Other Ambulatory Visit: Payer: Medicare Other

## 2021-01-22 ENCOUNTER — Other Ambulatory Visit: Payer: Self-pay

## 2021-01-22 ENCOUNTER — Encounter: Payer: Self-pay | Admitting: *Deleted

## 2021-01-22 ENCOUNTER — Inpatient Hospital Stay (HOSPITAL_BASED_OUTPATIENT_CLINIC_OR_DEPARTMENT_OTHER): Payer: Medicare Other | Admitting: Oncology

## 2021-01-22 ENCOUNTER — Ambulatory Visit: Payer: Medicare Other | Admitting: Oncology

## 2021-01-22 ENCOUNTER — Inpatient Hospital Stay: Payer: Medicare Other

## 2021-01-22 ENCOUNTER — Other Ambulatory Visit (HOSPITAL_COMMUNITY): Payer: Self-pay

## 2021-01-22 ENCOUNTER — Ambulatory Visit
Admission: RE | Admit: 2021-01-22 | Discharge: 2021-01-22 | Disposition: A | Discharge status: Home / Self Care | Payer: Medicare Other | Source: Ambulatory Visit | Attending: Radiation Oncology | Admitting: Radiation Oncology

## 2021-01-22 VITALS — BP 133/62 | HR 80 | Temp 98.2°F | Resp 20 | Ht 69.0 in | Wt 177.8 lb

## 2021-01-22 DIAGNOSIS — Z452 Encounter for adjustment and management of vascular access device: Secondary | ICD-10-CM | POA: Insufficient documentation

## 2021-01-22 DIAGNOSIS — Z510 Encounter for antineoplastic radiation therapy: Secondary | ICD-10-CM | POA: Diagnosis not present

## 2021-01-22 DIAGNOSIS — Z923 Personal history of irradiation: Secondary | ICD-10-CM | POA: Insufficient documentation

## 2021-01-22 DIAGNOSIS — Z95828 Presence of other vascular implants and grafts: Secondary | ICD-10-CM

## 2021-01-22 DIAGNOSIS — C20 Malignant neoplasm of rectum: Secondary | ICD-10-CM | POA: Insufficient documentation

## 2021-01-22 LAB — CBC WITH DIFFERENTIAL (CANCER CENTER ONLY)
Abs Immature Granulocytes: 0.02 10*3/uL (ref 0.00–0.07)
Basophils Absolute: 0 10*3/uL (ref 0.0–0.1)
Basophils Relative: 1 %
Eosinophils Absolute: 0.3 10*3/uL (ref 0.0–0.5)
Eosinophils Relative: 7 %
HCT: 30.2 % — ABNORMAL LOW (ref 39.0–52.0)
Hemoglobin: 9.9 g/dL — ABNORMAL LOW (ref 13.0–17.0)
Immature Granulocytes: 1 %
Lymphocytes Relative: 15 %
Lymphs Abs: 0.6 10*3/uL — ABNORMAL LOW (ref 0.7–4.0)
MCH: 31.4 pg (ref 26.0–34.0)
MCHC: 32.8 g/dL (ref 30.0–36.0)
MCV: 95.9 fL (ref 80.0–100.0)
Monocytes Absolute: 0.5 10*3/uL (ref 0.1–1.0)
Monocytes Relative: 14 %
Neutro Abs: 2.4 10*3/uL (ref 1.7–7.7)
Neutrophils Relative %: 62 %
Platelet Count: 227 10*3/uL (ref 150–400)
RBC: 3.15 MIL/uL — ABNORMAL LOW (ref 4.22–5.81)
RDW: 19.8 % — ABNORMAL HIGH (ref 11.5–15.5)
WBC Count: 3.8 10*3/uL — ABNORMAL LOW (ref 4.0–10.5)
nRBC: 0 % (ref 0.0–0.2)

## 2021-01-22 LAB — CMP (CANCER CENTER ONLY)
ALT: 16 U/L (ref 0–44)
AST: 18 U/L (ref 15–41)
Albumin: 3.6 g/dL (ref 3.5–5.0)
Alkaline Phosphatase: 54 U/L (ref 38–126)
Anion gap: 7 (ref 5–15)
BUN: 29 mg/dL — ABNORMAL HIGH (ref 8–23)
CO2: 27 mmol/L (ref 22–32)
Calcium: 8.9 mg/dL (ref 8.9–10.3)
Chloride: 102 mmol/L (ref 98–111)
Creatinine: 1.91 mg/dL — ABNORMAL HIGH (ref 0.61–1.24)
GFR, Estimated: 37 mL/min — ABNORMAL LOW (ref >60)
Glucose, Bld: 102 mg/dL — ABNORMAL HIGH (ref 70–99)
Potassium: 3.1 mmol/L — ABNORMAL LOW (ref 3.5–5.1)
Sodium: 136 mmol/L (ref 135–145)
Total Bilirubin: 0.6 mg/dL (ref 0.3–1.2)
Total Protein: 6.5 g/dL (ref 6.5–8.1)

## 2021-01-22 MED ORDER — SODIUM CHLORIDE 0.9% FLUSH
10.0000 mL | Freq: Once | INTRAVENOUS | Status: AC
Start: 2021-01-22 — End: 2021-01-22
  Administered 2021-01-22: 10 mL
  Filled 2021-01-22: qty 10

## 2021-01-22 MED ORDER — HEPARIN SOD (PORK) LOCK FLUSH 100 UNIT/ML IV SOLN
500.0000 [IU] | Freq: Once | INTRAVENOUS | Status: AC
Start: 2021-01-22 — End: 2021-01-22
  Administered 2021-01-22: 500 [IU]
  Filled 2021-01-22: qty 5

## 2021-01-22 NOTE — Progress Notes (Signed)
[place] OFFICE PROGRESS NOTE   Diagnosis: Rectal cancer  INTERVAL HISTORY:   Walter [name] begin concurrent Xeloda and radiation on 01/11/2021.  No mouth sores, nausea, or diarrhea.  He has developed increased numbness, tingling, and discomfort at the hands and feet.  He has difficulty opening bottles.  Objective:  Vital signs in last 24 hours:  Blood pressure 133/62, pulse 80, temperature 98.2 F (36.8 C), temperature source Oral, resp. rate 20, height 5\' 9"  (1.753 m), weight 177 lb 12.8 oz (80.6 kg), SpO2 100 %.    HEENT: No thrush or ulcers, mild white coat over the tongue Resp: Lungs clear bilaterally Cardio: Regular rate and rhythm GI: Nontender, no hepatosplenomegaly Vascular: No leg edema  Skin: Mild hyperpigmentation and skin thickening at the hands and feet, no erythema or skin breakdown  Portacath/PICC-without erythema  Lab Results:  Lab Results  Component Value Date   WBC 3.8 (L) 01/22/2021   HGB 9.9 (L) 01/22/2021   HCT 30.2 (L) 01/22/2021   MCV 95.9 01/22/2021   PLT 227 01/22/2021   NEUTROABS 2.4 01/22/2021    CMP  Lab Results  Component Value Date   NA 136 01/22/2021   K 3.1 (L) 01/22/2021   CL 102 01/22/2021   CO2 27 01/22/2021   GLUCOSE 102 (H) 01/22/2021   BUN 29 (H) 01/22/2021   CREATININE 1.91 (H) 01/22/2021   CALCIUM 8.9 01/22/2021   PROT 6.5 01/22/2021   ALBUMIN 3.6 01/22/2021   AST 18 01/22/2021   ALT 16 01/22/2021   ALKPHOS 54 01/22/2021   BILITOT 0.6 01/22/2021   GFRNONAA 37 (L) 01/22/2021    Lab Results  Component Value Date   CEA1 5.53 (H) 11/17/2020    Medications: I have reviewed the patient's current medications.   Assessment/Plan: 1. Rectal cancer ? Rectal nodularity noted on colonoscopy 06/29/2020, removal of a rectal "polyp"revealed a tubulovillous adenoma with high-grade dysplasia ? Physical exam 08/04/2020-perianal mass invading the anal canal and sphincter complex-biopsy positive for mucinous  adenocarcinoma ? CTs 08/17/2020-no rectal mass identified, multiple liver cyst and too small to characterize low-attenuation liver lesions, midline ventral hernia containing proximal right colon, groundglass left upper lobe nodule and nonspecific 3 mm right middle lobe nodule ? Cycle1 FOLFOX 09/15/2020 ? MRI pelvis at [facility] 09/17/2020-anal and low rectal mass. Internal and external sphincters involved by the mass. Subtle extension of the tumor through the serosa of the low rectum. No local regional adenopathy. ? MRI liver at [facility] 09/17/2020-scattered hepatic cysts. No evidence of metastatic disease. ? Cycle 2 FOLFOX 09/29/2020 ? Cycle 3 FOLFOX 10/13/2020 ? Cycle 4 FOLFOX 10/27/2020-Udenyca ? Cycle 5 FOLFOX 11/19/2020-Udenyca ? Cycle 6 FOLFOX 12/02/2020-Udenyca ? Cycle 7 FOLFOX 12/15/2020-Udenyca ? Cycle 8 FOLFOX 12/29/2020 -oxaliplatin, 5-FU bolus, and Udenyca held ? Radiation/Xeloda 01/11/2021 2. Renal insufficiency 3. 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 Hypertension 5. History of kidney stones 6. Sleep apnea 7. Umbilical hernia repair 8. 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 Hospital admission 11/08/2020-E. 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  He will continue the current treatment.  He will return for an office and lab visit in 2 weeks.  He has developed oxaliplatin neuropathy.  Hopefully this will  improve over the next few months.  He will schedule appoint with Dr. Morton Stall for surgical planning.  Betsy Coder, MD  01/22/2021  10:57 AM

## 2021-01-25 ENCOUNTER — Other Ambulatory Visit: Payer: Self-pay

## 2021-01-25 ENCOUNTER — Telehealth: Payer: Self-pay

## 2021-01-25 ENCOUNTER — Ambulatory Visit
Admission: RE | Admit: 2021-01-25 | Discharge: 2021-01-25 | Disposition: A | Discharge status: Home / Self Care | Payer: Medicare Other | Source: Ambulatory Visit | Attending: Radiation Oncology | Admitting: Radiation Oncology

## 2021-01-25 DIAGNOSIS — Z510 Encounter for antineoplastic radiation therapy: Secondary | ICD-10-CM | POA: Diagnosis not present

## 2021-01-25 NOTE — Telephone Encounter (Signed)
Called spoke to pt about lab results potassium slightly low asked if pt was taking potassium pt confirms he is taking 15meq po BID

## 2021-01-25 NOTE — Telephone Encounter (Signed)
-----   Message from Owens Shark, NP sent at 01/22/2021  2:03 PM EDT ----- Please let him know potassium level is low.  Is he taking potassium?  Please let me know the current dose.

## 2021-01-26 ENCOUNTER — Ambulatory Visit
Admission: RE | Admit: 2021-01-26 | Discharge: 2021-01-26 | Disposition: A | Discharge status: Home / Self Care | Payer: Medicare Other | Source: Ambulatory Visit | Attending: Radiation Oncology | Admitting: Radiation Oncology

## 2021-01-26 DIAGNOSIS — Z510 Encounter for antineoplastic radiation therapy: Secondary | ICD-10-CM | POA: Diagnosis not present

## 2021-01-26 NOTE — Telephone Encounter (Signed)
I will complete

## 2021-01-27 ENCOUNTER — Ambulatory Visit
Admission: RE | Admit: 2021-01-27 | Discharge: 2021-01-27 | Disposition: A | Discharge status: Home / Self Care | Payer: Medicare Other | Source: Ambulatory Visit | Attending: Radiation Oncology | Admitting: Radiation Oncology

## 2021-01-27 ENCOUNTER — Other Ambulatory Visit: Payer: Self-pay

## 2021-01-27 DIAGNOSIS — Z510 Encounter for antineoplastic radiation therapy: Secondary | ICD-10-CM | POA: Diagnosis not present

## 2021-01-28 ENCOUNTER — Ambulatory Visit
Admission: RE | Admit: 2021-01-28 | Discharge: 2021-01-28 | Disposition: A | Discharge status: Home / Self Care | Payer: Medicare Other | Source: Ambulatory Visit | Attending: Radiation Oncology | Admitting: Radiation Oncology

## 2021-01-28 DIAGNOSIS — Z510 Encounter for antineoplastic radiation therapy: Secondary | ICD-10-CM | POA: Diagnosis not present

## 2021-01-29 ENCOUNTER — Other Ambulatory Visit: Payer: Self-pay

## 2021-01-29 ENCOUNTER — Ambulatory Visit
Admission: RE | Admit: 2021-01-29 | Discharge: 2021-01-29 | Disposition: A | Discharge status: Home / Self Care | Payer: Medicare Other | Source: Ambulatory Visit | Attending: Radiation Oncology | Admitting: Radiation Oncology

## 2021-01-29 DIAGNOSIS — Z510 Encounter for antineoplastic radiation therapy: Secondary | ICD-10-CM | POA: Diagnosis not present

## 2021-02-01 ENCOUNTER — Other Ambulatory Visit (HOSPITAL_COMMUNITY): Payer: Self-pay

## 2021-02-01 ENCOUNTER — Ambulatory Visit
Admission: RE | Admit: 2021-02-01 | Discharge: 2021-02-01 | Disposition: A | Discharge status: Home / Self Care | Payer: Medicare Other | Source: Ambulatory Visit | Attending: Radiation Oncology | Admitting: Radiation Oncology

## 2021-02-01 DIAGNOSIS — Z510 Encounter for antineoplastic radiation therapy: Secondary | ICD-10-CM | POA: Diagnosis not present

## 2021-02-02 ENCOUNTER — Other Ambulatory Visit: Payer: Self-pay

## 2021-02-02 ENCOUNTER — Ambulatory Visit
Admission: RE | Admit: 2021-02-02 | Discharge: 2021-02-02 | Disposition: A | Discharge status: Home / Self Care | Payer: Medicare Other | Source: Ambulatory Visit | Attending: Radiation Oncology | Admitting: Radiation Oncology

## 2021-02-02 DIAGNOSIS — Z510 Encounter for antineoplastic radiation therapy: Secondary | ICD-10-CM | POA: Diagnosis not present

## 2021-02-03 ENCOUNTER — Ambulatory Visit
Admission: RE | Admit: 2021-02-03 | Discharge: 2021-02-03 | Disposition: A | Discharge status: Home / Self Care | Payer: Medicare Other | Source: Ambulatory Visit | Attending: Radiation Oncology | Admitting: Radiation Oncology

## 2021-02-03 DIAGNOSIS — Z510 Encounter for antineoplastic radiation therapy: Secondary | ICD-10-CM | POA: Diagnosis not present

## 2021-02-04 ENCOUNTER — Ambulatory Visit
Admission: RE | Admit: 2021-02-04 | Discharge: 2021-02-04 | Disposition: A | Discharge status: Home / Self Care | Payer: Medicare Other | Source: Ambulatory Visit | Attending: Radiation Oncology | Admitting: Radiation Oncology

## 2021-02-04 ENCOUNTER — Other Ambulatory Visit: Payer: Self-pay

## 2021-02-04 ENCOUNTER — Ambulatory Visit: Payer: Medicare Other | Admitting: Oncology

## 2021-02-04 ENCOUNTER — Other Ambulatory Visit: Payer: Medicare Other

## 2021-02-04 DIAGNOSIS — Z510 Encounter for antineoplastic radiation therapy: Secondary | ICD-10-CM | POA: Diagnosis not present

## 2021-02-05 ENCOUNTER — Ambulatory Visit
Admission: RE | Admit: 2021-02-05 | Discharge: 2021-02-05 | Disposition: A | Discharge status: Home / Self Care | Payer: Medicare Other | Source: Ambulatory Visit | Attending: Radiation Oncology | Admitting: Radiation Oncology

## 2021-02-05 ENCOUNTER — Other Ambulatory Visit: Payer: Self-pay

## 2021-02-05 ENCOUNTER — Telehealth: Payer: Self-pay

## 2021-02-05 ENCOUNTER — Inpatient Hospital Stay: Payer: Medicare Other

## 2021-02-05 ENCOUNTER — Inpatient Hospital Stay (HOSPITAL_BASED_OUTPATIENT_CLINIC_OR_DEPARTMENT_OTHER): Payer: Medicare Other | Admitting: Oncology

## 2021-02-05 ENCOUNTER — Other Ambulatory Visit (HOSPITAL_COMMUNITY): Payer: Self-pay

## 2021-02-05 VITALS — BP 141/72 | HR 99 | Temp 98.1°F | Resp 18 | Ht 69.0 in | Wt 174.0 lb

## 2021-02-05 DIAGNOSIS — C20 Malignant neoplasm of rectum: Secondary | ICD-10-CM | POA: Diagnosis not present

## 2021-02-05 DIAGNOSIS — Z510 Encounter for antineoplastic radiation therapy: Secondary | ICD-10-CM | POA: Diagnosis not present

## 2021-02-05 DIAGNOSIS — Z95828 Presence of other vascular implants and grafts: Secondary | ICD-10-CM

## 2021-02-05 LAB — CBC WITH DIFFERENTIAL (CANCER CENTER ONLY)
Abs Immature Granulocytes: 0.01 10*3/uL (ref 0.00–0.07)
Basophils Absolute: 0 10*3/uL (ref 0.0–0.1)
Basophils Relative: 0 %
Eosinophils Absolute: 0.4 10*3/uL (ref 0.0–0.5)
Eosinophils Relative: 8 %
HCT: 28.1 % — ABNORMAL LOW (ref 39.0–52.0)
Hemoglobin: 9.3 g/dL — ABNORMAL LOW (ref 13.0–17.0)
Immature Granulocytes: 0 %
Lymphocytes Relative: 7 %
Lymphs Abs: 0.3 10*3/uL — ABNORMAL LOW (ref 0.7–4.0)
MCH: 32 pg (ref 26.0–34.0)
MCHC: 33.1 g/dL (ref 30.0–36.0)
MCV: 96.6 fL (ref 80.0–100.0)
Monocytes Absolute: 0.5 10*3/uL (ref 0.1–1.0)
Monocytes Relative: 10 %
Neutro Abs: 3.8 10*3/uL (ref 1.7–7.7)
Neutrophils Relative %: 75 %
Platelet Count: 135 10*3/uL — ABNORMAL LOW (ref 150–400)
RBC: 2.91 MIL/uL — ABNORMAL LOW (ref 4.22–5.81)
RDW: 18.6 % — ABNORMAL HIGH (ref 11.5–15.5)
WBC Count: 5.1 10*3/uL (ref 4.0–10.5)
nRBC: 0 % (ref 0.0–0.2)

## 2021-02-05 LAB — CMP (CANCER CENTER ONLY)
ALT: 10 U/L (ref 0–44)
AST: 18 U/L (ref 15–41)
Albumin: 3.7 g/dL (ref 3.5–5.0)
Alkaline Phosphatase: 53 U/L (ref 38–126)
Anion gap: 12 (ref 5–15)
BUN: 29 mg/dL — ABNORMAL HIGH (ref 8–23)
CO2: 22 mmol/L (ref 22–32)
Calcium: 8.6 mg/dL — ABNORMAL LOW (ref 8.9–10.3)
Chloride: 102 mmol/L (ref 98–111)
Creatinine: 2.24 mg/dL — ABNORMAL HIGH (ref 0.61–1.24)
GFR, Estimated: 31 mL/min — ABNORMAL LOW (ref >60)
Glucose, Bld: 100 mg/dL — ABNORMAL HIGH (ref 70–99)
Potassium: 3.4 mmol/L — ABNORMAL LOW (ref 3.5–5.1)
Sodium: 136 mmol/L (ref 135–145)
Total Bilirubin: 0.6 mg/dL (ref 0.3–1.2)
Total Protein: 6.8 g/dL (ref 6.5–8.1)

## 2021-02-05 LAB — MAGNESIUM: Magnesium: 1.4 mg/dL — ABNORMAL LOW (ref 1.7–2.4)

## 2021-02-05 LAB — CEA (ACCESS): CEA (CHCC): 5.69 ng/mL — ABNORMAL HIGH (ref 0.00–5.00)

## 2021-02-05 MED ORDER — HEPARIN SOD (PORK) LOCK FLUSH 100 UNIT/ML IV SOLN
500.0000 [IU] | Freq: Once | INTRAVENOUS | Status: AC
Start: 2021-02-05 — End: 2021-02-05
  Administered 2021-02-05: 500 [IU]
  Filled 2021-02-05: qty 5

## 2021-02-05 MED ORDER — DULOXETINE HCL 30 MG PO CPEP: 30.0000 mg | ORAL_CAPSULE | Freq: Every day | ORAL | 0 refills | Status: DC

## 2021-02-05 MED ORDER — SODIUM CHLORIDE 0.9% FLUSH
10.0000 mL | Freq: Once | INTRAVENOUS | Status: AC
  Administered 2021-02-05: 10 mL
  Filled 2021-02-05: qty 10

## 2021-02-05 MED ORDER — DULOXETINE HCL 60 MG PO CPEP: 60.0000 mg | ORAL_CAPSULE | Freq: Every day | ORAL | 1 refills | Status: DC

## 2021-02-05 MED ORDER — MAGNESIUM OXIDE -MG SUPPLEMENT 400 (240 MG) MG PO TABS: 400.0000 mg | ORAL_TABLET | Freq: Two times a day (BID) | ORAL | 0 refills | Status: DC

## 2021-02-05 NOTE — Telephone Encounter (Signed)
TC to Pt to inform him that his magnesium level is low  And Dr Benay Spice wants him to start magnesium 400 mg twice daily. While in conversation with Pt Dr Benay Spice wanted Pt to increase potassium to 3 times a day. But decided Pt should should stay with current dose of potassium 2 times a day. Pt verbalized understanding. Pt stated he would be going out of town a will not be able to start taking the potassium until Monday.

## 2021-02-05 NOTE — Progress Notes (Signed)
[place] OFFICE PROGRESS NOTE   Diagnosis: Rectal cancer  INTERVAL HISTORY:   Walter [name] returns as scheduled.  He continues Xeloda and radiation.  No nausea or mouth sores.  He has frequent loose bowel movements.  He reports having a large bowel movement in the morning followed by frequent small loose stools during the day.  He takes Imodium a few times daily.  He has developed skin breakdown at the penis and perineum.  He reports Dr. Lisbeth Renshaw checked the penis earlier today.  Mr. Weekes complains of numbness, tingling, and pain in the extremities.  The numbness interferes with activity including opening bottles.  Objective:  Vital signs in last 24 hours:  Blood pressure (!) 141/72, pulse 99, temperature 98.1 F (36.7 C), temperature source Oral, resp. rate 18, height 5\' 9"  (1.753 m), weight 174 lb (78.9 kg), SpO2 100 %.    HEENT: No thrush or ulcers, hyperpigmentation Resp: Lungs clear bilaterally Cardio: Regular rate and rhythm GI: No hepatosplenomegaly, nontender Vascular: No leg edema  Skin: Dryness and hyperpigmentation of the palms and soles.  Dry desquamation at the soles.  There is superficial skin breakdown at the head of the penis and at the perineum  Portacath/PICC-without erythema  Lab Results:  Lab Results  Component Value Date   WBC 3.8 (L) 01/22/2021   HGB 9.9 (L) 01/22/2021   HCT 30.2 (L) 01/22/2021   MCV 95.9 01/22/2021   PLT 227 01/22/2021   NEUTROABS 2.4 01/22/2021    CMP  Lab Results  Component Value Date   NA 136 01/22/2021   K 3.1 (L) 01/22/2021   CL 102 01/22/2021   CO2 27 01/22/2021   GLUCOSE 102 (H) 01/22/2021   BUN 29 (H) 01/22/2021   CREATININE 1.91 (H) 01/22/2021   CALCIUM 8.9 01/22/2021   PROT 6.5 01/22/2021   ALBUMIN 3.6 01/22/2021   AST 18 01/22/2021   ALT 16 01/22/2021   ALKPHOS 54 01/22/2021   BILITOT 0.6 01/22/2021   GFRNONAA 37 (L) 01/22/2021    Lab Results  Component Value Date   CEA1 5.53 (H)  11/17/2020     Medications: I have reviewed the patient's current medications.   Assessment/Plan: 1. Rectal cancer ? Rectal nodularity noted on colonoscopy 06/29/2020, removal of a rectal "polyp"revealed a tubulovillous adenoma with high-grade dysplasia ? Physical exam 08/04/2020-perianal mass invading the anal canal and sphincter complex-biopsy positive for mucinous adenocarcinoma ? CTs 08/17/2020-no rectal mass identified, multiple liver cyst and too small to characterize low-attenuation liver lesions, midline ventral hernia containing proximal right colon, groundglass left upper lobe nodule and nonspecific 3 mm right middle lobe nodule ? Cycle1 FOLFOX 09/15/2020 ? MRI pelvis at [facility] 09/17/2020-anal and low rectal mass. Internal and external sphincters involved by the mass. Subtle extension of the tumor through the serosa of the low rectum. No local regional adenopathy. ? MRI liver at [facility] 09/17/2020-scattered hepatic cysts. No evidence of metastatic disease. ? Cycle 2 FOLFOX 09/29/2020 ? Cycle 3 FOLFOX 10/13/2020 ? Cycle 4 FOLFOX 10/27/2020-Udenyca ? Cycle 5 FOLFOX 11/19/2020-Udenyca ? Cycle 6 FOLFOX 12/02/2020-Udenyca ? Cycle 7 FOLFOX 12/15/2020-Udenyca ? Cycle 8 FOLFOX 12/29/2020 -oxaliplatin, 5-FU bolus, and Udenyca held ? Radiation/Xeloda 01/11/2021 2. Renal insufficiency 3. 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 Hypertension 5. History of kidney stones 6. Sleep apnea 7. Umbilical hernia repair 8. Multiple polyps removed on colonoscopy 06/29/2020 including a benign serrated polyp, tubular adenomas, and a tubulovillous adenoma  with high-grade dysplasia at the rectum 9. Hospital admission 11/08/2020-E. 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 Bink has rectal cancer.  He is  completing neoadjuvant Xeloda and radiation.  He has skin changes secondary to Xeloda and radiation.  I recommended he use a moisturizer on the feet and a barrier cream at the penis and perineum.  He has developed progressive oxaliplatin neuropathy.  He will begin a trial of Cymbalta.  I encouraged him to use Imodium more frequently.  Mr. Mittman will return for an office visit on 02/17/2021.  We will follow-up on the electrolyte panel from today.  Betsy Coder, MD  02/05/2021  10:13 AM

## 2021-02-05 NOTE — Patient Instructions (Signed)

## 2021-02-05 NOTE — Telephone Encounter (Signed)
-----   Message from Ladell Pier, MD sent at 02/05/2021  3:50 PM EDT ----- Please call patient, magnesium and potassium are low, start magnesium oxide 400 mg twice daily and increase potassium chloride to 3 times daily, repeat chemistry panel and magnesium at next visit, push fluids

## 2021-02-06 ENCOUNTER — Other Ambulatory Visit (HOSPITAL_COMMUNITY): Payer: Self-pay

## 2021-02-07 ENCOUNTER — Other Ambulatory Visit (HOSPITAL_COMMUNITY): Payer: Self-pay

## 2021-02-08 ENCOUNTER — Other Ambulatory Visit: Payer: Self-pay

## 2021-02-08 ENCOUNTER — Ambulatory Visit
Admission: RE | Admit: 2021-02-08 | Discharge: 2021-02-08 | Disposition: A | Discharge status: Home / Self Care | Payer: Medicare Other | Source: Ambulatory Visit | Attending: Radiation Oncology | Admitting: Radiation Oncology

## 2021-02-08 DIAGNOSIS — Z510 Encounter for antineoplastic radiation therapy: Secondary | ICD-10-CM | POA: Diagnosis not present

## 2021-02-08 LAB — CEA (IN HOUSE-CHCC): CEA (CHCC-In House): 4.32 ng/mL (ref 0.00–5.00)

## 2021-02-09 ENCOUNTER — Ambulatory Visit
Admission: RE | Admit: 2021-02-09 | Discharge: 2021-02-09 | Disposition: A | Discharge status: Home / Self Care | Payer: Medicare Other | Source: Ambulatory Visit | Attending: Radiation Oncology | Admitting: Radiation Oncology

## 2021-02-09 DIAGNOSIS — Z510 Encounter for antineoplastic radiation therapy: Secondary | ICD-10-CM | POA: Diagnosis not present

## 2021-02-10 ENCOUNTER — Other Ambulatory Visit: Payer: Self-pay

## 2021-02-10 ENCOUNTER — Ambulatory Visit
Admission: RE | Admit: 2021-02-10 | Discharge: 2021-02-10 | Disposition: A | Discharge status: Home / Self Care | Payer: Medicare Other | Source: Ambulatory Visit | Attending: Radiation Oncology | Admitting: Radiation Oncology

## 2021-02-10 DIAGNOSIS — Z510 Encounter for antineoplastic radiation therapy: Secondary | ICD-10-CM | POA: Diagnosis not present

## 2021-02-11 ENCOUNTER — Ambulatory Visit
Admission: RE | Admit: 2021-02-11 | Discharge: 2021-02-11 | Disposition: A | Discharge status: Home / Self Care | Payer: Medicare Other | Source: Ambulatory Visit | Attending: Radiation Oncology | Admitting: Radiation Oncology

## 2021-02-11 DIAGNOSIS — Z510 Encounter for antineoplastic radiation therapy: Secondary | ICD-10-CM | POA: Diagnosis not present

## 2021-02-12 ENCOUNTER — Ambulatory Visit
Admission: RE | Admit: 2021-02-12 | Discharge: 2021-02-12 | Disposition: A | Discharge status: Home / Self Care | Payer: Medicare Other | Source: Ambulatory Visit | Attending: Radiation Oncology | Admitting: Radiation Oncology

## 2021-02-12 DIAGNOSIS — Z510 Encounter for antineoplastic radiation therapy: Secondary | ICD-10-CM | POA: Diagnosis not present

## 2021-02-16 ENCOUNTER — Ambulatory Visit
Admission: RE | Admit: 2021-02-16 | Discharge: 2021-02-16 | Disposition: A | Discharge status: Home / Self Care | Payer: Medicare Other | Source: Ambulatory Visit | Attending: Radiation Oncology | Admitting: Radiation Oncology

## 2021-02-16 DIAGNOSIS — Z510 Encounter for antineoplastic radiation therapy: Secondary | ICD-10-CM | POA: Diagnosis not present

## 2021-02-17 ENCOUNTER — Inpatient Hospital Stay: Payer: Medicare Other

## 2021-02-17 ENCOUNTER — Encounter: Payer: Self-pay | Admitting: Nurse Practitioner

## 2021-02-17 ENCOUNTER — Inpatient Hospital Stay (HOSPITAL_BASED_OUTPATIENT_CLINIC_OR_DEPARTMENT_OTHER): Payer: Medicare Other | Admitting: Nurse Practitioner

## 2021-02-17 ENCOUNTER — Other Ambulatory Visit: Payer: Self-pay

## 2021-02-17 ENCOUNTER — Ambulatory Visit
Admission: RE | Admit: 2021-02-17 | Discharge: 2021-02-17 | Disposition: A | Discharge status: Home / Self Care | Payer: Medicare Other | Source: Ambulatory Visit | Attending: Radiation Oncology | Admitting: Radiation Oncology

## 2021-02-17 ENCOUNTER — Telehealth: Payer: Self-pay

## 2021-02-17 VITALS — BP 112/64 | HR 71 | Temp 97.6°F | Resp 18

## 2021-02-17 DIAGNOSIS — Z9221 Personal history of antineoplastic chemotherapy: Secondary | ICD-10-CM | POA: Insufficient documentation

## 2021-02-17 DIAGNOSIS — G620 Drug-induced polyneuropathy: Secondary | ICD-10-CM | POA: Insufficient documentation

## 2021-02-17 DIAGNOSIS — C20 Malignant neoplasm of rectum: Secondary | ICD-10-CM | POA: Insufficient documentation

## 2021-02-17 DIAGNOSIS — Z79899 Other long term (current) drug therapy: Secondary | ICD-10-CM | POA: Insufficient documentation

## 2021-02-17 DIAGNOSIS — Z923 Personal history of irradiation: Secondary | ICD-10-CM | POA: Diagnosis not present

## 2021-02-17 DIAGNOSIS — I10 Essential (primary) hypertension: Secondary | ICD-10-CM | POA: Insufficient documentation

## 2021-02-17 DIAGNOSIS — Z452 Encounter for adjustment and management of vascular access device: Secondary | ICD-10-CM | POA: Diagnosis not present

## 2021-02-17 DIAGNOSIS — G4730 Sleep apnea, unspecified: Secondary | ICD-10-CM | POA: Insufficient documentation

## 2021-02-17 DIAGNOSIS — Z510 Encounter for antineoplastic radiation therapy: Secondary | ICD-10-CM | POA: Diagnosis present

## 2021-02-17 DIAGNOSIS — Z8547 Personal history of malignant neoplasm of testis: Secondary | ICD-10-CM | POA: Insufficient documentation

## 2021-02-17 DIAGNOSIS — N2889 Other specified disorders of kidney and ureter: Secondary | ICD-10-CM | POA: Insufficient documentation

## 2021-02-17 LAB — CBC WITH DIFFERENTIAL (CANCER CENTER ONLY)
Abs Immature Granulocytes: 0.04 10*3/uL (ref 0.00–0.07)
Basophils Absolute: 0 10*3/uL (ref 0.0–0.1)
Basophils Relative: 0 %
Eosinophils Absolute: 0 10*3/uL (ref 0.0–0.5)
Eosinophils Relative: 0 %
HCT: 29.2 % — ABNORMAL LOW (ref 39.0–52.0)
Hemoglobin: 10.1 g/dL — ABNORMAL LOW (ref 13.0–17.0)
Immature Granulocytes: 1 %
Lymphocytes Relative: 2 %
Lymphs Abs: 0.1 10*3/uL — ABNORMAL LOW (ref 0.7–4.0)
MCH: 32 pg (ref 26.0–34.0)
MCHC: 34.6 g/dL (ref 30.0–36.0)
MCV: 92.4 fL (ref 80.0–100.0)
Monocytes Absolute: 1.3 10*3/uL — ABNORMAL HIGH (ref 0.1–1.0)
Monocytes Relative: 21 %
Neutro Abs: 4.8 10*3/uL (ref 1.7–7.7)
Neutrophils Relative %: 76 %
Platelet Count: 363 10*3/uL (ref 150–400)
RBC: 3.16 MIL/uL — ABNORMAL LOW (ref 4.22–5.81)
RDW: 17.4 % — ABNORMAL HIGH (ref 11.5–15.5)
WBC Count: 6.3 10*3/uL (ref 4.0–10.5)
nRBC: 0 % (ref 0.0–0.2)

## 2021-02-17 LAB — CMP (CANCER CENTER ONLY)
ALT: 12 U/L (ref 0–44)
AST: 16 U/L (ref 15–41)
Albumin: 3.6 g/dL (ref 3.5–5.0)
Alkaline Phosphatase: 64 U/L (ref 38–126)
Anion gap: 14 (ref 5–15)
BUN: 34 mg/dL — ABNORMAL HIGH (ref 8–23)
CO2: 25 mmol/L (ref 22–32)
Calcium: 9.3 mg/dL (ref 8.9–10.3)
Chloride: 93 mmol/L — ABNORMAL LOW (ref 98–111)
Creatinine: 2.61 mg/dL — ABNORMAL HIGH (ref 0.61–1.24)
GFR, Estimated: 26 mL/min — ABNORMAL LOW (ref >60)
Glucose, Bld: 129 mg/dL — ABNORMAL HIGH (ref 70–99)
Potassium: 2.9 mmol/L — ABNORMAL LOW (ref 3.5–5.1)
Sodium: 132 mmol/L — ABNORMAL LOW (ref 135–145)
Total Bilirubin: 0.5 mg/dL (ref 0.3–1.2)
Total Protein: 6.7 g/dL (ref 6.5–8.1)

## 2021-02-17 NOTE — Progress Notes (Signed)
  [place] OFFICE PROGRESS NOTE   Diagnosis: Rectal cancer  INTERVAL HISTORY:   Walter [name] returns as scheduled.  He continues Xeloda and radiation.  He is scheduled to complete the course of radiation 02/18/2021.  He has some nausea.  No vomiting.  He has noticed a mouth sore.  He continues to have diarrhea, 4-5 loose stools a day.  Imodium helps.  He notes burning/tingling on the palms.  Objective:  Vital signs in last 24 hours:  Blood pressure 112/64, pulse 71, temperature 97.6 F (36.4 C), temperature source Temporal, resp. rate 18, SpO2 100 %.    HEENT: Erythema/ulceration left posterior palate and right lower inner lip. Resp: Lungs clear bilaterally. Cardio: Regular rate and rhythm. GI: Abdomen soft and nontender.  No hepatomegaly. Vascular: No leg edema. Skin: Palms with dryness, hyperpigmentation, skin thickening.  Superficial ulceration at the head of the penis, perineum, gluteal fold. Port-A-Cath without erythema.  Lab Results:  Lab Results  Component Value Date   WBC 6.3 02/17/2021   HGB 10.1 (L) 02/17/2021   HCT 29.2 (L) 02/17/2021   MCV 92.4 02/17/2021   PLT 363 02/17/2021   NEUTROABS 4.8 02/17/2021    Imaging:  No results found.  Medications: I have reviewed the patient's current medications.  Assessment/Plan: 1. Rectal cancer ? Rectal nodularity noted on colonoscopy 06/29/2020, removal of a rectal "polyp"revealed a tubulovillous adenoma with high-grade dysplasia ? Physical exam 08/04/2020-perianal mass invading the anal canal and sphincter complex-biopsy positive for mucinous adenocarcinoma ? CTs 08/17/2020-no rectal mass identified, multiple liver cyst and too small to characterize low-attenuation liver lesions, midline ventral hernia containing proximal right colon, groundglass left upper lobe nodule and nonspecific 3 mm right middle lobe nodule ? Cycle1 FOLFOX 09/15/2020 ? MRI pelvis [facility] 09/17/2020-anal and low rectal mass.  Internal and external sphincters involved by the mass. Subtle extension of the tumor through the serosa of the low rectum. No local regional adenopathy. ? MRI liver at [facility] 09/17/2020-scattered hepatic cysts. No evidence of metastatic disease. ? Cycle 2 FOLFOX 09/29/2020 ? Cycle 3 FOLFOX 10/13/2020 ? Cycle 4 FOLFOX 10/27/2020-Udenyca ? Cycle 5 FOLFOX 11/19/2020-Udenyca ? Cycle 6 FOLFOX 12/02/2020-Udenyca ? Cycle 7 FOLFOX 12/15/2020-Udenyca ? Cycle 8 FOLFOX 12/29/2020 -oxaliplatin, 5-FU bolus, and Udenyca held ? Radiation/Xeloda 01/11/2021-02/18/2021 2. Renal insufficiency 3. 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 Hypertension 5. History of kidney stones 6. Sleep apnea 7. Umbilical hernia repair 8. 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 Hospital admission 11/08/2020-E. 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 Zawadzki is completing neoadjuvant Xeloda and radiation, last radiation treatment 02/18/2021.  He has skin changes related to Xeloda and radiation.  He will continue supportive measures.  He understands the skin should begin to heal over the next few weeks.  He has follow-up with Dr. Morton Stall 03/25/2021.  He will return for follow-up here with labs and a Port-A-Cath flush in 4 weeks.  We are available to see him sooner if needed.  Plan reviewed with Dr. Benay Spice.    Ned Card ANP/GNP-BC   02/17/2021  10:44 AM

## 2021-02-17 NOTE — Telephone Encounter (Signed)
Per provider pt called no answered message left inquired about potassium dose and compliance potassium resulted low. Also sent a staff message to radiation to eval pt tomorrow for IV fluid need  Pt encouraged to return call to receive message in detail and for any concerns changes or questions

## 2021-02-18 ENCOUNTER — Encounter: Payer: Self-pay | Admitting: Radiation Oncology

## 2021-02-18 ENCOUNTER — Other Ambulatory Visit: Payer: Self-pay

## 2021-02-18 ENCOUNTER — Other Ambulatory Visit: Payer: Self-pay | Admitting: Nurse Practitioner

## 2021-02-18 ENCOUNTER — Other Ambulatory Visit: Payer: Self-pay | Admitting: Radiation Oncology

## 2021-02-18 ENCOUNTER — Ambulatory Visit
Admission: RE | Admit: 2021-02-18 | Discharge: 2021-02-18 | Disposition: A | Discharge status: Home / Self Care | Payer: Medicare Other | Source: Ambulatory Visit | Attending: Radiation Oncology | Admitting: Radiation Oncology

## 2021-02-18 DIAGNOSIS — C20 Malignant neoplasm of rectum: Secondary | ICD-10-CM

## 2021-02-18 DIAGNOSIS — Z510 Encounter for antineoplastic radiation therapy: Secondary | ICD-10-CM | POA: Diagnosis not present

## 2021-02-18 MED ORDER — DIPHENOXYLATE-ATROPINE 2.5-0.025 MG PO TABS: 2.0000 | ORAL_TABLET | Freq: Four times a day (QID) | ORAL | 0 refills | Status: DC | PRN

## 2021-02-18 MED ORDER — SODIUM CHLORIDE 0.9 % IV SOLN
INTRAVENOUS | Status: DC
  Filled 2021-02-18: qty 250

## 2021-02-18 MED ORDER — POTASSIUM CHLORIDE 10 MEQ/100ML IV SOLN
10.0000 meq | INTRAVENOUS | Status: AC
  Filled 2021-02-18: qty 100

## 2021-02-19 ENCOUNTER — Ambulatory Visit: Payer: Medicare Other

## 2021-02-22 ENCOUNTER — Other Ambulatory Visit: Payer: Self-pay | Admitting: Radiation Oncology

## 2021-02-22 ENCOUNTER — Other Ambulatory Visit: Payer: Self-pay

## 2021-02-22 ENCOUNTER — Other Ambulatory Visit: Payer: Self-pay | Admitting: Nurse Practitioner

## 2021-02-22 ENCOUNTER — Ambulatory Visit: Payer: Medicare Other

## 2021-02-22 ENCOUNTER — Inpatient Hospital Stay: Payer: Medicare Other

## 2021-02-22 VITALS — BP 113/62 | HR 96 | Resp 98 | Wt 174.4 lb

## 2021-02-22 DIAGNOSIS — Z95828 Presence of other vascular implants and grafts: Secondary | ICD-10-CM

## 2021-02-22 DIAGNOSIS — E860 Dehydration: Secondary | ICD-10-CM

## 2021-02-22 DIAGNOSIS — C20 Malignant neoplasm of rectum: Secondary | ICD-10-CM

## 2021-02-22 DIAGNOSIS — Z510 Encounter for antineoplastic radiation therapy: Secondary | ICD-10-CM | POA: Diagnosis not present

## 2021-02-22 LAB — BASIC METABOLIC PANEL
Anion gap: 14 (ref 5–15)
BUN: 25 mg/dL — ABNORMAL HIGH (ref 8–23)
CO2: 27 mmol/L (ref 22–32)
Calcium: 9.4 mg/dL (ref 8.9–10.3)
Chloride: 88 mmol/L — ABNORMAL LOW (ref 98–111)
Creatinine, Ser: 2.09 mg/dL — ABNORMAL HIGH (ref 0.61–1.24)
GFR, Estimated: 33 mL/min — ABNORMAL LOW (ref >60)
Glucose, Bld: 87 mg/dL (ref 70–99)
Potassium: 2.9 mmol/L — ABNORMAL LOW (ref 3.5–5.1)
Sodium: 129 mmol/L — ABNORMAL LOW (ref 135–145)

## 2021-02-22 MED ORDER — POTASSIUM CHLORIDE 10 MEQ/100ML IV SOLN
10.0000 meq | INTRAVENOUS | Status: DC
  Filled 2021-02-22: qty 100

## 2021-02-22 MED ORDER — POTASSIUM CHLORIDE 10 MEQ/100ML IV SOLN
10.0000 meq | INTRAVENOUS | Status: AC
  Administered 2021-02-22 (×2): 10 meq via INTRAVENOUS
  Filled 2021-02-22: qty 100

## 2021-02-22 MED ORDER — SODIUM CHLORIDE 0.9% FLUSH
10.0000 mL | Freq: Once | INTRAVENOUS | Status: AC
  Administered 2021-02-22: 10 mL
  Filled 2021-02-22: qty 10

## 2021-02-22 MED ORDER — SODIUM CHLORIDE 0.9 % IV SOLN
Freq: Once | INTRAVENOUS | Status: AC
  Filled 2021-02-22: qty 250

## 2021-02-22 MED ORDER — SODIUM CHLORIDE 0.9 % IV SOLN
INTRAVENOUS | Status: AC
  Filled 2021-02-22: qty 250

## 2021-02-22 MED ORDER — HEPARIN SOD (PORK) LOCK FLUSH 100 UNIT/ML IV SOLN
500.0000 [IU] | Freq: Once | INTRAVENOUS | Status: AC
Start: 2021-02-22 — End: 2021-02-22
  Administered 2021-02-22: 500 [IU]
  Filled 2021-02-22: qty 5

## 2021-02-22 NOTE — Patient Instructions (Signed)
Potassium acetate injection What is this medicine? POTASSIUM ACETATE (poe TASS i um ASa tate) is a potassium supplement used to prevent and to treat low potassium. Potassium is important for the heart, muscles, and nerves. Too much or too little potassium in the body can cause serious problems. This medicine may be used for other purposes; ask your health care provider or pharmacist if you have questions. What should I tell my health care provider before I take this medicine? They need to know if you have any of these conditions:  Addison's disease  dehydration  diabetes  heart disease  high levels of potassium in the blood  irregular heartbeat  kidney disease  liver disease  recent severe burn  an unusual or allergic reaction to potassium, other medicines, foods, dyes, or preservatives  pregnant or trying to get pregnant  breast-feeding How should I use this medicine? This medicine is for infusion into a vein. It is given by a health care professional in a hospital or clinic setting. Talk to your pediatrician regarding the use of this medicine in children. Special care may be needed. Overdosage: If you think you have taken too much of this medicine contact a poison control center or emergency room at once. NOTE: This medicine is only for you. Do not share this medicine with others. What if I miss a dose? This does not apply. What may interact with this medicine? Do not take this medicine with any of the following medications:  certain diuretics such as spironolactone, triamterene  eplerenone  sodium polystyrene sulfonate This medicine may also interact with the following medications:  certain medicines for blood pressure or heart disease like lisinopril, losartan, quinapril, valsartan  medicines that lower your chance of fighting infection such as cyclosporine, tacrolimus  NSAIDs, medicines for pain and inflammation, like ibuprofen or naproxen  other potassium  supplements  salt substitutes This list may not describe all possible interactions. Give your health care provider a list of all the medicines, herbs, non-prescription drugs, or dietary supplements you use. Also tell them if you smoke, drink alcohol, or use illegal drugs. Some items may interact with your medicine. What should I watch for while using this medicine? Your condition will be monitored carefully while you are receiving this medicine. You may need blood work done while you are taking this medicine. What side effects may I notice from receiving this medicine? Side effects that you should report to your doctor or health care professional as soon as possible:  allergic reactions like skin rash, itching or hives, swelling of the face, lips, or tongue  breathing problems  confusion  fast, irregular heartbeat  feeling faint or lightheaded, falls  low blood pressure  numbness or tingling in hands or feet  pain, redness, or irritation at site where injected  unusually weak or tired  weakness, heaviness of legs Side effects that usually do not require medical attention (report these to your doctor or health care professional if they continue or are bothersome):  diarrhea  nausea, vomiting  stomach pain This list may not describe all possible side effects. Call your doctor for medical advice about side effects. You may report side effects to FDA at 1-800-FDA-1088. Where should I keep my medicine? This drug is given in a hospital or clinic and will not be stored at home. NOTE: This sheet is a summary. It may not cover all possible information. If you have questions about this medicine, talk to your doctor, pharmacist, or health care provider.    2021 Elsevier/Gold Standard (2016-06-08 09:51:49)  

## 2021-02-23 ENCOUNTER — Encounter: Payer: Self-pay | Admitting: Oncology

## 2021-02-23 NOTE — Progress Notes (Signed)
  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 He did have a hard time with appetite and need IV hydration toward the end of treatment.  Plan: The patient will receive a call in about one month from the radiation oncology department. He will continue follow up with Dr. Benay Spice as well as Dr. Morton Stall. ________________________________________________    Carola Rhine, PAC

## 2021-02-24 ENCOUNTER — Other Ambulatory Visit: Payer: Self-pay

## 2021-02-24 ENCOUNTER — Emergency Department (HOSPITAL_BASED_OUTPATIENT_CLINIC_OR_DEPARTMENT_OTHER): Payer: Medicare Other | Admitting: Radiology

## 2021-02-24 ENCOUNTER — Inpatient Hospital Stay: Payer: Medicare Other

## 2021-02-24 ENCOUNTER — Encounter (HOSPITAL_BASED_OUTPATIENT_CLINIC_OR_DEPARTMENT_OTHER): Payer: Self-pay | Admitting: *Deleted

## 2021-02-24 ENCOUNTER — Telehealth: Payer: Self-pay

## 2021-02-24 ENCOUNTER — Emergency Department (HOSPITAL_BASED_OUTPATIENT_CLINIC_OR_DEPARTMENT_OTHER): Payer: Medicare Other

## 2021-02-24 ENCOUNTER — Emergency Department (HOSPITAL_BASED_OUTPATIENT_CLINIC_OR_DEPARTMENT_OTHER)
Admission: EM | Admit: 2021-02-24 | Discharge: 2021-02-24 | Disposition: A | Discharge status: Home / Self Care | Payer: Medicare Other | Attending: Emergency Medicine | Admitting: Emergency Medicine

## 2021-02-24 DIAGNOSIS — Z79899 Other long term (current) drug therapy: Secondary | ICD-10-CM | POA: Insufficient documentation

## 2021-02-24 DIAGNOSIS — I129 Hypertensive chronic kidney disease with stage 1 through stage 4 chronic kidney disease, or unspecified chronic kidney disease: Secondary | ICD-10-CM | POA: Insufficient documentation

## 2021-02-24 DIAGNOSIS — R42 Dizziness and giddiness: Secondary | ICD-10-CM | POA: Diagnosis not present

## 2021-02-24 DIAGNOSIS — Z8547 Personal history of malignant neoplasm of testis: Secondary | ICD-10-CM | POA: Insufficient documentation

## 2021-02-24 DIAGNOSIS — R531 Weakness: Secondary | ICD-10-CM | POA: Insufficient documentation

## 2021-02-24 DIAGNOSIS — Z85048 Personal history of other malignant neoplasm of rectum, rectosigmoid junction, and anus: Secondary | ICD-10-CM | POA: Diagnosis not present

## 2021-02-24 DIAGNOSIS — Z7982 Long term (current) use of aspirin: Secondary | ICD-10-CM | POA: Insufficient documentation

## 2021-02-24 DIAGNOSIS — N189 Chronic kidney disease, unspecified: Secondary | ICD-10-CM | POA: Insufficient documentation

## 2021-02-24 LAB — COMPREHENSIVE METABOLIC PANEL
ALT: 12 U/L (ref 0–44)
AST: 23 U/L (ref 15–41)
Albumin: 3.2 g/dL — ABNORMAL LOW (ref 3.5–5.0)
Alkaline Phosphatase: 73 U/L (ref 38–126)
Anion gap: 12 (ref 5–15)
BUN: 18 mg/dL (ref 8–23)
CO2: 28 mmol/L (ref 22–32)
Calcium: 9.4 mg/dL (ref 8.9–10.3)
Chloride: 93 mmol/L — ABNORMAL LOW (ref 98–111)
Creatinine, Ser: 1.98 mg/dL — ABNORMAL HIGH (ref 0.61–1.24)
GFR, Estimated: 36 mL/min — ABNORMAL LOW (ref >60)
Glucose, Bld: 125 mg/dL — ABNORMAL HIGH (ref 70–99)
Potassium: 3.1 mmol/L — ABNORMAL LOW (ref 3.5–5.1)
Sodium: 133 mmol/L — ABNORMAL LOW (ref 135–145)
Total Bilirubin: 0.4 mg/dL (ref 0.3–1.2)
Total Protein: 6.4 g/dL — ABNORMAL LOW (ref 6.5–8.1)

## 2021-02-24 LAB — URINALYSIS, ROUTINE W REFLEX MICROSCOPIC
Bilirubin Urine: NEGATIVE
Glucose, UA: NEGATIVE mg/dL
Ketones, ur: NEGATIVE mg/dL
Nitrite: NEGATIVE
Specific Gravity, Urine: 1.017 (ref 1.005–1.030)
pH: 5.5 (ref 5.0–8.0)

## 2021-02-24 LAB — CBC WITH DIFFERENTIAL/PLATELET
Abs Immature Granulocytes: 0.07 10*3/uL (ref 0.00–0.07)
Basophils Absolute: 0 10*3/uL (ref 0.0–0.1)
Basophils Relative: 1 %
Eosinophils Absolute: 0.1 10*3/uL (ref 0.0–0.5)
Eosinophils Relative: 1 %
HCT: 32.3 % — ABNORMAL LOW (ref 39.0–52.0)
Hemoglobin: 10.9 g/dL — ABNORMAL LOW (ref 13.0–17.0)
Immature Granulocytes: 1 %
Lymphocytes Relative: 8 %
Lymphs Abs: 0.4 10*3/uL — ABNORMAL LOW (ref 0.7–4.0)
MCH: 31 pg (ref 26.0–34.0)
MCHC: 33.7 g/dL (ref 30.0–36.0)
MCV: 91.8 fL (ref 80.0–100.0)
Monocytes Absolute: 1.5 10*3/uL — ABNORMAL HIGH (ref 0.1–1.0)
Monocytes Relative: 27 %
Neutro Abs: 3.4 10*3/uL (ref 1.7–7.7)
Neutrophils Relative %: 62 %
Platelets: 341 10*3/uL (ref 150–400)
RBC: 3.52 MIL/uL — ABNORMAL LOW (ref 4.22–5.81)
RDW: 17.3 % — ABNORMAL HIGH (ref 11.5–15.5)
WBC: 5.5 10*3/uL (ref 4.0–10.5)
nRBC: 0 % (ref 0.0–0.2)

## 2021-02-24 LAB — LIPASE, BLOOD: Lipase: 12 U/L (ref 11–51)

## 2021-02-24 MED ORDER — POTASSIUM CHLORIDE CRYS ER 20 MEQ PO TBCR
40.0000 meq | EXTENDED_RELEASE_TABLET | Freq: Once | ORAL | Status: AC
  Administered 2021-02-24: 40 meq via ORAL
  Filled 2021-02-24: qty 2

## 2021-02-24 MED ORDER — SODIUM CHLORIDE 0.9 % IV BOLUS
1000.0000 mL | Freq: Once | INTRAVENOUS | Status: AC
  Administered 2021-02-24: 1000 mL via INTRAVENOUS

## 2021-02-24 MED ORDER — SODIUM CHLORIDE 0.9 % IV BOLUS
500.0000 mL | Freq: Once | INTRAVENOUS | Status: AC
  Administered 2021-02-24: 500 mL via INTRAVENOUS

## 2021-02-24 NOTE — ED Triage Notes (Addendum)
Diarrhea x 5 week and weakness.  Pt is a cancer and has just completed his chemo and radiation treatment.  This morning when he got up to go to the bathroom, he was dizzy and fell.

## 2021-02-24 NOTE — Telephone Encounter (Addendum)
TC from Pt's wife stating Pt has burning when urinating and has fell and hit his head this morning. Informed Pt's wife that Pt had an appointment today which  Pt's complaints could have been taken care of. Discussed situation with Leander Rams NP recommends Pt go to the hospital to be evaluated for the fall. Pt's wife verbalized understanding.

## 2021-02-24 NOTE — ED Provider Notes (Signed)
[facility]ER[facility] DEPT Provider Note   CSN: [id number] Arrival date & time: 02/24/21  1345     History Chief Complaint  Patient presents with  . Weakness    Walter [name] is a 70 y.o. male.  Patient here with generalized weakness.  History of rectal cancer just finished chemotherapy.  States that he had a bad course of diarrhea for several weeks that is now resolved.  He states that when he stood up today felt a little lightheaded and fell to the floor hit his head.  He did not lose consciousness.  Concern for dehydration.  The history is provided by the patient.  Weakness Severity:  Mild Onset quality:  Gradual Timing:  Intermittent Chronicity:  New Context: dehydration   Relieved by:  Nothing Worsened by:  Nothing Associated symptoms: no abdominal pain, no arthralgias, no chest pain, no cough, no dysuria, no fever, no lethargy, no nausea, no seizures, no shortness of breath and no vomiting        Past Medical History:  Diagnosis Date  . Hypertension   . Rectal cancer ([place]) dx'd 07/2020  . testicular ca    XRT comp    Patient Active Problem List   Diagnosis Date Noted  . Leukocytosis   . Tachycardia   . Sepsis due to urinary tract infection ([place]) 11/08/2020  . Hypertension   . CKD (chronic kidney disease)   . Port-A-Cath in place 10/13/2020  . 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 IR IMAGING GUIDED PORT INSERTION  09/08/2020  . RECTAL BIOPSY  2021       Family History  Problem Relation Age of Onset  . Hypertension Father   . Cancer Brother     Social History   Tobacco Use  . Smoking status: Never Smoker  . Smokeless tobacco: Never Used  Substance Use Topics  . Alcohol use: Never  . Drug use: Not Currently    Home Medications Prior to Admission medications   Medication Sig Start Date End Date Taking? Authorizing Provider  amLODipine (NORVASC) 10 MG tablet Take 10  mg by mouth daily. 07/09/20  Yes [provider]  chlorthalidone (HYGROTON) 25 MG tablet Take 25 mg by mouth daily. 07/10/20  Yes [provider]  Cholecalciferol (D3-1000 PO) Take 1 tablet by mouth daily.   Yes [provider]  Cyanocobalamin (VITAMIN B-12) 5000 MCG SUBL Place 5,000 mcg under the tongue 2 (two) times daily.   Yes [provider]  DULoxetine (CYMBALTA) 60 MG capsule Take 1 capsule (60 mg total) by mouth daily. 02/05/21  Yes Ladell Pier, MD  Multiple Vitamin (MULTIVITAMIN WITH MINERALS) TABS tablet Take 1 tablet by mouth every Monday.   Yes [provider]  potassium chloride SA (KLOR-CON) 20 MEQ tablet Take 1 tablet (20 mEq total) by mouth 2 (two) times daily. 01/04/21  Yes Ladell Pier, MD  acetaminophen (TYLENOL) 500 MG tablet Take 500 mg by mouth 2 (two) times daily.    [provider]  aspirin 81 MG EC tablet Take 81 mg by mouth every Monday.    [provider]  capecitabine (XELODA) 500 MG tablet Take 3 tablets (1500 mg) by mouth in AM and 2 tablets (1000 mg) by mouth in PM (total dose 2500 mg daily). Take within 30 minutes after meals. Take on days of radiation only. 01/11/21   Ladell Pier, MD  diphenoxylate-atropine (LOMOTIL) 2.5-0.025 MG tablet Take 2  tablets by mouth 4 (four) times daily as needed for diarrhea or loose stools. 02/18/21   Kyung Rudd, MD  DULoxetine (CYMBALTA) 30 MG capsule Take 1 capsule (30 mg total) by mouth daily. Take 30 mg daily for 1 week 02/05/21   Ladell Pier, MD  lidocaine-prilocaine (EMLA) cream Apply to port site 1-2 hours prior to use 12/02/20   Owens Shark, NP  magnesium oxide (MAG-OX) 400 (240 Mg) MG tablet Take 1 tablet (400 mg total) by mouth 2 (two) times daily. 02/05/21   Ladell Pier, MD  prochlorperazine (COMPAZINE) 10 MG tablet Take 1 tablet (10 mg total) by mouth every 6 (six) hours as needed for nausea. 12/15/20   Owens Shark, NP    Allergies     Patient has no known allergies.  Review of Systems   Review of Systems  Constitutional: Negative for chills and fever.  HENT: Negative for ear pain and sore throat.   Eyes: Negative for pain and visual disturbance.  Respiratory: Negative for cough and shortness of breath.   Cardiovascular: Negative for chest pain and palpitations.  Gastrointestinal: Negative for abdominal pain, nausea and vomiting.  Genitourinary: Negative for dysuria and hematuria.  Musculoskeletal: Negative for arthralgias and back pain.  Skin: Negative for color change and rash.  Neurological: Positive for weakness. Negative for seizures and syncope.  All other systems reviewed and are negative.   Physical Exam Updated Vital Signs  ED Triage Vitals  Enc Vitals Group     BP 02/24/21 1400 115/70     Pulse Rate 02/24/21 1400 100     Resp 02/24/21 1400 16     Temp 02/24/21 1400 98.3 F (36.8 C)     Temp Source 02/24/21 1400 Oral     SpO2 02/24/21 1400 100 %     Weight 02/24/21 1358 165 lb (74.8 kg)     Height 02/24/21 1358 5\' 8"  (1.727 m)     Head Circumference --      Peak Flow --      Pain Score 02/24/21 1357 0     Pain Loc --      Pain Edu? --      Excl. in [place]? --     Physical Exam Vitals and nursing note reviewed.  Constitutional:      General: He is not in acute distress.    Appearance: He is well-developed. He is not ill-appearing.  HENT:     Head: Normocephalic and atraumatic.     Mouth/Throat:     Mouth: Mucous membranes are dry.  Eyes:     Extraocular Movements: Extraocular movements intact.     Conjunctiva/sclera: Conjunctivae normal.     Pupils: Pupils are equal, round, and reactive to light.  Cardiovascular:     Rate and Rhythm: Normal rate and regular rhythm.     Pulses: Normal pulses.     Heart sounds: Normal heart sounds. No murmur heard.   Pulmonary:     Effort: Pulmonary effort is normal. No respiratory distress.     Breath sounds: Normal breath sounds.  Abdominal:      Palpations: Abdomen is soft.     Tenderness: There is no abdominal tenderness.  Musculoskeletal:        General: No tenderness. Normal range of motion.     Cervical back: Normal range of motion and neck supple. No tenderness.  Skin:    General: Skin is warm and dry.     Capillary Refill: Capillary refill  takes less than 2 seconds.  Neurological:     General: No focal deficit present.     Mental Status: He is alert and oriented to person, place, and time.     Cranial Nerves: No cranial nerve deficit.     Sensory: No sensory deficit.     Motor: No weakness.     Coordination: Coordination normal.  Psychiatric:        Mood and Affect: Mood normal.     ED Results / Procedures / Treatments   Labs (all labs ordered are listed, but only abnormal results are displayed) Labs Reviewed  CBC WITH DIFFERENTIAL/PLATELET - Abnormal; Notable for the following components:      Result Value   RBC 3.52 (*)    Hemoglobin 10.9 (*)    HCT 32.3 (*)    RDW 17.3 (*)    Lymphs Abs 0.4 (*)    Monocytes Absolute 1.5 (*)    All other components within normal limits  COMPREHENSIVE METABOLIC PANEL - Abnormal; Notable for the following components:   Sodium 133 (*)    Potassium 3.1 (*)    Chloride 93 (*)    Glucose, Bld 125 (*)    Creatinine, Ser 1.98 (*)    Total Protein 6.4 (*)    Albumin 3.2 (*)    GFR, Estimated 36 (*)    All other components within normal limits  URINALYSIS, ROUTINE W REFLEX MICROSCOPIC - Abnormal; Notable for the following components:   Hgb urine dipstick MODERATE (*)    Protein, ur TRACE (*)    Leukocytes,Ua TRACE (*)    All other components within normal limits  LIPASE, BLOOD    EKG EKG Interpretation  Date/Time:  Wednesday February 24 2021 15:23:00 EDT Ventricular Rate:  95 PR Interval:  200 QRS Duration: 79 QT Interval:  357 QTC Calculation: 449 R Axis:   59 Text Interpretation: Sinus rhythm Abnormal R-wave progression, early transition Confirmed by Lennice Sites  ([id number]) on 02/24/2021 4:09:18 PM   Radiology DG Chest 2 View  Result Date: 02/24/2021 CLINICAL DATA:  Weakness. EXAM: CHEST - 2 VIEW COMPARISON:  Chest x-ray 11/08/2020. FINDINGS: PowerPort catheter noted with tip in stable position. Heart size stable. Mild bibasilar atelectasis and or scarring again noted. Stable elevation left hemidiaphragm. Interposition of the colon on the left hemidiaphragm again noted. No pleural effusion or pneumothorax. Degenerative changes both shoulders. No acute bony abnormality. IMPRESSION: No acute abnormality. Chest is stable from prior exam. PowerPort catheter in stable position. Stable elevation left hemidiaphragm. Electronically Signed   By: Marcello Moores  Register   On: 02/24/2021 17:01   CT Head Wo Contrast  Result Date: 02/24/2021 CLINICAL DATA:  Dizziness and subsequent fall. EXAM: CT HEAD WITHOUT CONTRAST TECHNIQUE: Contiguous axial images were obtained from the base of the skull through the vertex without intravenous contrast. COMPARISON:  None. FINDINGS: Brain: There is mild cerebral atrophy with widening of the extra-axial spaces and ventricular dilatation. There are areas of decreased attenuation within the white matter tracts of the supratentorial brain, consistent with microvascular disease changes. Vascular: No hyperdense vessel or unexpected calcification. Skull: Normal. Negative for fracture or focal lesion. Sinuses/Orbits: No acute finding. Other: None. IMPRESSION: 1. Generalized cerebral atrophy. 2. No acute intracranial abnormality. Electronically Signed   By: Virgina Norfolk M.D.   On: 02/24/2021 15:53   CT Cervical Spine Wo Contrast  Result Date: 02/24/2021 CLINICAL DATA:  Status post fall. EXAM: CT CERVICAL SPINE WITHOUT CONTRAST TECHNIQUE: Multidetector CT imaging of the cervical spine  was performed without intravenous contrast. Multiplanar CT image reconstructions were also generated. COMPARISON:  None. FINDINGS: Alignment: Normal. Skull base and vertebrae:  No acute fracture. No primary bone lesion or focal pathologic process. Soft tissues and spinal canal: No prevertebral fluid or swelling. No visible canal hematoma. Disc levels: Mild to moderate severity endplate sclerosis is seen at the levels of C5-C6 and C6-C7. Mild anterior osteophyte formation is also seen at these levels. There is mild multilevel intervertebral disc space narrowing. Mild, bilateral multilevel facet joint hypertrophy is noted. Upper chest: Negative. Other: None. IMPRESSION: 1. No acute cervical spine fracture or subluxation. 2. Mild to moderate severity degenerative changes, most prominent at the levels of C5-C6 and C6-C7. Electronically Signed   By: Virgina Norfolk M.D.   On: 02/24/2021 15:57    Procedures Procedures   Medications Ordered in ED Medications  sodium chloride 0.9 % bolus 1,000 mL (0 mLs Intravenous Stopped 02/24/21 1725)  potassium chloride SA (KLOR-CON) CR tablet 40 mEq (40 mEq Oral Given 02/24/21 1724)  sodium chloride 0.9 % bolus 500 mL (500 mLs Intravenous New Bag/Given 02/24/21 1724)    ED Course  I have reviewed the triage vital signs and the nursing notes.  Pertinent labs & imaging results that were available during my care of the patient were reviewed by me and considered in my medical decision making (see chart for details).    MDM Rules/Calculators/A&P                          Walter [name] is here with generalized weakness, concern for dehydration.  Had a fall today after feeling dizzy while standing up.  Has had diarrhea for several weeks.  History of rectal cancer just finished chemotherapy.  Normal vitals.  No fever.  No chest pain or shortness of breath or abdominal pain.  Diarrhea has mostly resolved at this time.  He does have some dry mucous membranes on exam and will give fluid bolus.  EKG shows sinus rhythm.  No ischemic changes.  He has had some pain with urination and possibly he could have a UTI.  We will get head CT and neck CT to  evaluate for any traumatic injuries.  We will give him IV fluids and evaluate for any signs of dehydration.  No significant leukocytosis, anemia, electrolyte abnormality.  Creatinine is improved from 2 days ago.  Potassium slightly improved as well.  Urinalysis negative for infection.  Head CT and neck CT without any injuries.  Chest x-ray without infection.  Felt better after IV fluids.  Overall he appears to be improving.  Recommend follow-up with primary care doctor and discharged in ED in good condition.  This chart was dictated using voice recognition software.  Despite best efforts to proofread,  errors can occur which can change the documentation meaning.    Final Clinical Impression(s) / ED Diagnoses Final diagnoses:  Weakness    Rx / DC Orders ED Discharge Orders    None       Lennice Sites, DO 02/24/21 1753

## 2021-02-24 NOTE — Discharge Instructions (Addendum)
Urinalysis is negative for infection.  Continue hydration at home by drinking plenty of water.  Follow-up with your primary care doctor.

## 2021-02-26 ENCOUNTER — Ambulatory Visit: Payer: Medicare Other

## 2021-02-26 ENCOUNTER — Telehealth: Payer: Self-pay | Admitting: *Deleted

## 2021-02-26 ENCOUNTER — Other Ambulatory Visit: Payer: Self-pay | Admitting: Radiation Oncology

## 2021-02-26 MED ORDER — POTASSIUM CHLORIDE CRYS ER 10 MEQ PO TBCR: 20.0000 meq | EXTENDED_RELEASE_TABLET | Freq: Two times a day (BID) | ORAL | 0 refills | Status: DC

## 2021-02-26 MED ORDER — ONDANSETRON HCL 8 MG PO TABS: 8.0000 mg | ORAL_TABLET | Freq: Three times a day (TID) | ORAL | 1 refills | Status: DC | PRN

## 2021-02-26 MED ORDER — CIPROFLOXACIN HCL 500 MG PO TABS: 500.0000 mg | ORAL_TABLET | Freq: Two times a day (BID) | ORAL | 0 refills | Status: DC

## 2021-02-26 NOTE — Telephone Encounter (Signed)
Dietician spoke w/patient and said patient has not had a BM in a week or so and that he feels urinary urgency with only voiding small amounts. Sent message to Dr. Ida Rogue nurse to call patient regarding the bladder issue. This RN spoke with him about the constipation. Patient reports not having a "good firm" stool for over a week. Has been having loose stools of about 1/2 cup about 3-4 times/day. Informed him this is normal and as the RT wears off his stools will become more firm. Encouraged him to push oral fluids.

## 2021-02-26 NOTE — Progress Notes (Signed)
Nutrition Assessment   Reason for Assessment:  Notified by RD from [place] that patient was positive for weight loss and decreased appetite on malnutrition screening report   ASSESSMENT: 70 year old male with rectal cancer.  Past medical history of HTN, CKD.  Patient has completed radiation to rectum on 6/2.  Also taking xeloda.  Has appointment for surgical planning.    Spoke with patient and introduced self.  Reports for the last 5 weeks poor appetite, diarrhea, nausea and bouts of dehydration.  Reports that diarrhea has stopped and has not had bowel movement for over a week now.  Reports that has the urge to urinate but only able to urinate a small amount.  Has not reported this to MD or NP.  Reports that he has been eating for breakfast eggs, toast (small portion) or cereal or oatmeal/cream of wheat for breakfast.  Lunch today was cheese rice and chicken.  Has been eating yogurt as well. Has been drinking ensure plus shakes 2-3 per day.  Has been drinking water, juice tea, body armour drinks.   Medications: Vit D, Vit B12, lomotil as needed, Mag ox, MVI, zofran, KCL   Labs: reviewed from 6/8   Anthropometrics:   Height: 69 inches Weight: 165 lb on 6/8 12/15/20 183 lb BMI: 25  10% weight loss in the last 2 1/2 months, signficant   NUTRITION DIAGNOSIS: Inadequate oral intake related to cancer related treatment side effects as evidenced by 10% weight loss in 2 1/2 months and poor appetite   INTERVENTION:  Message sent to RN regarding patient with no bowel movement and urination issues.  Encouraged patient to snack q 2-3 hours Encouraged good sources of protein at every meal (blander items would be baked chicken, yogurt, etc) Encouraged patient to continue 350 calorie shake  Contact information provided   MONITORING, EVALUATION, GOAL: weight trends, intake   Next Visit: as needed  Anaisha Mago B. Zenia Resides, [place], [place] Registered Dietitian [phone] (mobile)

## 2021-02-26 NOTE — Progress Notes (Signed)
Pt seen in ED and had UA that showed blood, leukocytes and protein. Given his recent radiation and diarrhea from this he is at higher risk of e coli UTI. A new rx was sent in for cipro.

## 2021-02-26 NOTE — Telephone Encounter (Signed)
Spoke with the patient and his wife regarding a message we received from medical oncology.  They report he is having urinary frequency and urgency.  He was evaluated in the ER following a fall and UA was obtained.  His results were reviewed and the PA has sent in a prescription for Cipro to [facility].  They were advised to complete the course of antibiotics and we would follow-up with him.  Patient and his wife verbalized understanding.  [name]. Leonie Green, BSN

## 2021-02-26 NOTE — Telephone Encounter (Signed)
Wife called requesting smaller K+ pill for him and patient wants different antiemetic since the compazine is used for schizophrenia and nausea. He is not comfortable with this medication. Per Ned Card, NP: OK to order KCL 10 meq #2 bid and prn Ondansetron.

## 2021-03-17 ENCOUNTER — Inpatient Hospital Stay: Payer: Medicare Other

## 2021-03-17 ENCOUNTER — Inpatient Hospital Stay (HOSPITAL_BASED_OUTPATIENT_CLINIC_OR_DEPARTMENT_OTHER): Payer: Medicare Other | Admitting: Oncology

## 2021-03-17 ENCOUNTER — Other Ambulatory Visit: Payer: Self-pay

## 2021-03-17 VITALS — BP 131/69 | HR 87 | Temp 97.8°F | Resp 20 | Ht 68.0 in | Wt 165.6 lb

## 2021-03-17 DIAGNOSIS — C20 Malignant neoplasm of rectum: Secondary | ICD-10-CM

## 2021-03-17 DIAGNOSIS — Z510 Encounter for antineoplastic radiation therapy: Secondary | ICD-10-CM | POA: Diagnosis not present

## 2021-03-17 LAB — CEA (IN HOUSE-CHCC): CEA (CHCC-In House): 5.84 ng/mL — ABNORMAL HIGH (ref 0.00–5.00)

## 2021-03-17 LAB — CMP (CANCER CENTER ONLY)
ALT: 10 U/L (ref 0–44)
AST: 14 U/L — ABNORMAL LOW (ref 15–41)
Albumin: 3.4 g/dL — ABNORMAL LOW (ref 3.5–5.0)
Alkaline Phosphatase: 48 U/L (ref 38–126)
Anion gap: 8 (ref 5–15)
BUN: 26 mg/dL — ABNORMAL HIGH (ref 8–23)
CO2: 27 mmol/L (ref 22–32)
Calcium: 8.8 mg/dL — ABNORMAL LOW (ref 8.9–10.3)
Chloride: 106 mmol/L (ref 98–111)
Creatinine: 2.3 mg/dL — ABNORMAL HIGH (ref 0.61–1.24)
GFR, Estimated: 30 mL/min — ABNORMAL LOW (ref >60)
Glucose, Bld: 105 mg/dL — ABNORMAL HIGH (ref 70–99)
Potassium: 3.7 mmol/L (ref 3.5–5.1)
Sodium: 141 mmol/L (ref 135–145)
Total Bilirubin: 0.3 mg/dL (ref 0.3–1.2)
Total Protein: 6.5 g/dL (ref 6.5–8.1)

## 2021-03-17 LAB — CBC WITH DIFFERENTIAL (CANCER CENTER ONLY)
Abs Immature Granulocytes: 0.03 10*3/uL (ref 0.00–0.07)
Basophils Absolute: 0 10*3/uL (ref 0.0–0.1)
Basophils Relative: 0 %
Eosinophils Absolute: 0.1 10*3/uL (ref 0.0–0.5)
Eosinophils Relative: 3 %
HCT: 30.3 % — ABNORMAL LOW (ref 39.0–52.0)
Hemoglobin: 9.7 g/dL — ABNORMAL LOW (ref 13.0–17.0)
Immature Granulocytes: 1 %
Lymphocytes Relative: 11 %
Lymphs Abs: 0.5 10*3/uL — ABNORMAL LOW (ref 0.7–4.0)
MCH: 30.7 pg (ref 26.0–34.0)
MCHC: 32 g/dL (ref 30.0–36.0)
MCV: 95.9 fL (ref 80.0–100.0)
Monocytes Absolute: 0.9 10*3/uL (ref 0.1–1.0)
Monocytes Relative: 20 %
Neutro Abs: 3 10*3/uL (ref 1.7–7.7)
Neutrophils Relative %: 65 %
Platelet Count: 318 10*3/uL (ref 150–400)
RBC: 3.16 MIL/uL — ABNORMAL LOW (ref 4.22–5.81)
RDW: 17.4 % — ABNORMAL HIGH (ref 11.5–15.5)
WBC Count: 4.6 10*3/uL (ref 4.0–10.5)
nRBC: 0 % (ref 0.0–0.2)

## 2021-03-17 LAB — CEA (ACCESS): CEA (CHCC): 5.57 ng/mL — ABNORMAL HIGH (ref 0.00–5.00)

## 2021-03-17 NOTE — Progress Notes (Signed)
[facility] OFFICE PROGRESS NOTE   Diagnosis: Rectal cancer  INTERVAL HISTORY:   Walter. Curci returns as scheduled.  He will see Dr. Morton Stall next week for surgical planning.  He continues to have numbness and tingling in the hands and feet.  Cymbalta has not helped.  This does not interfere with activity.  No rectal bleeding.  He has developed thickening and discoloration of the toenails.  He would like to see a podiatrist.  Objective:  Vital signs in last 24 hours:  Blood pressure 131/69, pulse 87, temperature 97.8 F (36.6 C), temperature source Oral, resp. rate 20, height 5\' 8"  (1.727 m), weight 165 lb 9.6 oz (75.1 kg), SpO2 99 %.    Lymphatics: No cervical, supraclavicular, axillary, or inguinal nodes Resp: Lungs clear bilaterally Cardio: Regular rate and rhythm GI: No hepatosplenomegaly, no mass, nontender, umbilical hernia Vascular: No leg edema, left lower leg is slightly larger than the right side  Skin: Hyperpigmentation of the hands, superficial desquamation at the soles, thickening and hyperpigmentation at the toenails  Portacath/PICC-without erythema  Lab Results:  Lab Results  Component Value Date   WBC 4.6 03/17/2021   HGB 9.7 (L) 03/17/2021   HCT 30.3 (L) 03/17/2021   MCV 95.9 03/17/2021   PLT 318 03/17/2021   NEUTROABS 3.0 03/17/2021    CMP  Lab Results  Component Value Date   NA 141 03/17/2021   K 3.7 03/17/2021   CL 106 03/17/2021   CO2 27 03/17/2021   GLUCOSE 105 (H) 03/17/2021   BUN 26 (H) 03/17/2021   CREATININE 2.30 (H) 03/17/2021   CALCIUM 8.8 (L) 03/17/2021   PROT 6.5 03/17/2021   ALBUMIN 3.4 (L) 03/17/2021   AST 14 (L) 03/17/2021   ALT 10 03/17/2021   ALKPHOS 48 03/17/2021   BILITOT 0.3 03/17/2021   GFRNONAA 30 (L) 03/17/2021    Lab Results  Component Value Date   CEA1 4.32 02/05/2021   Medications: I have reviewed the patient's current medications.   Assessment/Plan: Rectal cancer Rectal nodularity noted on  colonoscopy 06/29/2020, removal of a rectal "polyp" revealed a tubulovillous adenoma with high-grade dysplasia Physical exam 08/04/2020-perianal mass invading the anal canal and sphincter complex-biopsy positive for mucinous adenocarcinoma CTs 08/17/2020-no rectal mass identified, multiple liver cyst and too small to characterize low-attenuation liver lesions, midline ventral hernia containing proximal right colon, groundglass left upper lobe nodule and nonspecific 3 mm right middle lobe nodule Cycle 1 FOLFOX 09/15/2020 MRI pelvis at [facility] 09/17/2020- anal and low rectal mass.  Internal and external sphincters involved by the mass.  Subtle extension of the tumor through the serosa of the low rectum.  No local regional adenopathy. [facility] liver [facility] 09/17/2020- scattered hepatic cysts.  No evidence of metastatic disease. Cycle 2 FOLFOX 09/29/2020 Cycle 3 FOLFOX 10/13/2020 Cycle 4 FOLFOX 10/27/2020-Udenyca Cycle 5 FOLFOX 11/19/2020-Udenyca Cycle 6 FOLFOX 12/02/2020-Udenyca Cycle 7 FOLFOX 12/15/2020-Udenyca Cycle 8 FOLFOX 12/29/2020 -oxaliplatin, 5-FU bolus, and Udenyca held Radiation/Xeloda 01/11/2021-02/18/2021 Renal insufficiency 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 coli UTI/bacteremia Oxaliplatin neuropathy, persistent peripheral tingling following cycle 7 FOLFOX, progressive 02/05/2021- Cymbalta started, discontinued due to lack of efficacy      Disposition: Walter [name] completed a course of neoadjuvant chemotherapy and  chemotherapy/radiation.  He is scheduled to see Dr. Morton Stall next week.  I will defer a restaging evaluation to Dr. Morton Stall.  Walter. Bauserman will let  me know if Dr. Morton Stall would prefer he have restaging CTs here.  He will return for an office visit after the rectal surgery.  We made a referral to podiatry.  Betsy Coder, MD  03/17/2021  11:36 AM

## 2021-03-19 ENCOUNTER — Telehealth: Payer: Self-pay

## 2021-03-19 NOTE — Telephone Encounter (Signed)
Nutrition  Called for nutrition follow-up.  No answer.  Left message with call back number.   Ivon Roedel B. Jamall Strohmeier, RD, LDN Registered Dietitian [phone] (mobile)  

## 2021-03-20 ENCOUNTER — Emergency Department (HOSPITAL_COMMUNITY)
Admission: EM | Admit: 2021-03-20 | Discharge: 2021-03-20 | Disposition: A | Discharge status: Home / Self Care | Payer: Medicare Other | Attending: Emergency Medicine | Admitting: Emergency Medicine

## 2021-03-20 ENCOUNTER — Other Ambulatory Visit: Payer: Self-pay

## 2021-03-20 ENCOUNTER — Emergency Department (HOSPITAL_COMMUNITY): Payer: Medicare Other

## 2021-03-20 DIAGNOSIS — I129 Hypertensive chronic kidney disease with stage 1 through stage 4 chronic kidney disease, or unspecified chronic kidney disease: Secondary | ICD-10-CM | POA: Diagnosis not present

## 2021-03-20 DIAGNOSIS — Z7982 Long term (current) use of aspirin: Secondary | ICD-10-CM | POA: Diagnosis not present

## 2021-03-20 DIAGNOSIS — R059 Cough, unspecified: Secondary | ICD-10-CM | POA: Diagnosis present

## 2021-03-20 DIAGNOSIS — N189 Chronic kidney disease, unspecified: Secondary | ICD-10-CM | POA: Insufficient documentation

## 2021-03-20 DIAGNOSIS — Z79899 Other long term (current) drug therapy: Secondary | ICD-10-CM | POA: Insufficient documentation

## 2021-03-20 DIAGNOSIS — Z85048 Personal history of other malignant neoplasm of rectum, rectosigmoid junction, and anus: Secondary | ICD-10-CM | POA: Insufficient documentation

## 2021-03-20 DIAGNOSIS — U071 COVID-19: Secondary | ICD-10-CM | POA: Insufficient documentation

## 2021-03-20 LAB — RESP PANEL BY RT-PCR (FLU A&B, COVID) ARPGX2
Influenza A by PCR: NEGATIVE
Influenza B by PCR: NEGATIVE
SARS Coronavirus 2 by RT PCR: POSITIVE — AB

## 2021-03-20 MED ORDER — MOLNUPIRAVIR EUA 200MG CAPSULE: 4.0000 | ORAL_CAPSULE | Freq: Two times a day (BID) | ORAL | 0 refills | Status: AC

## 2021-03-20 MED ORDER — ACETAMINOPHEN 500 MG PO TABS
1000.0000 mg | ORAL_TABLET | Freq: Once | ORAL | Status: AC
  Administered 2021-03-20: 1000 mg via ORAL
  Filled 2021-03-20: qty 2

## 2021-03-20 NOTE — ED Notes (Signed)
Per Jacqlyn Larsen, PA-C

## 2021-03-20 NOTE — Discharge Instructions (Addendum)
You have tested positive for COVID-19 virus.  Please continue to quarantine at home and monitor your symptoms closely. You chest x-ray was clear. Antibiotics are not helpful in treating viral infection, the virus should run its course in about 7-10 days. Please make sure you are drinking plenty of fluids. You can treat your symptoms supportively with tylenol 1000 mg every 6 hours for fevers and pains, and over the counter cough syrups and throat lozenges to help with cough. If your symptoms are not improving please follow up with you Primary doctor.   You can take Molnupiravir, this is an antiviral that can help reduce the risk for severe symptoms that may require hospitalization.  I recommend that you purchase a home pulse ox to help better monitor your oxygen at home, if you start to have increased work of breathing or shortness of breath or your oxygen drops below 90% please immediately return to the hospital for reevaluation.  If you develop persistent fevers, shortness of breath or difficulty breathing, chest pain, severe headache and neck pain, persistent nausea and vomiting or other new or concerning symptoms return to the Emergency department.

## 2021-03-20 NOTE — ED Provider Notes (Addendum)
[facility] DEPT Provider Note   CSN: [id number] Arrival date & time: 03/20/21  1219     History Chief Complaint  Patient presents with   covid testing    Walter [name] is a 70 y.o. male.  Walter [name] is a 70 y.o. male with a history of hypertension, CKD and rectal cancer, who presents to the ED for evaluation of 2 days of cough, congestion and sore throat.  Had a low-grade temp of 99.5 earlier today.  Took Tylenol at 7 AM this morning.  Is concerned and wants to have a COVID test done.  Denies chest pain, shortness of breath or increased difficulty breathing.  No abdominal pain, nausea, vomiting or diarrhea.  Denies urinary symptoms.  Reports a mild headache, no body aches.  His wife is having similar symptoms that started few days prior but he denies any other known sick contacts.  Does report that he recently got together with extended family for gathering.  Patient recently completed chemotherapy for rectal cancer as well as radiation and is now in the process of following up for surgery.  Followed by Dr. Benay Spice with oncology.  The history is provided by the patient and the spouse.      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 Authorizing Provider  molnupiravir EUA 200 mg CAPS Take 4 capsules (800 mg total) by mouth 2 (two) times daily for 5 days. 03/20/21 03/25/21 Yes Jacqlyn Larsen, PA-C  acetaminophen (TYLENOL) 500 MG tablet Take 500 mg by mouth 2 (two) times daily. As needed    [provider]  amLODipine (NORVASC) 10 MG tablet Take 10 mg by mouth daily. 07/09/20   [provider]  aspirin 81 MG EC tablet Take 81 mg by mouth every Monday. Patient not taking: Reported on 03/17/2021    [provider]  capecitabine (XELODA) 500 MG tablet Take 3 tablets (1500 mg) by mouth in AM and 2 tablets (1000 mg) by mouth in PM (total dose 2500 mg daily). Take within 30 minutes after meals. Take on days of radiation only. Patient not taking: Reported on 03/17/2021 01/11/21   Ladell Pier, MD  chlorthalidone (HYGROTON) 25 MG tablet Take 25 mg by mouth daily. 07/10/20   [provider]  Cholecalciferol (D3-1000 PO) Take 1 tablet by mouth daily.    [provider]  ciprofloxacin (CIPRO) 500 MG tablet Take 1 tablet (500 mg total) by mouth 2 (two) times daily. 02/26/21  Hayden Pedro, PA-C  Cyanocobalamin (VITAMIN B-12) 5000 MCG SUBL Place 5,000 mcg under the tongue 2 (two) times daily.    [provider]  diphenoxylate-atropine (LOMOTIL) 2.5-0.025 MG tablet Take 2 tablets by mouth 4 (four) times daily as needed for diarrhea or loose stools. Patient not taking: Reported on 03/17/2021 02/18/21   Kyung Rudd, MD  DULoxetine (CYMBALTA) 30 MG capsule Take 1 capsule (30 mg total) by mouth daily. Take 30 mg daily for 1 week Patient not taking: Reported on 03/17/2021 02/05/21   Ladell Pier, MD  DULoxetine (CYMBALTA) 60 MG capsule Take 1 capsule (60 mg total) by mouth daily. Patient not taking: Reported on 03/17/2021 02/05/21   Ladell Pier, MD  lidocaine-prilocaine (EMLA)  cream Apply to port site 1-2 hours prior to use 12/02/20   Owens Shark, NP  magnesium oxide (MAG-OX) 400 (240 Mg) MG tablet Take 1 tablet (400 mg total) by mouth 2 (two) times daily. 02/05/21   Ladell Pier, MD  Multiple Vitamin (MULTIVITAMIN WITH MINERALS) TABS tablet Take 1 tablet by mouth every Monday.    [provider]  ondansetron (ZOFRAN) 8 MG tablet Take 1 tablet (8 mg total) by mouth every 8 (eight) hours as needed for nausea or vomiting. 02/26/21   Owens Shark, NP  potassium chloride (KLOR-CON) 10 MEQ tablet Take 2 tablets (20 mEq total) by mouth 2 (two) times daily. 02/26/21   Owens Shark, NP    Allergies    Patient has no known allergies.  Review of Systems   Review of Systems  Constitutional:  Positive for chills and fever.  HENT:  Positive for congestion, rhinorrhea and sore throat.   Respiratory:  Positive for cough. Negative for shortness of breath and wheezing.   Cardiovascular:  Negative for chest pain.  Gastrointestinal:  Negative for abdominal pain, diarrhea, nausea and vomiting.  Musculoskeletal:  Negative for arthralgias and myalgias.  Skin:  Negative for color change and rash.  Neurological:  Positive for headaches.  All other systems reviewed and are negative.  Physical Exam Updated Vital Signs BP (!) 144/74 (BP Location: Left Arm)   Pulse 76   Temp (!) 101.9 F (38.8 C) (Oral) Comment: Walter Leavens pa reported   Resp 20   Ht 5\' 9"  (1.753 m)   Wt 74.8 kg   SpO2 96%   BMI 24.37 kg/m   Physical Exam Vitals and nursing note reviewed.  Constitutional:      General: He is not in acute distress.    Appearance: Normal appearance. He is well-developed and normal weight. He is not ill-appearing or diaphoretic.     Comments: Well-appearing and in no distress   HENT:     Head: Normocephalic and atraumatic.     Nose: Rhinorrhea present.     Mouth/Throat:     Mouth: Mucous membranes are moist.     Pharynx: Oropharynx is clear.  Eyes:     General:         Right eye: No discharge.        Left eye: No discharge.  Neck:     Comments: No rigidity Cardiovascular:     Rate and Rhythm: Normal rate and regular rhythm.     Heart sounds: Normal heart sounds. No murmur heard.   No friction rub. No gallop.  Pulmonary:     Effort: Pulmonary effort is normal. No respiratory distress.     Breath sounds: Normal breath sounds.     Comments: Respirations  equal and unlabored, patient able to speak in full sentences, lungs clear to auscultation bilaterally  Abdominal:     General: Bowel sounds are normal. There is no distension.     Palpations: Abdomen is soft. There is no mass.     Tenderness: There is no abdominal tenderness. There is no guarding.     Comments: Abdomen soft, nondistended, nontender to palpation in all quadrants without guarding or peritoneal signs  Musculoskeletal:        General: No deformity.     Cervical back: Neck supple.  Lymphadenopathy:     Cervical: No cervical adenopathy.  Skin:    General: Skin is warm and dry.     Capillary Refill: Capillary refill takes less than 2 seconds.  Neurological:     Mental Status: He is alert and oriented to person, place, and time.  Psychiatric:        Mood and Affect: Mood normal.        Behavior: Behavior normal.    ED Results / Procedures / Treatments   Labs (all labs ordered are listed, but only abnormal results are displayed) Labs Reviewed  RESP PANEL BY RT-PCR (FLU A&B, COVID) ARPGX2 - Abnormal; Notable for the following components:      Result Value   SARS Coronavirus 2 by RT PCR POSITIVE (*)    All other components within normal limits    EKG None  Radiology DG Chest Port 1 View  Result Date: 03/20/2021 CLINICAL DATA:  Sore throat with cough and fever. EXAM: PORTABLE CHEST 1 VIEW COMPARISON:  02/24/2021 FINDINGS: Stable asymmetric elevation left hemidiaphragm with similar appearance of gas filled bowel underlying. The lungs are clear without focal pneumonia, edema,  pneumothorax or pleural effusion. Right Port-A-Cath again noted. The visualized bony structures of the thorax show no acute abnormality. IMPRESSION: Stable.  No acute cardiopulmonary findings. Electronically Signed   By: Misty Stanley M.D.   On: 03/20/2021 14:08    Procedures Procedures   Medications Ordered in ED Medications  acetaminophen (TYLENOL) tablet 1,000 mg (1,000 mg Oral Given 03/20/21 1351)    ED Course  I have reviewed the triage vital signs and the nursing notes.  Pertinent labs & imaging results that were available during my care of the patient were reviewed by me and considered in my medical decision making (see chart for details).    MDM Rules/Calculators/A&P                         70 y.o. male presents with 2 days of chills, cough, congestion and sore throat, low-grade fever of 99.5 noted at home. Concerned for possible COVID.  Patient recently had a large family gathering, his wife is experiencing similar symptoms as well.  Patient has received COVID vaccines but has not had a booster. Fortunately patient is overall well-appearing, Abrol at 101.9 on my exam, but vitals otherwise reassuring and patient is well-appearing.  No hypoxia or increased work of breathing at rest.  Chest x-ray reviewed and is clear without signs of COVID-pneumonia at this time, no other active cardiopulmonary disease noted.  Patient ambulated in department and maintained normal O2 sats.  He is not having vomiting, no chest pain, do not feel that lab work is indicated.  Patient's COVID test is returned positive which explains patient's symptoms.  Patient recently completed chemotherapy, is vaccinated but still has significant risk for worsening symptoms and hospitalization.  Reviewed patient's recent lab work from 3 days  ago at oncology office, he is not a candidate for Paxil with but will prescribe molnupiravir, discussed this medication as well as symptomatic treatment with patient and his  wife.  Patient with COVID infection today but overall symptoms appear mild and evaluation has been very reassuring today. No criteria for admission at this time. Discussed appropriate quarantine at home as well as continued symptomatic treatment. Encourage patient to purchase pulse ox for monitoring of O2 sats at home and discussed strict return precaution. Provided information for post-COVID care clinic as well. Strict return precautions discussed. Patient expresses understanding and agreement. Discharged home in good condition.  Walter [name] was evaluated in Emergency Department on 03/20/2021 for the symptoms described in the history of present illness. He was evaluated in the context of the global COVID-19 pandemic, which necessitated consideration that the patient might be at risk for infection with the SARS-CoV-2 virus that causes COVID-19. Institutional protocols and algorithms that pertain to the evaluation of patients at risk for COVID-19 are in a state of rapid change based on information released by regulatory bodies including the CDC and federal and state organizations. These policies and algorithms were followed during the patient's care in the ED.  Final Clinical Impression(s) / ED Diagnoses Final diagnoses:  [id number] virus infection    Rx / DC Orders ED Discharge Orders          Ordered    molnupiravir EUA 200 mg CAPS  2 times daily        03/20/21 1518             Jacqlyn Larsen, PA-C 03/20/21 1610    Jacqlyn Larsen, [place] 03/20/21 1613    Sherwood Gambler, MD 03/20/21 1728

## 2021-03-20 NOTE — ED Triage Notes (Signed)
Patient reports he wants to be tested for covid due to sore throat, cough, fever x 2 days. No known exposures. Denies pain. Denies difficulty breathing

## 2021-03-20 NOTE — ED Notes (Signed)
Per Jacqlyn Larsen, PA-C okay to d.c patient.

## 2021-03-24 ENCOUNTER — Ambulatory Visit: Payer: Medicare Other | Admitting: Podiatrist

## 2021-03-31 ENCOUNTER — Ambulatory Visit (INDEPENDENT_AMBULATORY_CARE_PROVIDER_SITE_OTHER): Payer: Medicare Other | Admitting: Podiatry

## 2021-03-31 ENCOUNTER — Other Ambulatory Visit: Payer: Self-pay

## 2021-03-31 DIAGNOSIS — B351 Tinea unguium: Secondary | ICD-10-CM

## 2021-03-31 DIAGNOSIS — M79676 Pain in unspecified toe(s): Secondary | ICD-10-CM

## 2021-03-31 DIAGNOSIS — G620 Drug-induced polyneuropathy: Secondary | ICD-10-CM

## 2021-03-31 DIAGNOSIS — T451X5A Adverse effect of antineoplastic and immunosuppressive drugs, initial encounter: Secondary | ICD-10-CM | POA: Diagnosis not present

## 2021-03-31 NOTE — Progress Notes (Signed)
   SUBJECTIVE Patient presents to office today complaining of elongated, thickened nails that cause pain while ambulating in shoes.  Patient is unable to trim their own nails. Patient is here for further evaluation and treatment.  Past Medical History:  Diagnosis Date   Hypertension    Rectal cancer ([place]) dx'd 07/2020   testicular ca    XRT comp    OBJECTIVE General Patient is awake, alert, and oriented x 3 and in no acute distress. Derm Skin is dry and supple bilateral. Negative open lesions or macerations. Remaining integument unremarkable. Nails are tender, long, thickened and dystrophic with subungual debris, consistent with onychomycosis, 1-5 bilateral. No signs of infection noted. Vasc  DP and PT pedal pulses palpable bilaterally. Temperature gradient within normal limits.  Neuro Epicritic and protective threshold sensation grossly intact bilaterally.  Musculoskeletal Exam No symptomatic pedal deformities noted bilateral. Muscular strength within normal limits.  ASSESSMENT 1.  Pain due to onychomycosis of toenails both  PLAN OF CARE 1. Patient evaluated today.  2. Instructed to maintain good pedal hygiene and foot care.  3. Mechanical debridement of nails 1-5 bilaterally performed using a nail nipper. Filed with dremel without incident.  4. Return to clinic in 3 mos.    Edrick Kins, DPM [facility]  Dr. Edrick Kins, DPM    [address], [place] [address]                Office [phone]  Fax [phone]

## 2021-04-12 ENCOUNTER — Ambulatory Visit
Admission: RE | Admit: 2021-04-12 | Discharge: 2021-04-12 | Disposition: A | Discharge status: Home / Self Care | Payer: Medicare Other | Source: Ambulatory Visit | Attending: Radiation Oncology | Admitting: Radiation Oncology

## 2021-04-12 ENCOUNTER — Other Ambulatory Visit: Payer: Self-pay

## 2021-04-12 DIAGNOSIS — C20 Malignant neoplasm of rectum: Secondary | ICD-10-CM | POA: Insufficient documentation

## 2021-04-12 NOTE — Progress Notes (Signed)
  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 During treatment he did very well with radiotherapy and did not have significant desquamation. He is getting set up for surgery with Dr. Morton Stall.   Impression/Plan: 1. 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 I was unable to reach the patient by phone but left a voicemail and on the message, I discussed that we would be happy to continue to follow him as needed, but he will also continue to follow up with Dr.  Morton Stall in colorectal surgery and Dr. Benay Spice in medical oncology.      Carola Rhine, PAC

## 2021-05-03 ENCOUNTER — Other Ambulatory Visit: Payer: Self-pay

## 2021-05-03 ENCOUNTER — Inpatient Hospital Stay (HOSPITAL_BASED_OUTPATIENT_CLINIC_OR_DEPARTMENT_OTHER): Payer: Medicare Other | Admitting: Oncology

## 2021-05-03 ENCOUNTER — Inpatient Hospital Stay: Payer: Medicare Other | Attending: Oncology

## 2021-05-03 VITALS — BP 130/60 | HR 92 | Temp 98.1°F | Resp 18 | Ht 69.0 in | Wt 170.8 lb

## 2021-05-03 DIAGNOSIS — Z923 Personal history of irradiation: Secondary | ICD-10-CM | POA: Diagnosis not present

## 2021-05-03 DIAGNOSIS — Z95828 Presence of other vascular implants and grafts: Secondary | ICD-10-CM

## 2021-05-03 DIAGNOSIS — Z87442 Personal history of urinary calculi: Secondary | ICD-10-CM | POA: Diagnosis not present

## 2021-05-03 DIAGNOSIS — N289 Disorder of kidney and ureter, unspecified: Secondary | ICD-10-CM | POA: Diagnosis not present

## 2021-05-03 DIAGNOSIS — C20 Malignant neoplasm of rectum: Secondary | ICD-10-CM | POA: Diagnosis present

## 2021-05-03 DIAGNOSIS — G620 Drug-induced polyneuropathy: Secondary | ICD-10-CM | POA: Insufficient documentation

## 2021-05-03 DIAGNOSIS — G4730 Sleep apnea, unspecified: Secondary | ICD-10-CM | POA: Insufficient documentation

## 2021-05-03 DIAGNOSIS — Z452 Encounter for adjustment and management of vascular access device: Secondary | ICD-10-CM | POA: Insufficient documentation

## 2021-05-03 DIAGNOSIS — I10 Essential (primary) hypertension: Secondary | ICD-10-CM | POA: Diagnosis not present

## 2021-05-03 MED ORDER — SODIUM CHLORIDE 0.9% FLUSH
10.0000 mL | Freq: Once | INTRAVENOUS | Status: AC
  Administered 2021-05-03: 10 mL

## 2021-05-03 MED ORDER — HEPARIN SOD (PORK) LOCK FLUSH 100 UNIT/ML IV SOLN
500.0000 [IU] | Freq: Once | INTRAVENOUS | Status: AC
  Administered 2021-05-03: 500 [IU]

## 2021-05-03 NOTE — Progress Notes (Signed)
[facility] OFFICE PROGRESS NOTE   Diagnosis: Rectal cancer  INTERVAL HISTORY:   Walter [name] returns as scheduled.  He reports feeling well.  Neuropathy symptoms have almost resolved in the feet.  He continues to have numbness in the hands, partially improved. He saw Walter [name] on 04/08/2021.  Physical exam/anoscopy revealed no residual tumor.  He is scheduled for an exam under anesthesia with biopsies on 05/28/2021.  Objective:  Vital signs in last 24 hours:  Blood pressure 130/60, pulse 92, temperature 98.1 F (36.7 C), temperature source Oral, resp. rate 18, height '5\' 9"'$  (1.753 m), weight 170 lb 12.8 oz (77.5 kg), SpO2 98 %.   Lymphatics: No cervical, supraclavicular, axillary, or inguinal nodes Resp: Lungs clear bilaterally Cardio: Regular rate and rhythm GI: No hepatosplenomegaly, no mass Vascular: No leg edema, the left lower leg is slightly larger than the right side  Skin: Hands without erythema  Portacath/PICC-without erythema  Lab Results:  Lab Results  Component Value Date   WBC 4.6 03/17/2021   HGB 9.7 (L) 03/17/2021   HCT 30.3 (L) 03/17/2021   MCV 95.9 03/17/2021   PLT 318 03/17/2021   NEUTROABS 3.0 03/17/2021    CMP  Lab Results  Component Value Date   NA 141 03/17/2021   K 3.7 03/17/2021   CL 106 03/17/2021   CO2 27 03/17/2021   GLUCOSE 105 (H) 03/17/2021   BUN 26 (H) 03/17/2021   CREATININE 2.30 (H) 03/17/2021   CALCIUM 8.8 (L) 03/17/2021   PROT 6.5 03/17/2021   ALBUMIN 3.4 (L) 03/17/2021   AST 14 (L) 03/17/2021   ALT 10 03/17/2021   ALKPHOS 48 03/17/2021   BILITOT 0.3 03/17/2021   GFRNONAA 30 (L) 03/17/2021    Lab Results  Component Value Date   CEA1 5.84 (H) 03/17/2021   CEA 5.57 (H) 03/17/2021      Medications: I have reviewed the patient's current medications.   Assessment/Plan: Rectal cancer Rectal nodularity noted on colonoscopy 06/29/2020, removal of a rectal "polyp" revealed a tubulovillous adenoma with  high-grade dysplasia Physical exam 08/04/2020-perianal mass invading the anal canal and sphincter complex-biopsy positive for mucinous adenocarcinoma CTs 08/17/2020-no rectal mass identified, multiple liver cyst and too small to characterize low-attenuation liver lesions, midline ventral hernia containing proximal right colon, groundglass left upper lobe nodule and nonspecific 3 mm right middle lobe nodule Cycle 1 FOLFOX 09/15/2020 MRI [facility] 09/17/2020- anal and low rectal mass.  Internal and external sphincters involved by the mass.  Subtle extension of the tumor through the serosa of the low rectum.  No local regional adenopathy. MRI liver at [facility] 09/17/2020- scattered hepatic cysts.  No evidence of metastatic disease. 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 coli UTI/bacteremia Oxaliplatin neuropathy, persistent peripheral tingling following cycle 7 FOLFOX, progressive 02/05/2021- Cymbalta started, discontinued due to lack of efficacy, neuropathy improved 05/03/2021        Disposition: Walter [name] completed a course of total neoadjuvant therapy.  A  clinical restaging exam by Walter [name] on 04/08/2021 revealed no evidence of residual tumor.  He is scheduled for an exam under anesthesia and  biopsies on 05/28/2021.  Walter [name] will return for an office visit and restaging CT evaluation during the week of 06/07/2021.  Oxaliplatin neuropathy symptoms are improving.  Betsy Coder, MD  05/03/2021  11:30 AM

## 2021-05-07 ENCOUNTER — Telehealth: Payer: Self-pay | Admitting: Oncology

## 2021-05-07 NOTE — Telephone Encounter (Signed)
Per 8/19 sch msg - rescheduled port and lab appt from 9/19 to 9/15 to be before ct scan. Patient is aware of appt date and time

## 2021-05-21 IMAGING — DX DG CHEST 1V PORT
2 series · 2 of 2 positions shown · non-contrast
Comparison: None.

CLINICAL DATA: Questionable sepsis.

EXAM:
PORTABLE CHEST 1 VIEW

[chest ap (1 of 2)]
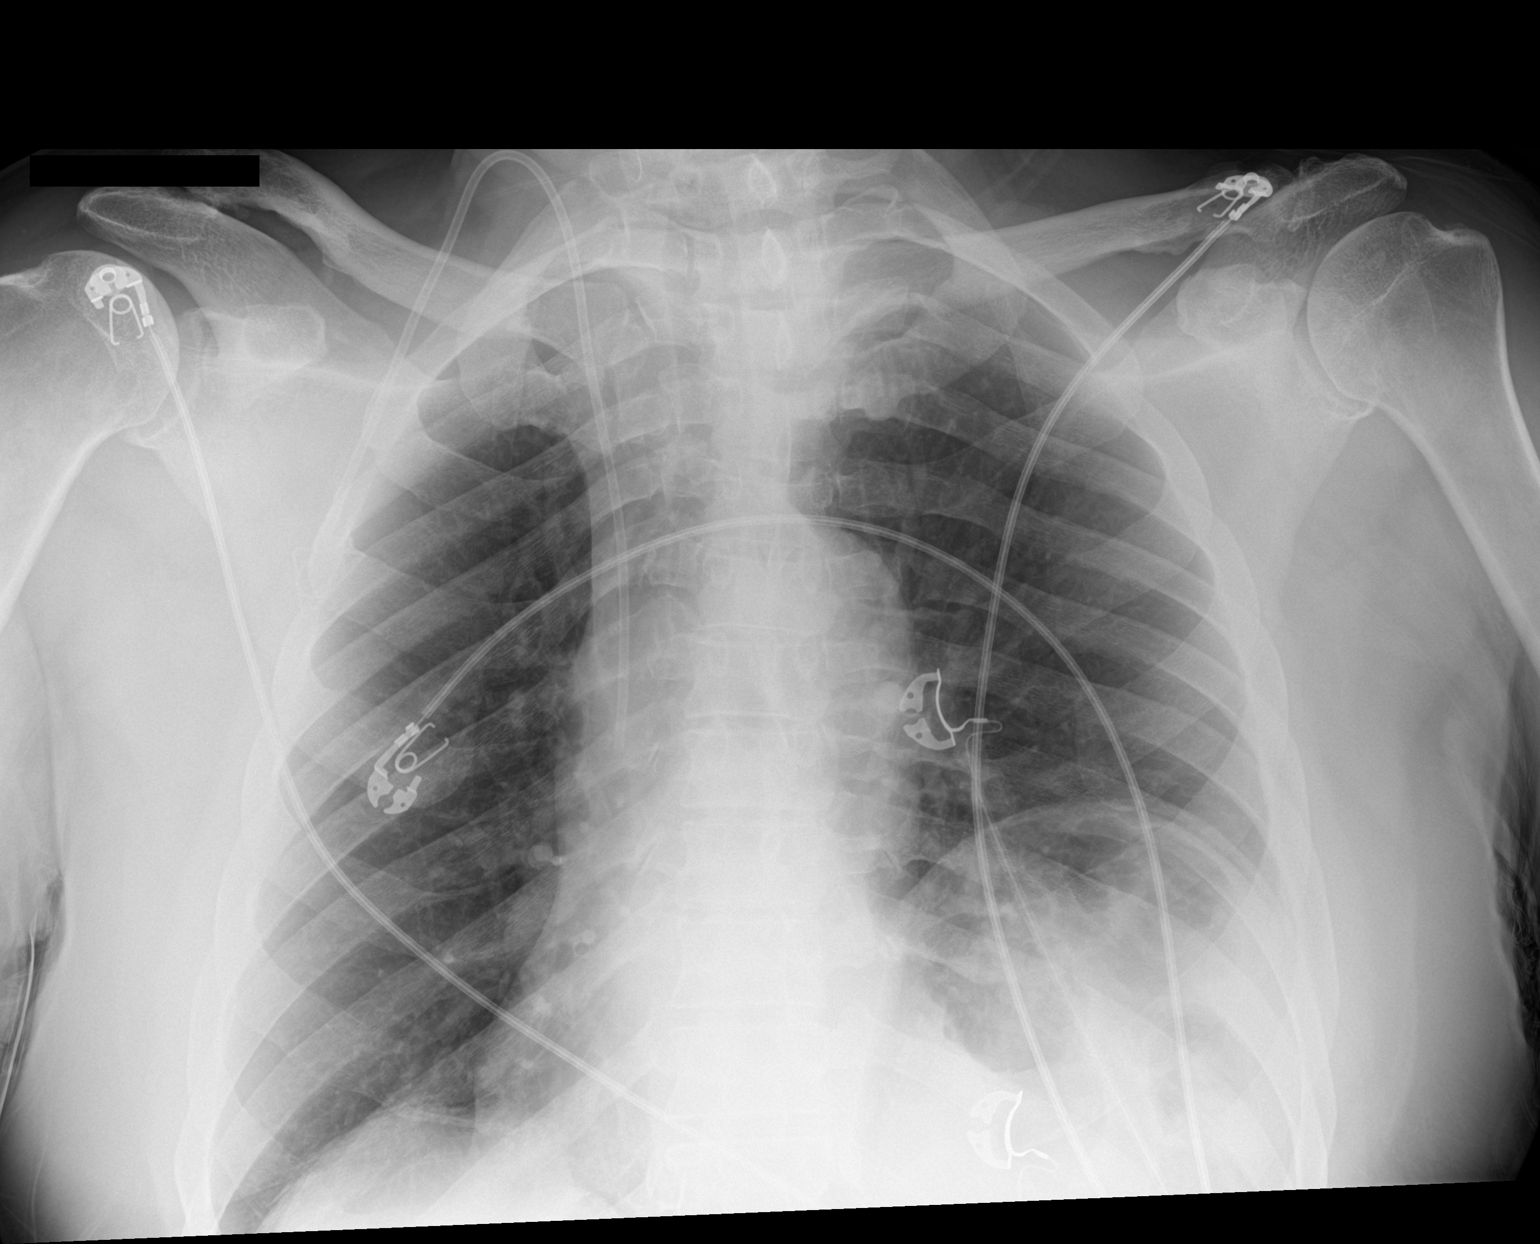

[chest ap (2 of 2)]
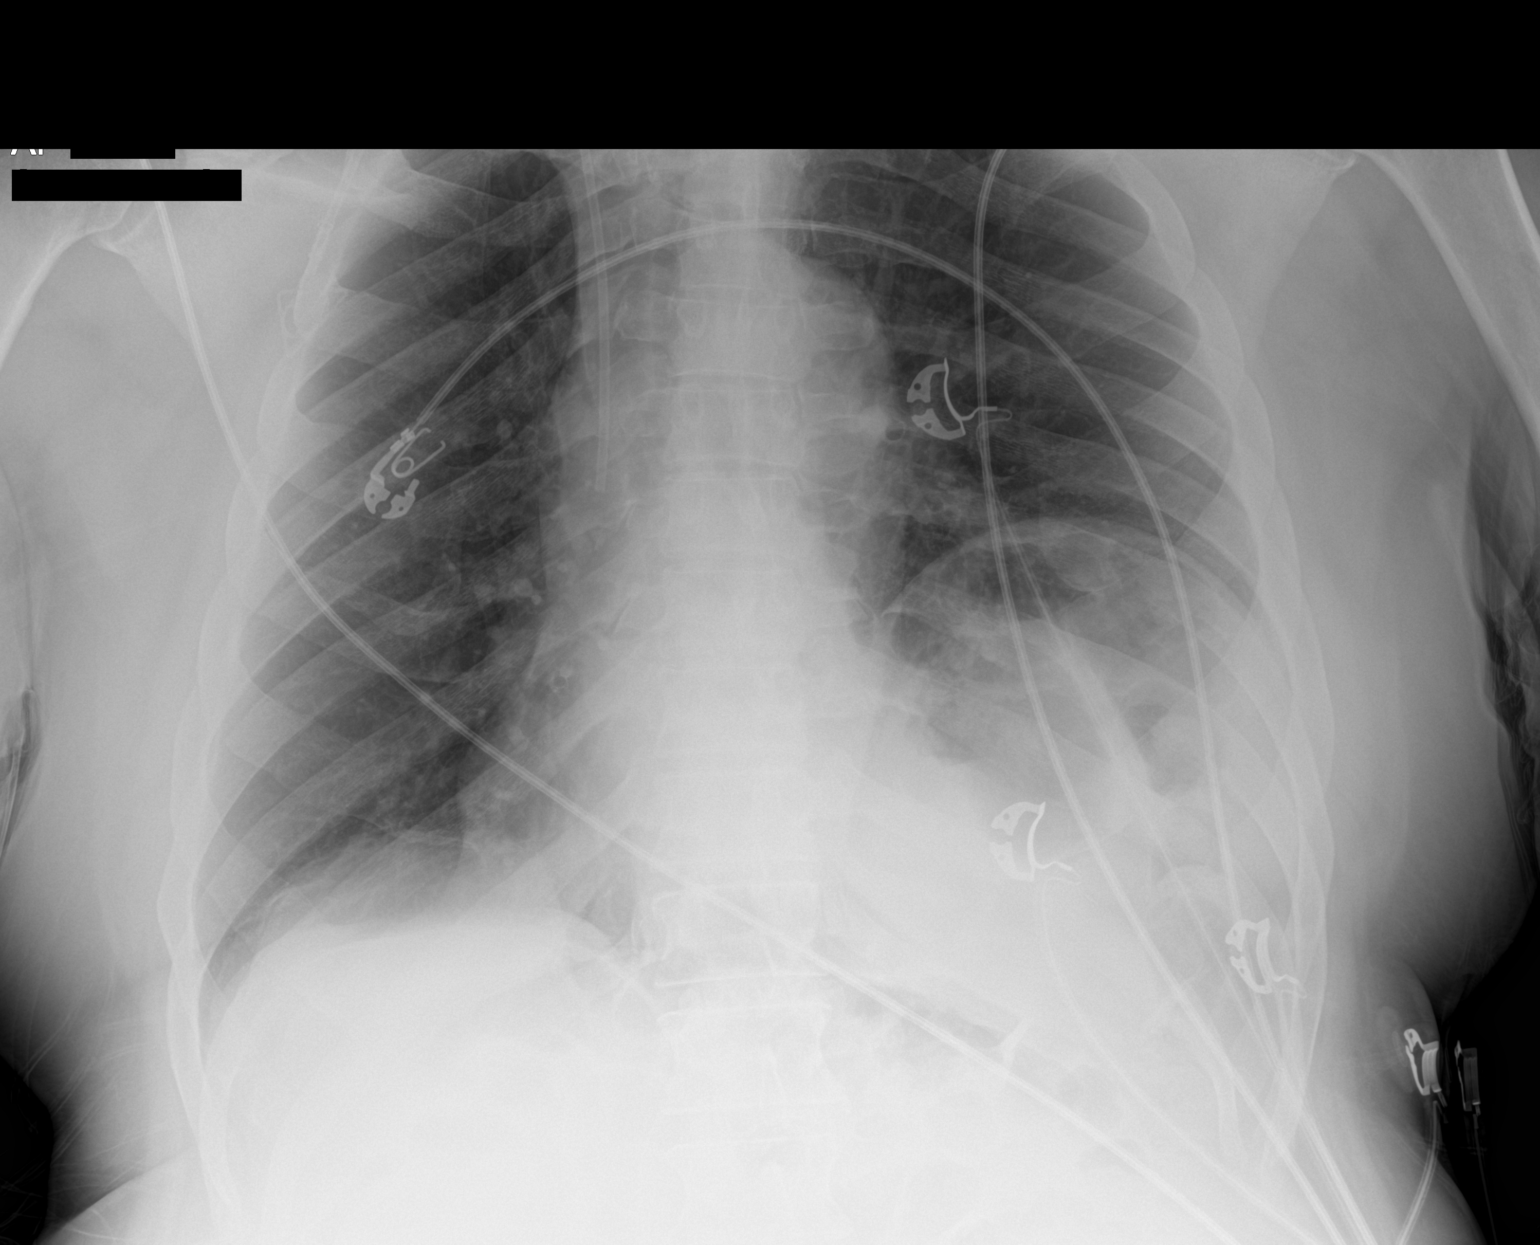

[2 of 2 positions shown; findings below may reference images not displayed]

FINDINGS: A right Port-A-Cath terminates in the central SVC. No pneumothorax.
There is elevation left hemidiaphragm. The lungs are clear. The
cardiomediastinal silhouette is unremarkable. No other acute
abnormalities.
IMPRESSION: No active disease.

## 2021-05-28 ENCOUNTER — Other Ambulatory Visit: Payer: Self-pay

## 2021-05-28 ENCOUNTER — Encounter: Payer: Self-pay | Admitting: Nurse Practitioner

## 2021-05-28 ENCOUNTER — Inpatient Hospital Stay: Payer: Medicare Other

## 2021-05-28 ENCOUNTER — Ambulatory Visit (HOSPITAL_COMMUNITY)
Admission: RE | Admit: 2021-05-28 | Discharge: 2021-05-28 | Disposition: A | Discharge status: Home / Self Care | Payer: Medicare Other | Source: Ambulatory Visit | Attending: Oncology | Admitting: Oncology

## 2021-05-28 ENCOUNTER — Inpatient Hospital Stay: Payer: Medicare Other | Attending: Oncology | Admitting: Nurse Practitioner

## 2021-05-28 VITALS — BP 127/86 | HR 87 | Temp 98.9°F | Resp 18 | Wt 170.4 lb

## 2021-05-28 DIAGNOSIS — C20 Malignant neoplasm of rectum: Secondary | ICD-10-CM

## 2021-05-28 DIAGNOSIS — I82621 Acute embolism and thrombosis of deep veins of right upper extremity: Secondary | ICD-10-CM

## 2021-05-28 DIAGNOSIS — Z87442 Personal history of urinary calculi: Secondary | ICD-10-CM | POA: Insufficient documentation

## 2021-05-28 DIAGNOSIS — Z85048 Personal history of other malignant neoplasm of rectum, rectosigmoid junction, and anus: Secondary | ICD-10-CM | POA: Insufficient documentation

## 2021-05-28 DIAGNOSIS — G4730 Sleep apnea, unspecified: Secondary | ICD-10-CM | POA: Insufficient documentation

## 2021-05-28 DIAGNOSIS — I82C11 Acute embolism and thrombosis of right internal jugular vein: Secondary | ICD-10-CM | POA: Insufficient documentation

## 2021-05-28 DIAGNOSIS — Z7901 Long term (current) use of anticoagulants: Secondary | ICD-10-CM | POA: Insufficient documentation

## 2021-05-28 DIAGNOSIS — R221 Localized swelling, mass and lump, neck: Secondary | ICD-10-CM

## 2021-05-28 DIAGNOSIS — G620 Drug-induced polyneuropathy: Secondary | ICD-10-CM | POA: Insufficient documentation

## 2021-05-28 DIAGNOSIS — N289 Disorder of kidney and ureter, unspecified: Secondary | ICD-10-CM | POA: Insufficient documentation

## 2021-05-28 DIAGNOSIS — I10 Essential (primary) hypertension: Secondary | ICD-10-CM | POA: Insufficient documentation

## 2021-05-28 MED ORDER — APIXABAN 5 MG PO TABS: 5.0000 mg | ORAL_TABLET | Freq: Two times a day (BID) | ORAL | 2 refills | Status: DC

## 2021-05-28 MED ORDER — AMOXICILLIN-POT CLAVULANATE 500-125 MG PO TABS: 1.0000 | ORAL_TABLET | Freq: Two times a day (BID) | ORAL | 0 refills | Status: AC

## 2021-05-28 NOTE — Progress Notes (Signed)
Right upper extremity venous duplex has been completed. Preliminary results can be found in CV Proc through chart review.  Results were given to Ned Card NP.  05/28/21 4:54 PM Carlos Levering RVT

## 2021-05-28 NOTE — Progress Notes (Signed)
[place] OFFICE PROGRESS NOTE   Diagnosis: Rectal cancer  INTERVAL HISTORY:   Walter [name] returns prior to scheduled follow-up for evaluation of swelling at the right neck.  He reports a sore throat and swelling with associated pain at the right neck.  He denies arm edema.  No fever or shaking chills.  No dysphagia  Objective:  Vital signs in last 24 hours:  Blood pressure 127/86, pulse 87, temperature 98.9 F (37.2 C), temperature source Oral, resp. rate 18, weight 170 lb 6.4 oz (77.3 kg), SpO2 96 %.    HEENT: Posterior pharynx is without erythema. Lymphatics: No neck adenopathy. Resp: Lungs clear bilaterally. Cardio: Regular rate and rhythm. GI: Soft and nontender.   Vascular: Right mid neck with focal soft tissue swelling, associated tenderness.  Right lower arm appears slightly larger than the left lower arm but there is no edema.  No erythema.  No vein engorgement over the chest wall.  No edema over the right chest/lower neck.  Port-A-Cath without erythema.  Lab Results:  Lab Results  Component Value Date   WBC 4.6 03/17/2021   HGB 9.7 (L) 03/17/2021   HCT 30.3 (L) 03/17/2021   MCV 95.9 03/17/2021   PLT 318 03/17/2021   NEUTROABS 3.0 03/17/2021    Imaging:  No results found.  Medications: I have reviewed the patient's current medications.  Assessment/Plan: Rectal cancer Rectal nodularity noted on colonoscopy 06/29/2020, removal of a rectal "polyp" revealed a tubulovillous adenoma with high-grade dysplasia Physical exam 08/04/2020-perianal mass invading the anal canal and sphincter complex-biopsy positive for mucinous adenocarcinoma CTs 08/17/2020-no rectal mass identified, multiple liver cyst and too small to characterize low-attenuation liver lesions, midline ventral hernia containing proximal right colon, groundglass left upper lobe nodule and nonspecific 3 mm right middle lobe nodule Cycle 1 FOLFOX 09/15/2020 MRI pelvis at [facility]  09/17/2020- anal and low rectal mass.  Internal and external sphincters involved by the mass.  Subtle extension of the tumor through the serosa of the low rectum.  No local regional adenopathy. MRI liver at [facility] 09/17/2020- scattered hepatic cysts.  No evidence of metastatic disease. 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 coli UTI/bacteremia Oxaliplatin neuropathy, persistent peripheral tingling following cycle 7 FOLFOX, progressive 02/05/2021- Cymbalta started, discontinued due to lack of efficacy, neuropathy improved 05/03/2021    Disposition: Walter [name] presents with a 2-day history of a sore throat and right neck edema/associated pain.   He has had no fever, denies dysphagia.  We are referring him for a Doppler ultrasound to rule out a Port-A-Cath associated blood clot.  He will begin a course of Augmentin empirically.  If a blood clot is confirmed anticoagulation will be initiated.  Plan follow-up as scheduled.  Patient seen with Dr. Benay Spice.  Ned Card ANP/GNP-BC   05/28/2021  3:27 PM  4:56 PM-received call from vascular, positive right IJ DVT.  Prescription sent to pharmacy for Eliquis 5 mg twice daily.  I spoke with Walter [name] by  phone and confirmed he no longer takes aspirin 81 mg daily.   He will begin Eliquis as above.  This was a shared visit with Ned Card.  Walter [name] was interviewed and examined.  He has been diagnosed with a right IJ DVT associated with a right-sided Port-A-Cath.  He will begin apixaban anticoagulation.  I was present for greater than 50% of today's visit.  I performed medical decision making.  Julieanne Manson, MD

## 2021-05-31 ENCOUNTER — Other Ambulatory Visit (HOSPITAL_COMMUNITY): Payer: Self-pay

## 2021-05-31 ENCOUNTER — Encounter: Payer: Self-pay | Admitting: Oncology

## 2021-05-31 ENCOUNTER — Other Ambulatory Visit: Payer: Self-pay | Admitting: *Deleted

## 2021-05-31 DIAGNOSIS — C20 Malignant neoplasm of rectum: Secondary | ICD-10-CM

## 2021-05-31 DIAGNOSIS — I82621 Acute embolism and thrombosis of deep veins of right upper extremity: Secondary | ICD-10-CM

## 2021-05-31 MED ORDER — APIXABAN 5 MG PO TABS
5.0000 mg | ORAL_TABLET | Freq: Two times a day (BID) | ORAL | 2 refills | Status: DC
  Filled 2021-05-31: qty 60, 30d supply, fill #0
  Filled 2021-07-02 – 2021-07-26 (×3): qty 60, 30d supply, fill #1

## 2021-06-03 ENCOUNTER — Ambulatory Visit (HOSPITAL_BASED_OUTPATIENT_CLINIC_OR_DEPARTMENT_OTHER): Admission: RE | Admit: 2021-06-03 | Payer: Medicare Other | Source: Ambulatory Visit

## 2021-06-03 ENCOUNTER — Inpatient Hospital Stay: Payer: Medicare Other

## 2021-06-03 ENCOUNTER — Other Ambulatory Visit: Payer: Self-pay

## 2021-06-03 ENCOUNTER — Ambulatory Visit (HOSPITAL_BASED_OUTPATIENT_CLINIC_OR_DEPARTMENT_OTHER)
Admission: RE | Admit: 2021-06-03 | Discharge: 2021-06-03 | Disposition: A | Discharge status: Home / Self Care | Payer: Medicare Other | Source: Ambulatory Visit | Attending: Oncology | Admitting: Oncology

## 2021-06-03 ENCOUNTER — Other Ambulatory Visit: Payer: Self-pay | Admitting: Oncology

## 2021-06-03 ENCOUNTER — Telehealth: Payer: Self-pay

## 2021-06-03 DIAGNOSIS — C20 Malignant neoplasm of rectum: Secondary | ICD-10-CM | POA: Insufficient documentation

## 2021-06-03 LAB — BASIC METABOLIC PANEL - CANCER CENTER ONLY
Anion gap: 10 (ref 5–15)
BUN: 27 mg/dL — ABNORMAL HIGH (ref 8–23)
CO2: 27 mmol/L (ref 22–32)
Calcium: 9.5 mg/dL (ref 8.9–10.3)
Chloride: 102 mmol/L (ref 98–111)
Creatinine: 2.02 mg/dL — ABNORMAL HIGH (ref 0.61–1.24)
GFR, Estimated: 35 mL/min — ABNORMAL LOW (ref >60)
Glucose, Bld: 96 mg/dL (ref 70–99)
Potassium: 4.1 mmol/L (ref 3.5–5.1)
Sodium: 139 mmol/L (ref 135–145)

## 2021-06-03 LAB — CBC WITH DIFFERENTIAL (CANCER CENTER ONLY)
Abs Immature Granulocytes: 0.01 10*3/uL (ref 0.00–0.07)
Basophils Absolute: 0 10*3/uL (ref 0.0–0.1)
Basophils Relative: 1 %
Eosinophils Absolute: 0.1 10*3/uL (ref 0.0–0.5)
Eosinophils Relative: 2 %
HCT: 34.3 % — ABNORMAL LOW (ref 39.0–52.0)
Hemoglobin: 10.7 g/dL — ABNORMAL LOW (ref 13.0–17.0)
Immature Granulocytes: 0 %
Lymphocytes Relative: 19 %
Lymphs Abs: 0.9 10*3/uL (ref 0.7–4.0)
MCH: 26.6 pg (ref 26.0–34.0)
MCHC: 31.2 g/dL (ref 30.0–36.0)
MCV: 85.3 fL (ref 80.0–100.0)
Monocytes Absolute: 0.6 10*3/uL (ref 0.1–1.0)
Monocytes Relative: 13 %
Neutro Abs: 3 10*3/uL (ref 1.7–7.7)
Neutrophils Relative %: 65 %
Platelet Count: 281 10*3/uL (ref 150–400)
RBC: 4.02 MIL/uL — ABNORMAL LOW (ref 4.22–5.81)
RDW: 14.3 % (ref 11.5–15.5)
WBC Count: 4.7 10*3/uL (ref 4.0–10.5)
nRBC: 0 % (ref 0.0–0.2)

## 2021-06-03 LAB — CEA (ACCESS): CEA (CHCC): 4.31 ng/mL (ref 0.00–5.00)

## 2021-06-03 MED ORDER — HEPARIN SOD (PORK) LOCK FLUSH 100 UNIT/ML IV SOLN
500.0000 [IU] | Freq: Once | INTRAVENOUS | Status: AC
  Administered 2021-06-03: 500 [IU] via INTRAVENOUS

## 2021-06-03 NOTE — Telephone Encounter (Signed)
TC from [facility] radiology inquiring about CT scan order.radiology stated Pt creatinine level is to high for contrast Per Dr Benay Spice order can be changed to CT w/o contrast.

## 2021-06-07 ENCOUNTER — Other Ambulatory Visit: Payer: Medicare Other

## 2021-06-08 ENCOUNTER — Other Ambulatory Visit: Payer: Self-pay

## 2021-06-08 ENCOUNTER — Inpatient Hospital Stay (HOSPITAL_BASED_OUTPATIENT_CLINIC_OR_DEPARTMENT_OTHER): Payer: Medicare Other | Admitting: Oncology

## 2021-06-08 ENCOUNTER — Inpatient Hospital Stay: Payer: Medicare Other | Admitting: Oncology

## 2021-06-08 VITALS — BP 136/74 | HR 90 | Temp 98.1°F | Resp 20 | Ht 69.0 in | Wt 170.8 lb

## 2021-06-08 DIAGNOSIS — C20 Malignant neoplasm of rectum: Secondary | ICD-10-CM

## 2021-06-08 DIAGNOSIS — Z7901 Long term (current) use of anticoagulants: Secondary | ICD-10-CM | POA: Diagnosis not present

## 2021-06-08 DIAGNOSIS — G4730 Sleep apnea, unspecified: Secondary | ICD-10-CM | POA: Diagnosis not present

## 2021-06-08 DIAGNOSIS — N289 Disorder of kidney and ureter, unspecified: Secondary | ICD-10-CM | POA: Diagnosis not present

## 2021-06-08 DIAGNOSIS — I10 Essential (primary) hypertension: Secondary | ICD-10-CM | POA: Diagnosis not present

## 2021-06-08 DIAGNOSIS — R221 Localized swelling, mass and lump, neck: Secondary | ICD-10-CM | POA: Diagnosis not present

## 2021-06-08 DIAGNOSIS — I82C11 Acute embolism and thrombosis of right internal jugular vein: Secondary | ICD-10-CM | POA: Diagnosis not present

## 2021-06-08 DIAGNOSIS — G620 Drug-induced polyneuropathy: Secondary | ICD-10-CM | POA: Diagnosis not present

## 2021-06-08 DIAGNOSIS — Z85048 Personal history of other malignant neoplasm of rectum, rectosigmoid junction, and anus: Secondary | ICD-10-CM | POA: Diagnosis present

## 2021-06-08 DIAGNOSIS — Z87442 Personal history of urinary calculi: Secondary | ICD-10-CM | POA: Diagnosis not present

## 2021-06-08 NOTE — Progress Notes (Signed)
[place] OFFICE PROGRESS NOTE   Diagnosis: Rectal cancer  INTERVAL HISTORY:   Walter [name] returns as scheduled.  He was referred for an upper extremity Doppler on 05/28/2021.  This confirmed an acute DVT at the right internal jugular vein.  He began apixaban anticoagulation.  He reports improvement in the right neck swelling and pain.  No bleeding.  He is scheduled to see Dr. Morton Stall later today.  He underwent biopsy of the anal canal on 05/14/2021 with no malignancy seen. Walter [name] continues to have peripheral numbness. Objective:  Vital signs in last 24 hours:  Blood pressure 136/74, pulse 90, temperature 98.1 F (36.7 C), temperature source Oral, resp. rate 20, height 5\' 9"  (1.753 m), weight 170 lb 12.8 oz (77.5 kg), SpO2 100 %.    HEENT: Neck without mass Lymphatics: No cervical, supraclavicular, axillary, or inguinal nodes Resp: Lungs clear bilaterally Cardio: Regular rate and rhythm GI: No hepatosplenomegaly Vascular: No leg edema   Portacath/PICC-without erythema  Lab Results:  Lab Results  Component Value Date   WBC 4.7 06/03/2021   HGB 10.7 (L) 06/03/2021   HCT 34.3 (L) 06/03/2021   MCV 85.3 06/03/2021   PLT 281 06/03/2021   NEUTROABS 3.0 06/03/2021    CMP  Lab Results  Component Value Date   NA 139 06/03/2021   K 4.1 06/03/2021   CL 102 06/03/2021   CO2 27 06/03/2021   GLUCOSE 96 06/03/2021   BUN 27 (H) 06/03/2021   CREATININE 2.02 (H) 06/03/2021   CALCIUM 9.5 06/03/2021   PROT 6.5 03/17/2021   ALBUMIN 3.4 (L) 03/17/2021   AST 14 (L) 03/17/2021   ALT 10 03/17/2021   ALKPHOS 48 03/17/2021   BILITOT 0.3 03/17/2021   GFRNONAA 35 (L) 06/03/2021    Lab Results  Component Value Date   CEA1 5.84 (H) 03/17/2021   CEA 4.31 06/03/2021    Lab Results  Component Value Date   INR 1.3 (H) 11/08/2020   LABPROT 15.6 (H) 11/08/2020    Imaging:  No results found.  Medications: I have reviewed the patient's current  medications.   Assessment/Plan: Rectal cancer Rectal nodularity noted on colonoscopy 06/29/2020, removal of a rectal "polyp" revealed a tubulovillous adenoma with high-grade dysplasia Physical exam 08/04/2020-perianal mass invading the anal canal and sphincter complex-biopsy positive for mucinous adenocarcinoma CTs 08/17/2020-no rectal mass identified, multiple liver cyst and too small to characterize low-attenuation liver lesions, midline ventral hernia containing proximal right colon, groundglass left upper lobe nodule and nonspecific 3 mm right middle lobe nodule Cycle 1 FOLFOX 09/15/2020 MRI pelvis at [facility] 09/17/2020- anal and low rectal mass.  Internal and external sphincters involved by the mass.  Subtle extension of the tumor through the serosa of the low rectum.  No local regional adenopathy. MRI liver at [facility] 09/17/2020- scattered hepatic cysts.  No evidence of metastatic disease. 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  Restaging CTs revealed no evidence of residual/recurrent disease.  He will continue anal surveillance and pelvic MRI surveillance with Dr. Morton Stall.  I discussed the case with Dr. Morton Stall today.  Walter [name] will continue apixaban anticoagulation for the Port-A-Cath related DVT.  We will plan for 3 months of anticoagulation therapy and then arrange for Port-A-Cath removal.  Walter [name] will return for an office visit and Port-A-Cath flush in approximately 5 weeks.  Betsy Coder, MD  06/08/2021  9:40 AM

## 2021-06-09 ENCOUNTER — Encounter: Payer: Self-pay | Admitting: *Deleted

## 2021-06-09 NOTE — Progress Notes (Signed)
Completed patient assistance form for Eliquis from [place] and gave back to patient. Copy to HIM>

## 2021-07-01 ENCOUNTER — Other Ambulatory Visit (HOSPITAL_COMMUNITY): Payer: Self-pay

## 2021-07-02 ENCOUNTER — Other Ambulatory Visit (HOSPITAL_COMMUNITY): Payer: Self-pay

## 2021-07-12 ENCOUNTER — Ambulatory Visit: Payer: Medicare Other | Admitting: Nurse Practitioner

## 2021-07-13 ENCOUNTER — Inpatient Hospital Stay: Payer: Medicare Other

## 2021-07-13 ENCOUNTER — Other Ambulatory Visit: Payer: Self-pay

## 2021-07-13 ENCOUNTER — Inpatient Hospital Stay: Payer: Medicare Other | Admitting: Nurse Practitioner

## 2021-07-13 DIAGNOSIS — C20 Malignant neoplasm of rectum: Secondary | ICD-10-CM

## 2021-07-13 NOTE — Progress Notes (Signed)
Order review for labs for visit 07/19/2021

## 2021-07-19 ENCOUNTER — Inpatient Hospital Stay (HOSPITAL_BASED_OUTPATIENT_CLINIC_OR_DEPARTMENT_OTHER): Payer: Medicare Other | Admitting: Nurse Practitioner

## 2021-07-19 ENCOUNTER — Other Ambulatory Visit (HOSPITAL_COMMUNITY): Payer: Self-pay

## 2021-07-19 ENCOUNTER — Inpatient Hospital Stay: Payer: Medicare Other

## 2021-07-19 ENCOUNTER — Encounter: Payer: Self-pay | Admitting: Nurse Practitioner

## 2021-07-19 ENCOUNTER — Inpatient Hospital Stay: Payer: Medicare Other | Attending: Oncology

## 2021-07-19 ENCOUNTER — Other Ambulatory Visit: Payer: Self-pay

## 2021-07-19 VITALS — BP 128/69 | HR 75 | Temp 97.8°F | Resp 18 | Ht 69.0 in | Wt 173.8 lb

## 2021-07-19 DIAGNOSIS — M542 Cervicalgia: Secondary | ICD-10-CM | POA: Diagnosis not present

## 2021-07-19 DIAGNOSIS — Z923 Personal history of irradiation: Secondary | ICD-10-CM | POA: Insufficient documentation

## 2021-07-19 DIAGNOSIS — Z452 Encounter for adjustment and management of vascular access device: Secondary | ICD-10-CM | POA: Insufficient documentation

## 2021-07-19 DIAGNOSIS — I82C11 Acute embolism and thrombosis of right internal jugular vein: Secondary | ICD-10-CM | POA: Insufficient documentation

## 2021-07-19 DIAGNOSIS — C20 Malignant neoplasm of rectum: Secondary | ICD-10-CM

## 2021-07-19 DIAGNOSIS — I10 Essential (primary) hypertension: Secondary | ICD-10-CM | POA: Diagnosis not present

## 2021-07-19 DIAGNOSIS — N289 Disorder of kidney and ureter, unspecified: Secondary | ICD-10-CM | POA: Diagnosis not present

## 2021-07-19 DIAGNOSIS — G620 Drug-induced polyneuropathy: Secondary | ICD-10-CM | POA: Insufficient documentation

## 2021-07-19 DIAGNOSIS — Z85048 Personal history of other malignant neoplasm of rectum, rectosigmoid junction, and anus: Secondary | ICD-10-CM | POA: Insufficient documentation

## 2021-07-19 DIAGNOSIS — Z87442 Personal history of urinary calculi: Secondary | ICD-10-CM | POA: Diagnosis not present

## 2021-07-19 DIAGNOSIS — Z95828 Presence of other vascular implants and grafts: Secondary | ICD-10-CM

## 2021-07-19 DIAGNOSIS — G4730 Sleep apnea, unspecified: Secondary | ICD-10-CM | POA: Diagnosis not present

## 2021-07-19 LAB — BASIC METABOLIC PANEL - CANCER CENTER ONLY
Anion gap: 7 (ref 5–15)
BUN: 30 mg/dL — ABNORMAL HIGH (ref 8–23)
CO2: 28 mmol/L (ref 22–32)
Calcium: 9.4 mg/dL (ref 8.9–10.3)
Chloride: 106 mmol/L (ref 98–111)
Creatinine: 1.99 mg/dL — ABNORMAL HIGH (ref 0.61–1.24)
GFR, Estimated: 35 mL/min — ABNORMAL LOW (ref >60)
Glucose, Bld: 85 mg/dL (ref 70–99)
Potassium: 4.1 mmol/L (ref 3.5–5.1)
Sodium: 141 mmol/L (ref 135–145)

## 2021-07-19 LAB — CBC WITH DIFFERENTIAL (CANCER CENTER ONLY)
Abs Immature Granulocytes: 0.01 10*3/uL (ref 0.00–0.07)
Basophils Absolute: 0 10*3/uL (ref 0.0–0.1)
Basophils Relative: 1 %
Eosinophils Absolute: 0.1 10*3/uL (ref 0.0–0.5)
Eosinophils Relative: 2 %
HCT: 36.8 % — ABNORMAL LOW (ref 39.0–52.0)
Hemoglobin: 11.4 g/dL — ABNORMAL LOW (ref 13.0–17.0)
Immature Granulocytes: 0 %
Lymphocytes Relative: 23 %
Lymphs Abs: 1 10*3/uL (ref 0.7–4.0)
MCH: 25.6 pg — ABNORMAL LOW (ref 26.0–34.0)
MCHC: 31 g/dL (ref 30.0–36.0)
MCV: 82.5 fL (ref 80.0–100.0)
Monocytes Absolute: 0.6 10*3/uL (ref 0.1–1.0)
Monocytes Relative: 13 %
Neutro Abs: 2.8 10*3/uL (ref 1.7–7.7)
Neutrophils Relative %: 61 %
Platelet Count: 227 10*3/uL (ref 150–400)
RBC: 4.46 MIL/uL (ref 4.22–5.81)
RDW: 15.8 % — ABNORMAL HIGH (ref 11.5–15.5)
WBC Count: 4.5 10*3/uL (ref 4.0–10.5)
nRBC: 0 % (ref 0.0–0.2)

## 2021-07-19 MED ORDER — HEPARIN SOD (PORK) LOCK FLUSH 100 UNIT/ML IV SOLN
500.0000 [IU] | Freq: Once | INTRAVENOUS | Status: AC
  Administered 2021-07-19: 500 [IU]

## 2021-07-19 MED ORDER — SODIUM CHLORIDE 0.9% FLUSH
10.0000 mL | Freq: Once | INTRAVENOUS | Status: AC
  Administered 2021-07-19: 10 mL

## 2021-07-19 NOTE — Progress Notes (Signed)
[place] OFFICE PROGRESS NOTE   Diagnosis: Rectal cancer  INTERVAL HISTORY:   Walter [name] returns as scheduled.  He notes some burning/itching with bowel movements.  Symptoms feel like hemorrhoids to him.  He has occasional "minor" blood streaks on the toilet tissue after a bowel movement.  Neck pain continues to be improved.  He completed 30 days of Eliquis and then discontinued due to the cost.  He has been taking aspirin.  Objective:  Vital signs in last 24 hours:  Blood pressure 128/69, pulse 75, temperature 97.8 F (36.6 C), temperature source Oral, resp. rate 18, height 5\' 9"  (1.753 m), weight 173 lb 12.8 oz (78.8 kg), SpO2 100 %.    HEENT: Neck without mass. Lymphatics: No palpable cervical, supraclavicular, axillary or inguinal lymph nodes. Resp: Lungs clear bilaterally. Cardio: Regular rate and rhythm. GI: Abdomen soft and nontender.  No hepatomegaly. Vascular: No leg edema.   Lab Results:  Lab Results  Component Value Date   WBC 4.5 07/19/2021   HGB 11.4 (L) 07/19/2021   HCT 36.8 (L) 07/19/2021   MCV 82.5 07/19/2021   PLT 227 07/19/2021   NEUTROABS 2.8 07/19/2021    Imaging:  No results found.  Medications: I have reviewed the patient's current medications.  Assessment/Plan: Rectal cancer Rectal nodularity noted on colonoscopy 06/29/2020, removal of a rectal "polyp" revealed a tubulovillous adenoma with high-grade dysplasia Physical exam 08/04/2020-perianal mass invading the anal canal and sphincter complex-biopsy positive for mucinous adenocarcinoma CTs 08/17/2020-no rectal mass identified, multiple liver cyst and too small to characterize low-attenuation liver lesions, midline ventral hernia containing proximal right colon, groundglass left upper lobe nodule and nonspecific 3 mm right middle lobe nodule Cycle 1 FOLFOX 09/15/2020 MRI pelvis at [facility] 09/17/2020- anal and low rectal mass.  Internal and external sphincters involved by the  mass.  Subtle extension of the tumor through the serosa of the low rectum.  No local regional adenopathy. MRI liver at [facility] 09/17/2020- scattered hepatic cysts.  No evidence of metastatic disease. 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 coli UTI/bacteremia Oxaliplatin neuropathy, persistent peripheral tingling following cycle 7 FOLFOX, progressive 02/05/2021- Cymbalta started, discontinued due to lack of efficacy, neuropathy improved 05/03/2021 Right IJ DVT 05/28/2021-apixaban      Disposition: Walter [name] appears stable.  He remains in clinical remission from rectal cancer.  He continues anal/rectal surveillance and pelvic MRI surveillance with Dr. Morton Stall, next planned appointment is in November.  He was diagnosed with a right IJ DVT 05/28/2021.  He completed 1 month of Eliquis, subsequently discontinued due to the cost.  We were able to secure a 2-week supply for him today and will contact the pharmacy to see what other options there might be to obtain additional Eliquis for him.  He will return for lab and  follow-up in 5  to 6 weeks.  We are available to see him sooner if needed.    Ned Card ANP/GNP-BC   07/19/2021  1:54 PM

## 2021-07-26 ENCOUNTER — Other Ambulatory Visit (HOSPITAL_COMMUNITY): Payer: Self-pay

## 2021-07-26 ENCOUNTER — Encounter: Payer: Self-pay | Admitting: Oncology

## 2021-07-30 ENCOUNTER — Telehealth: Payer: Self-pay

## 2021-07-30 NOTE — Telephone Encounter (Signed)
Return call to Pt  inquiring about Eliquis prescription. Pt is in need of financial support with this prescription. Informed Pt that this medication can be switched to a different brand which will be less expensive. Pt inquired how much longer does he have to be on this medication informed Pt he has to be on the medication for 3 months. Also informed Pt we are waiting for a return response from Dr. Benay Spice to see if medication can be switched. Asked Pt how much medication does he have left Pt stated he has medication until Monday.

## 2021-08-02 ENCOUNTER — Telehealth: Payer: Self-pay

## 2021-08-02 ENCOUNTER — Other Ambulatory Visit: Payer: Self-pay | Admitting: Nurse Practitioner

## 2021-08-02 DIAGNOSIS — I82621 Acute embolism and thrombosis of deep veins of right upper extremity: Secondary | ICD-10-CM

## 2021-08-02 DIAGNOSIS — C20 Malignant neoplasm of rectum: Secondary | ICD-10-CM

## 2021-08-02 MED ORDER — WARFARIN SODIUM 5 MG PO TABS: 5.0000 mg | ORAL_TABLET | Freq: Every day | ORAL | 0 refills | Status: DC

## 2021-08-02 NOTE — Telephone Encounter (Signed)
TC from Pt inquiring about prescription for Eliquis. Informed Pt he had options to either pay for the Eliquis or change prescription to lovenox injections or coumadin which he would have to come to the office for lab test to see if his PT/INR are in range. Pt decided to take the coumadin and will come in tomorrow for lab tests.Prescription sent to the pharmacy.

## 2021-08-03 ENCOUNTER — Telehealth: Payer: Self-pay

## 2021-08-03 ENCOUNTER — Other Ambulatory Visit: Payer: Self-pay

## 2021-08-03 ENCOUNTER — Inpatient Hospital Stay: Payer: Medicare Other | Attending: Oncology

## 2021-08-03 DIAGNOSIS — Z86718 Personal history of other venous thrombosis and embolism: Secondary | ICD-10-CM | POA: Diagnosis not present

## 2021-08-03 DIAGNOSIS — Z85048 Personal history of other malignant neoplasm of rectum, rectosigmoid junction, and anus: Secondary | ICD-10-CM | POA: Insufficient documentation

## 2021-08-03 DIAGNOSIS — I82621 Acute embolism and thrombosis of deep veins of right upper extremity: Secondary | ICD-10-CM

## 2021-08-03 DIAGNOSIS — C20 Malignant neoplasm of rectum: Secondary | ICD-10-CM

## 2021-08-03 LAB — PROTIME-INR
INR: 1.1 (ref 0.8–1.2)
Prothrombin Time: 14.4 seconds (ref 11.4–15.2)

## 2021-08-03 NOTE — Telephone Encounter (Signed)
Spoke with Mrs. Newell Coral to let her know a prescription was sent to the pharmacy for Coumadin 5 mg daily. He needs to be put up and Mr. Caggiano can go ahead and begin Coumadin. Also we went over his appointment for the 08-06-2021

## 2021-08-04 ENCOUNTER — Other Ambulatory Visit: Payer: Self-pay

## 2021-08-04 DIAGNOSIS — I82621 Acute embolism and thrombosis of deep veins of right upper extremity: Secondary | ICD-10-CM

## 2021-08-06 ENCOUNTER — Other Ambulatory Visit: Payer: Self-pay

## 2021-08-06 ENCOUNTER — Inpatient Hospital Stay: Payer: Medicare Other

## 2021-08-06 ENCOUNTER — Telehealth: Payer: Self-pay | Admitting: Nurse Practitioner

## 2021-08-06 DIAGNOSIS — Z85048 Personal history of other malignant neoplasm of rectum, rectosigmoid junction, and anus: Secondary | ICD-10-CM | POA: Diagnosis not present

## 2021-08-06 DIAGNOSIS — I82621 Acute embolism and thrombosis of deep veins of right upper extremity: Secondary | ICD-10-CM

## 2021-08-06 DIAGNOSIS — C20 Malignant neoplasm of rectum: Secondary | ICD-10-CM

## 2021-08-06 LAB — PROTIME-INR
INR: 1.1 (ref 0.8–1.2)
Prothrombin Time: 14.2 seconds (ref 11.4–15.2)

## 2021-08-06 NOTE — Telephone Encounter (Signed)
I talked with Walter [name] regarding the PT/INR result from today.  He began Coumadin 5 mg daily 08/03/2021.  Level is subtherapeutic.  He will continue Coumadin at the current dose, repeat PT/INR 08/09/2021.  He agrees with this plan.

## 2021-08-09 ENCOUNTER — Inpatient Hospital Stay: Payer: Medicare Other

## 2021-08-09 ENCOUNTER — Other Ambulatory Visit: Payer: Self-pay

## 2021-08-09 ENCOUNTER — Other Ambulatory Visit: Payer: Self-pay | Admitting: Nurse Practitioner

## 2021-08-09 DIAGNOSIS — I82621 Acute embolism and thrombosis of deep veins of right upper extremity: Secondary | ICD-10-CM

## 2021-08-09 DIAGNOSIS — C20 Malignant neoplasm of rectum: Secondary | ICD-10-CM

## 2021-08-09 DIAGNOSIS — Z85048 Personal history of other malignant neoplasm of rectum, rectosigmoid junction, and anus: Secondary | ICD-10-CM | POA: Diagnosis not present

## 2021-08-09 LAB — PROTIME-INR
INR: 1.7 — ABNORMAL HIGH (ref 0.8–1.2)
Prothrombin Time: 20 seconds — ABNORMAL HIGH (ref 11.4–15.2)

## 2021-08-10 ENCOUNTER — Telehealth: Payer: Self-pay | Admitting: *Deleted

## 2021-08-10 NOTE — Telephone Encounter (Signed)
Notified patient of INR 1.7 and to continue warfarin at 5 mg daily and recheck on 08/16/21 at 0930.  He reports a friend of his who's husband was his former PCP has obtained patient assistance form for Eliquis and it should be coming to office soon. Informed him that he will be called when it is received.

## 2021-08-16 ENCOUNTER — Other Ambulatory Visit: Payer: Self-pay

## 2021-08-16 ENCOUNTER — Telehealth: Payer: Self-pay | Admitting: *Deleted

## 2021-08-16 ENCOUNTER — Inpatient Hospital Stay: Payer: Medicare Other

## 2021-08-16 ENCOUNTER — Telehealth: Payer: Self-pay | Admitting: Pharmacy Technician

## 2021-08-16 DIAGNOSIS — C20 Malignant neoplasm of rectum: Secondary | ICD-10-CM

## 2021-08-16 DIAGNOSIS — I82621 Acute embolism and thrombosis of deep veins of right upper extremity: Secondary | ICD-10-CM

## 2021-08-16 DIAGNOSIS — Z85048 Personal history of other malignant neoplasm of rectum, rectosigmoid junction, and anus: Secondary | ICD-10-CM | POA: Diagnosis not present

## 2021-08-16 LAB — PROTIME-INR
INR: 2.5 — ABNORMAL HIGH (ref 0.8–1.2)
Prothrombin Time: 27.2 seconds — ABNORMAL HIGH (ref 11.4–15.2)

## 2021-08-16 NOTE — Telephone Encounter (Addendum)
Opened in error

## 2021-08-16 NOTE — Telephone Encounter (Addendum)
Error

## 2021-08-16 NOTE — Telephone Encounter (Signed)
Informed patient of therapeutic INR of 2.5 and to continue warfarin 5 mg daily. Will re-check on 12/8 with next office visit. Informed him we have not received the form he spoke about for patient assistance. Will also ask for our oral oncology navigation clinic to look for an application for the Eliquis.

## 2021-08-16 NOTE — Telephone Encounter (Signed)
-----   Message from Owens Shark, NP sent at 08/16/2021  2:39 PM EST ----- Please let him know PT/INR is in therapeutic range.  Continue current dose of Coumadin.  Please schedule him for a repeat PT/INR in 10 days.

## 2021-08-25 ENCOUNTER — Other Ambulatory Visit: Payer: Self-pay | Admitting: Nurse Practitioner

## 2021-08-25 DIAGNOSIS — I82621 Acute embolism and thrombosis of deep veins of right upper extremity: Secondary | ICD-10-CM

## 2021-08-25 DIAGNOSIS — C20 Malignant neoplasm of rectum: Secondary | ICD-10-CM

## 2021-08-26 ENCOUNTER — Other Ambulatory Visit: Payer: Self-pay

## 2021-08-26 ENCOUNTER — Inpatient Hospital Stay: Payer: Medicare Other

## 2021-08-26 ENCOUNTER — Encounter: Payer: Self-pay | Admitting: Oncology

## 2021-08-26 ENCOUNTER — Inpatient Hospital Stay: Payer: Medicare Other | Attending: Oncology | Admitting: Oncology

## 2021-08-26 VITALS — BP 136/76 | HR 75 | Temp 98.2°F | Resp 18 | Ht 69.0 in | Wt 171.4 lb

## 2021-08-26 DIAGNOSIS — Z8547 Personal history of malignant neoplasm of testis: Secondary | ICD-10-CM | POA: Insufficient documentation

## 2021-08-26 DIAGNOSIS — C20 Malignant neoplasm of rectum: Secondary | ICD-10-CM

## 2021-08-26 DIAGNOSIS — E611 Iron deficiency: Secondary | ICD-10-CM | POA: Insufficient documentation

## 2021-08-26 DIAGNOSIS — Z86718 Personal history of other venous thrombosis and embolism: Secondary | ICD-10-CM | POA: Diagnosis not present

## 2021-08-26 DIAGNOSIS — Z923 Personal history of irradiation: Secondary | ICD-10-CM | POA: Diagnosis not present

## 2021-08-26 DIAGNOSIS — G4730 Sleep apnea, unspecified: Secondary | ICD-10-CM | POA: Insufficient documentation

## 2021-08-26 DIAGNOSIS — N2889 Other specified disorders of kidney and ureter: Secondary | ICD-10-CM | POA: Diagnosis not present

## 2021-08-26 DIAGNOSIS — I10 Essential (primary) hypertension: Secondary | ICD-10-CM | POA: Diagnosis not present

## 2021-08-26 DIAGNOSIS — Z85048 Personal history of other malignant neoplasm of rectum, rectosigmoid junction, and anus: Secondary | ICD-10-CM | POA: Insufficient documentation

## 2021-08-26 DIAGNOSIS — Z87442 Personal history of urinary calculi: Secondary | ICD-10-CM | POA: Insufficient documentation

## 2021-08-26 DIAGNOSIS — I82621 Acute embolism and thrombosis of deep veins of right upper extremity: Secondary | ICD-10-CM | POA: Diagnosis not present

## 2021-08-26 DIAGNOSIS — Z95828 Presence of other vascular implants and grafts: Secondary | ICD-10-CM

## 2021-08-26 DIAGNOSIS — G620 Drug-induced polyneuropathy: Secondary | ICD-10-CM | POA: Insufficient documentation

## 2021-08-26 LAB — CBC WITH DIFFERENTIAL (CANCER CENTER ONLY)
Abs Immature Granulocytes: 0.01 10*3/uL (ref 0.00–0.07)
Basophils Absolute: 0 10*3/uL (ref 0.0–0.1)
Basophils Relative: 0 %
Eosinophils Absolute: 0.1 10*3/uL (ref 0.0–0.5)
Eosinophils Relative: 2 %
HCT: 37.3 % — ABNORMAL LOW (ref 39.0–52.0)
Hemoglobin: 11.7 g/dL — ABNORMAL LOW (ref 13.0–17.0)
Immature Granulocytes: 0 %
Lymphocytes Relative: 23 %
Lymphs Abs: 1.1 10*3/uL (ref 0.7–4.0)
MCH: 25.8 pg — ABNORMAL LOW (ref 26.0–34.0)
MCHC: 31.4 g/dL (ref 30.0–36.0)
MCV: 82.3 fL (ref 80.0–100.0)
Monocytes Absolute: 0.7 10*3/uL (ref 0.1–1.0)
Monocytes Relative: 14 %
Neutro Abs: 2.8 10*3/uL (ref 1.7–7.7)
Neutrophils Relative %: 61 %
Platelet Count: 222 10*3/uL (ref 150–400)
RBC: 4.53 MIL/uL (ref 4.22–5.81)
RDW: 16.3 % — ABNORMAL HIGH (ref 11.5–15.5)
WBC Count: 4.6 10*3/uL (ref 4.0–10.5)
nRBC: 0 % (ref 0.0–0.2)

## 2021-08-26 LAB — CEA (ACCESS): CEA (CHCC): 4.08 ng/mL (ref 0.00–5.00)

## 2021-08-26 LAB — BASIC METABOLIC PANEL - CANCER CENTER ONLY
Anion gap: 7 (ref 5–15)
BUN: 33 mg/dL — ABNORMAL HIGH (ref 8–23)
CO2: 28 mmol/L (ref 22–32)
Calcium: 9.2 mg/dL (ref 8.9–10.3)
Chloride: 106 mmol/L (ref 98–111)
Creatinine: 2.07 mg/dL — ABNORMAL HIGH (ref 0.61–1.24)
GFR, Estimated: 34 mL/min — ABNORMAL LOW (ref >60)
Glucose, Bld: 86 mg/dL (ref 70–99)
Potassium: 3.7 mmol/L (ref 3.5–5.1)
Sodium: 141 mmol/L (ref 135–145)

## 2021-08-26 LAB — FERRITIN: Ferritin: 42 ng/mL (ref 24–336)

## 2021-08-26 MED ORDER — HEPARIN SOD (PORK) LOCK FLUSH 100 UNIT/ML IV SOLN
500.0000 [IU] | Freq: Once | INTRAVENOUS | Status: AC
  Administered 2021-08-26: 500 [IU]

## 2021-08-26 MED ORDER — SODIUM CHLORIDE 0.9% FLUSH
10.0000 mL | Freq: Once | INTRAVENOUS | Status: AC
  Administered 2021-08-26: 10 mL

## 2021-08-26 NOTE — Progress Notes (Signed)
[place] OFFICE PROGRESS NOTE   Diagnosis: Rectal cancer  INTERVAL HISTORY:   Mr. Wilson returns as scheduled.  He feels well.  He continues to have neuropathy symptoms in the extremities.  This has improved and does not interfere as much with activity.  He began Coumadin anticoagulation 08/03/2021.  He has occasional rectal bleeding.  His bowel movements are soft. He saw Dr. Morton Stall 08/03/2021.  There was no evidence of progressive rectal cancer.  A rectal MRI 07/29/2021 revealed decreased size and appearance of anal mass with no lower rectal component.  Objective:  Vital signs in last 24 hours:  Blood pressure 136/76, pulse 75, temperature 98.2 F (36.8 C), temperature source Oral, resp. rate 18, height 5\' 9"  (1.753 m), weight 171 lb 6.4 oz (77.7 kg), SpO2 100 %.    HEENT: Neck without swelling Lymphatics: No cervical, supraclavicular, axillary, or inguinal nodes Resp: Lungs clear bilaterally Cardio: Regular rate and rhythm GI: No hepatosplenomegaly, nontender, no mass Vascular: No leg edema, left lower leg is larger than the right side    Portacath/PICC-without erythema  Lab Results:  Lab Results  Component Value Date   WBC 4.6 08/26/2021   HGB 11.7 (L) 08/26/2021   HCT 37.3 (L) 08/26/2021   MCV 82.3 08/26/2021   PLT 222 08/26/2021   NEUTROABS 2.8 08/26/2021    CMP  Lab Results  Component Value Date   NA 141 08/26/2021   K 3.7 08/26/2021   CL 106 08/26/2021   CO2 28 08/26/2021   GLUCOSE 86 08/26/2021   BUN 33 (H) 08/26/2021   CREATININE 2.07 (H) 08/26/2021   CALCIUM 9.2 08/26/2021   PROT 6.5 03/17/2021   ALBUMIN 3.4 (L) 03/17/2021   AST 14 (L) 03/17/2021   ALT 10 03/17/2021   ALKPHOS 48 03/17/2021   BILITOT 0.3 03/17/2021   GFRNONAA 34 (L) 08/26/2021    Lab Results  Component Value Date   CEA1 5.84 (H) 03/17/2021   CEA 4.08 08/26/2021  PT/INR on 08/16/2021: 2.5  Medications: I have reviewed the patient's current  medications.   Assessment/Plan: Rectal cancer Rectal nodularity noted on colonoscopy 06/29/2020, removal of a rectal "polyp" revealed a tubulovillous adenoma with high-grade dysplasia Physical exam 08/04/2020-perianal mass invading the anal canal and sphincter complex-biopsy positive for mucinous adenocarcinoma CTs 08/17/2020-no rectal mass identified, multiple liver cyst and too small to characterize low-attenuation liver lesions, midline ventral hernia containing proximal right colon, groundglass left upper lobe nodule and nonspecific 3 mm right middle lobe nodule Cycle 1 FOLFOX 09/15/2020 MRI pelvis at [facility] 09/17/2020- anal and low rectal mass.  Internal and external sphincters involved by the mass.  Subtle extension of the tumor through the serosa of the low rectum.  No local regional adenopathy. MRI liver at [facility] 09/17/2020- scattered hepatic cysts.  No evidence of metastatic disease. Cycle 2 FOLFOX 09/29/2020 Cycle 3 FOLFOX 10/13/2020 Cycle 4 FOLFOX 10/27/2020-Udenyca Cycle 5 FOLFOX 11/19/2020-Udenyca Cycle 6 FOLFOX 12/02/2020-Udenyca Cycle 7 FOLFOX 12/15/2020-Udenyca Cycle 8 FOLFOX 12/29/2020 -oxaliplatin, 5-FU bolus, and Udenyca held Radiation/Xeloda 01/11/2021-02/18/2021 04/08/2021-rectal exam/anoscopy-no residual tumor seen Anal canal biopsies 05/14/2021-fibrosis and reactive changes, negative for dysplasia or malignancy CTs 06/03/2021-no rectal mass, no evidence of metastatic disease, unchanged left upper lobe nodule MRI rectum 07/29/2021-decrease size of anal mass, no rectal component Renal insufficiency 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 coli  UTI/bacteremia Oxaliplatin neuropathy, persistent peripheral tingling following cycle 7 FOLFOX, progressive 02/05/2021- Cymbalta started, discontinued due to lack of efficacy, neuropathy improved 05/03/2021, improved 08/26/2021 Right IJ DVT 05/28/2021-apixaban, changed to Coumadin 08/03/2021        Disposition: Mr. Delcarlo is in clinical remission from rectal cancer. He will continue a surveillance program.  He is scheduled to see Dr. Morton Stall in 3 months.  He will be scheduled for surveillance CTs in March 2023.  The PT/INR was therapeutic on 08/16/2021.  He continues Coumadin anticoagulation.  He would like to consider removal of the Port-A-Cath after the holidays.  He will remain on Coumadin while the Port-A-Cath is in place.  He will return for a PT/INR on 09/02/2021.  He will be scheduled for an office visit and Port-A-Cath flush in 6 weeks. Betsy Coder, MD  08/26/2021  10:28 AM

## 2021-08-26 NOTE — Patient Instructions (Signed)

## 2021-09-02 ENCOUNTER — Inpatient Hospital Stay: Payer: Medicare Other

## 2021-09-02 ENCOUNTER — Telehealth: Payer: Self-pay

## 2021-09-02 ENCOUNTER — Other Ambulatory Visit: Payer: Self-pay

## 2021-09-02 DIAGNOSIS — Z85048 Personal history of other malignant neoplasm of rectum, rectosigmoid junction, and anus: Secondary | ICD-10-CM | POA: Diagnosis not present

## 2021-09-02 DIAGNOSIS — C20 Malignant neoplasm of rectum: Secondary | ICD-10-CM

## 2021-09-02 DIAGNOSIS — I82621 Acute embolism and thrombosis of deep veins of right upper extremity: Secondary | ICD-10-CM

## 2021-09-02 LAB — PROTIME-INR
INR: 2.5 — ABNORMAL HIGH (ref 0.8–1.2)
Prothrombin Time: 26.6 seconds — ABNORMAL HIGH (ref 11.4–15.2)

## 2021-09-02 NOTE — Telephone Encounter (Signed)
-----   Message from Ladell Pier, MD sent at 09/02/2021  1:34 PM EST ----- Please call patient, continue Coumadin same dose, follow-up 10/07/2021 as scheduled, repeat PT/INR in 3 weeks

## 2021-09-02 NOTE — Telephone Encounter (Signed)
Pt verbalized understanding.

## 2021-09-24 ENCOUNTER — Other Ambulatory Visit: Payer: Self-pay | Admitting: Oncology

## 2021-09-24 DIAGNOSIS — I82621 Acute embolism and thrombosis of deep veins of right upper extremity: Secondary | ICD-10-CM

## 2021-09-24 DIAGNOSIS — C20 Malignant neoplasm of rectum: Secondary | ICD-10-CM

## 2021-09-30 IMAGING — DX DG CHEST 1V PORT
1 series · 1 of 1 positions shown · non-contrast
Comparison: 02/24/2021

CLINICAL DATA: Sore throat with cough and fever.

EXAM:
PORTABLE CHEST 1 VIEW

[chest ap]
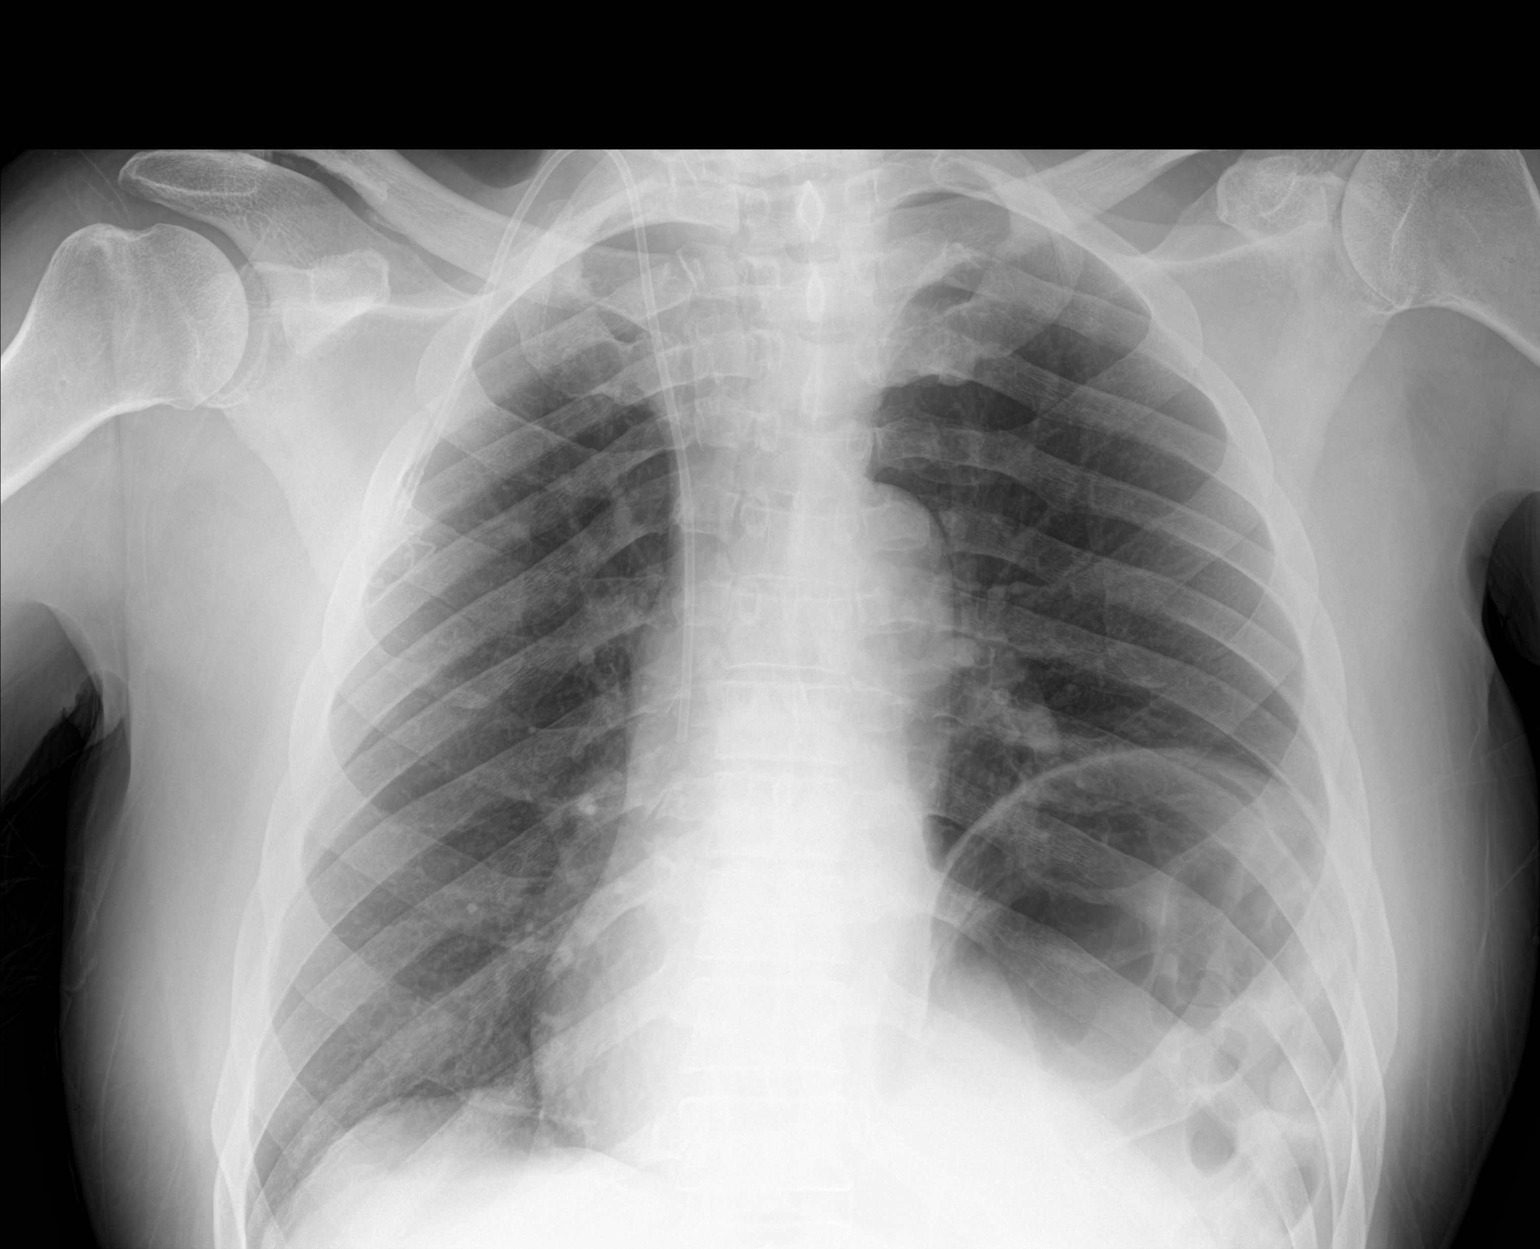

[1 of 1 positions shown; findings below may reference images not displayed]

FINDINGS: Stable asymmetric elevation left hemidiaphragm with similar
appearance of gas filled bowel underlying. The lungs are clear
without focal pneumonia, edema, pneumothorax or pleural effusion.
Right Port-A-Cath again noted. The visualized bony structures of the
thorax show no acute abnormality.
IMPRESSION: Stable.  No acute cardiopulmonary findings.

## 2021-10-04 ENCOUNTER — Encounter: Payer: Self-pay | Admitting: Nurse Practitioner

## 2021-10-04 ENCOUNTER — Telehealth: Payer: Self-pay

## 2021-10-04 ENCOUNTER — Inpatient Hospital Stay: Payer: Medicare Other | Attending: Oncology | Admitting: Nurse Practitioner

## 2021-10-04 ENCOUNTER — Inpatient Hospital Stay: Payer: Medicare Other

## 2021-10-04 ENCOUNTER — Other Ambulatory Visit: Payer: Self-pay

## 2021-10-04 VITALS — BP 131/70 | HR 82 | Temp 98.0°F | Resp 20 | Ht 69.0 in | Wt 174.6 lb

## 2021-10-04 DIAGNOSIS — I10 Essential (primary) hypertension: Secondary | ICD-10-CM | POA: Diagnosis not present

## 2021-10-04 DIAGNOSIS — Z87442 Personal history of urinary calculi: Secondary | ICD-10-CM | POA: Diagnosis not present

## 2021-10-04 DIAGNOSIS — I82621 Acute embolism and thrombosis of deep veins of right upper extremity: Secondary | ICD-10-CM

## 2021-10-04 DIAGNOSIS — Z95828 Presence of other vascular implants and grafts: Secondary | ICD-10-CM | POA: Diagnosis not present

## 2021-10-04 DIAGNOSIS — K439 Ventral hernia without obstruction or gangrene: Secondary | ICD-10-CM | POA: Diagnosis not present

## 2021-10-04 DIAGNOSIS — Z86718 Personal history of other venous thrombosis and embolism: Secondary | ICD-10-CM | POA: Insufficient documentation

## 2021-10-04 DIAGNOSIS — Z8547 Personal history of malignant neoplasm of testis: Secondary | ICD-10-CM | POA: Insufficient documentation

## 2021-10-04 DIAGNOSIS — C20 Malignant neoplasm of rectum: Secondary | ICD-10-CM | POA: Insufficient documentation

## 2021-10-04 DIAGNOSIS — K7689 Other specified diseases of liver: Secondary | ICD-10-CM | POA: Insufficient documentation

## 2021-10-04 DIAGNOSIS — N289 Disorder of kidney and ureter, unspecified: Secondary | ICD-10-CM | POA: Diagnosis not present

## 2021-10-04 DIAGNOSIS — Z79899 Other long term (current) drug therapy: Secondary | ICD-10-CM | POA: Diagnosis not present

## 2021-10-04 DIAGNOSIS — G4730 Sleep apnea, unspecified: Secondary | ICD-10-CM | POA: Insufficient documentation

## 2021-10-04 DIAGNOSIS — Z923 Personal history of irradiation: Secondary | ICD-10-CM | POA: Insufficient documentation

## 2021-10-04 LAB — BASIC METABOLIC PANEL - CANCER CENTER ONLY
Anion gap: 8 (ref 5–15)
BUN: 35 mg/dL — ABNORMAL HIGH (ref 8–23)
CO2: 28 mmol/L (ref 22–32)
Calcium: 9.2 mg/dL (ref 8.9–10.3)
Chloride: 101 mmol/L (ref 98–111)
Creatinine: 2.18 mg/dL — ABNORMAL HIGH (ref 0.61–1.24)
GFR, Estimated: 32 mL/min — ABNORMAL LOW (ref >60)
Glucose, Bld: 87 mg/dL (ref 70–99)
Potassium: 4.3 mmol/L (ref 3.5–5.1)
Sodium: 137 mmol/L (ref 135–145)

## 2021-10-04 LAB — PROTIME-INR
INR: 2.5 — ABNORMAL HIGH (ref 0.8–1.2)
Prothrombin Time: 27.3 seconds — ABNORMAL HIGH (ref 11.4–15.2)

## 2021-10-04 LAB — CEA (ACCESS): CEA (CHCC): 5.33 ng/mL — ABNORMAL HIGH (ref 0.00–5.00)

## 2021-10-04 NOTE — Progress Notes (Signed)
[place] OFFICE PROGRESS NOTE   Diagnosis: Rectal cancer  INTERVAL HISTORY:   Mr. Walter [name] returns as scheduled.  Bowels continue to be somewhat erratic.  No pain with bowel movements.  Occasional rectal bleeding.  Intermittently notes itching and burning at the rectum.  He has a good appetite.  No nausea or vomiting.  Objective:  Vital signs in last 24 hours:  Blood pressure 131/70, pulse 82, temperature 98 F (36.7 C), temperature source Oral, resp. rate 20, height 5\' 9"  (1.753 m), weight 174 lb 9.6 oz (79.2 kg), SpO2 100 %.    Lymphatics: No palpable cervical, supraclavicular, axillary or inguinal lymph nodes. Resp: Lungs clear bilaterally. Cardio: Regular rate and rhythm. GI: Abdomen soft and nontender.  No hepatosplenomegaly.  No mass. Vascular: No leg edema.  Left lower leg is slightly larger than the right lower leg. Port-A-Cath without erythema.  Lab Results:  Lab Results  Component Value Date   WBC 4.6 08/26/2021   HGB 11.7 (L) 08/26/2021   HCT 37.3 (L) 08/26/2021   MCV 82.3 08/26/2021   PLT 222 08/26/2021   NEUTROABS 2.8 08/26/2021    Imaging:  No results found.  Medications: I have reviewed the patient's current medications.  Assessment/Plan: Rectal cancer Rectal nodularity noted on colonoscopy 06/29/2020, removal of a rectal "polyp" revealed a tubulovillous adenoma with high-grade dysplasia Physical exam 08/04/2020-perianal mass invading the anal canal and sphincter complex-biopsy positive for mucinous adenocarcinoma CTs 08/17/2020-no rectal mass identified, multiple liver cyst and too small to characterize low-attenuation liver lesions, midline ventral hernia containing proximal right colon, groundglass left upper lobe nodule and nonspecific 3 mm right middle lobe nodule Cycle 1 FOLFOX 09/15/2020 MRI pelvis at [facility] 09/17/2020- anal and low rectal mass.  Internal and external sphincters involved by the mass.  Subtle extension of the  tumor through the serosa of the low rectum.  No local regional adenopathy. MRI liver at [facility] 09/17/2020- scattered hepatic cysts.  No evidence of metastatic disease. Cycle 2 FOLFOX 09/29/2020 Cycle 3 FOLFOX 10/13/2020 Cycle 4 FOLFOX 10/27/2020-Udenyca Cycle 5 FOLFOX 11/19/2020-Udenyca Cycle 6 FOLFOX 12/02/2020-Udenyca Cycle 7 FOLFOX 12/15/2020-Udenyca Cycle 8 FOLFOX 12/29/2020 -oxaliplatin, 5-FU bolus, and Udenyca held Radiation/Xeloda 01/11/2021-02/18/2021 04/08/2021-rectal exam/anoscopy-no residual tumor seen Anal canal biopsies 05/14/2021-fibrosis and reactive changes, negative for dysplasia or malignancy CTs 06/03/2021-no rectal mass, no evidence of metastatic disease, unchanged left upper lobe nodule MRI rectum 07/29/2021-decrease size of anal mass, no rectal component Renal insufficiency 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 coli UTI/bacteremia Oxaliplatin neuropathy, persistent peripheral tingling following cycle 7 FOLFOX, progressive 02/05/2021- Cymbalta started, discontinued due to lack of efficacy, neuropathy improved 05/03/2021, improved 08/26/2021 Right IJ DVT 05/28/2021-apixaban, changed to Coumadin 08/03/2021    Disposition: Mr. Stephens appears stable.  He is in clinical remission from rectal cancer.  He is on a surveillance program.  He has follow-up with Dr. Morton Stall in March.  We are referring him for surveillance CT scans in about 8 weeks.  He is currently maintained on Coumadin for a right IJ DVT September 2022.  We are referring him for Port-A-Cath removal.  He was provided with instructions to hold Coumadin for 3 days prior to the removal, resume  night of the procedure at the current  dose, continue for 2 weeks and then stop.  He will return for follow-up here in approximately 8 weeks, surveillance CT scans a few days prior.  He will contact the office in the interim with any problems.  Plan reviewed with Dr. Benay Spice.    Ned Card ANP/GNP-BC   10/04/2021  9:23 AM

## 2021-10-04 NOTE — Telephone Encounter (Signed)
Called IR to schedule a port removal spoke with Tiffany she take the  patient information and stated she will call the patient to schedule him. And it will be a month out for removal of the port.

## 2021-10-05 ENCOUNTER — Telehealth: Payer: Self-pay

## 2021-10-05 ENCOUNTER — Other Ambulatory Visit: Payer: Self-pay

## 2021-10-05 DIAGNOSIS — C20 Malignant neoplasm of rectum: Secondary | ICD-10-CM

## 2021-10-05 NOTE — Telephone Encounter (Signed)
Called and spoke with the patient to let him know CEA is mildly elevated. Repeat in 8 weeks

## 2021-10-05 NOTE — Telephone Encounter (Signed)
-----   Message from Owens Shark, NP sent at 10/04/2021  5:29 PM EST ----- Please let him know the CEA is mildly elevated.  We will repeat in 8 weeks.

## 2021-10-18 ENCOUNTER — Other Ambulatory Visit: Payer: Self-pay | Admitting: Radiology

## 2021-10-19 ENCOUNTER — Other Ambulatory Visit: Payer: Self-pay | Admitting: Radiology

## 2021-10-19 ENCOUNTER — Other Ambulatory Visit: Payer: Self-pay | Admitting: Student

## 2021-10-19 ENCOUNTER — Other Ambulatory Visit: Payer: Self-pay | Admitting: Oncology

## 2021-10-20 ENCOUNTER — Telehealth: Payer: Self-pay

## 2021-10-20 ENCOUNTER — Other Ambulatory Visit: Payer: Self-pay | Admitting: Radiology

## 2021-10-20 NOTE — H&P (Signed)
Chief Complaint: Patient was seen in consultation today for port-a-catheter removal  Referring Physician(s): Owens Shark  Supervising Physician: Ruthann Cancer  Patient Status: [place]  History of Present Illness: Walter [name] is a 71 y.o. 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 He is status post chemotherapy/radiation therapy. IR placed a port-a-catheter 09/08/20. He has completed his treatments and is currently under surveillance. Recent imaging reveals no evidence of disease and Interventional Radiology has been requested to remove port-a-catheter.  Of note he was diagnosed with a right IJ DVT 05/28/21 and is currently taking coumadin. His oncologist had him stop taking Coumadin Sunday 10/17/21 for today's procedure.    Past Medical History:  Diagnosis Date   Family history of adverse reaction to anesthesia    son had difficulty waking up from colonoscopy   Hypertension    Rectal cancer ([place]) dx'd 07/2020   testicular ca    XRT comp    Past Surgical History:  Procedure Laterality Date   ANKLE SURGERY Left    HERNIA REPAIR     IR IMAGING GUIDED PORT INSERTION  09/08/2020   RECTAL BIOPSY  2021    Allergies: Patient has no known allergies.  Medications: Prior to Admission medications   Medication Sig Start Date End Date Taking? Authorizing Provider  acetaminophen (TYLENOL) 500 MG tablet Take 500 mg by mouth 2 (two) times daily. As needed    [provider]  amLODipine (NORVASC) 5 MG tablet Take 5 mg by mouth daily. 08/05/21   [provider]  chlorthalidone (HYGROTON) 25 MG tablet Take 25 mg by mouth daily. 07/10/20   [provider]  Cholecalciferol (D3-1000 PO) Take 1 tablet by mouth daily.    [provider]  Cyanocobalamin (VITAMIN B-12) 5000 MCG SUBL Place 5,000 mcg under the tongue daily.     [provider]  lidocaine-prilocaine (EMLA) cream Apply to port site 1-2 hours prior to use 12/02/20   Owens Shark, NP  Multiple Vitamin (MULTIVITAMIN WITH MINERALS) TABS tablet Take 1 tablet by mouth daily.    [provider]  potassium chloride SA (KLOR-CON) 20 MEQ tablet Take 20 mEq by mouth daily. 06/15/21   [provider]  tamsulosin (FLOMAX) 0.4 MG CAPS capsule Take 0.4 mg by mouth 2 (two) times daily. 04/29/21   [provider]  warfarin (COUMADIN) 5 MG tablet take 1 tablet by mouth daily at 4pm 09/24/21   Ladell Pier, MD     Family History  Problem Relation Age of Onset   Hypertension Father    Cancer Brother     Social History   Socioeconomic History   Marital status: Married    Spouse name: Not on file   Number of children: Not on file   Years of education: Not on file   Highest education level: Not on file  Occupational History   Not on file  Tobacco Use   Smoking status: Never   Smokeless tobacco: Never  Substance and Sexual Activity   Alcohol use: Never   Drug use: Not Currently   Sexual activity: Not on file  Other Topics Concern   Not on file  Social History Narrative   Not on file   Social Determinants of Health   Financial Resource Strain: Not on file  Food Insecurity: Not on file  Transportation Needs: Not on file  Physical Activity:  Not on file  Stress: Not on file  Social Connections: Not on file    Review of Systems: A 12 point ROS discussed and pertinent positives are indicated in the HPI above.  All other systems are negative.  Review of Systems  Constitutional:  Negative for appetite change and fatigue.  Respiratory:  Negative for cough and shortness of breath.   Cardiovascular:  Negative for chest pain.  Gastrointestinal:  Negative for abdominal pain, diarrhea, nausea and vomiting.  Neurological:  Positive for headaches. Negative for dizziness.       Occasional headaches   Vital Signs: BP 131/70     Pulse 70    Temp 98.1 F (36.7 C) (Oral)    Resp 18    SpO2 98%   Physical Exam Constitutional:      General: He is not in acute distress.    Appearance: He is not ill-appearing.  HENT:     Mouth/Throat:     Mouth: Mucous membranes are moist.     Pharynx: Oropharynx is clear.  Cardiovascular:     Comments: RIJ port-a-catheter; not accessed. Site unremarkable.  Pulmonary:     Effort: Pulmonary effort is normal.  Skin:    General: Skin is warm and dry.  Neurological:     Mental Status: He is alert and oriented to person, place, and time.    Imaging: No results found.  Labs:  CBC: Recent Labs    03/17/21 1038 06/03/21 1010 07/19/21 1301 08/26/21 0901  WBC 4.6 4.7 4.5 4.6  HGB 9.7* 10.7* 11.4* 11.7*  HCT 30.3* 34.3* 36.8* 37.3*  PLT 318 281 227 222    COAGS: Recent Labs    11/08/20 1945 08/03/21 1148 08/16/21 0932 09/02/21 0849 10/04/21 0833 10/21/21 1255  INR 1.3*   < > 2.5* 2.5* 2.5* 1.3*  APTT 37*  --   --   --   --   --    < > = values in this interval not displayed.    BMP: Recent Labs    06/03/21 1010 07/19/21 1301 08/26/21 0901 10/04/21 0833  NA 139 141 141 137  K 4.1 4.1 3.7 4.3  CL 102 106 106 101  CO2 27 28 28 28   GLUCOSE 96 85 86 87  BUN 27* 30* 33* 35*  CALCIUM 9.5 9.4 9.2 9.2  CREATININE 2.02* 1.99* 2.07* 2.18*  GFRNONAA 35* 35* 34* 32*    LIVER FUNCTION TESTS: Recent Labs    02/05/21 1042 02/17/21 0952 02/24/21 1607 03/17/21 1038  BILITOT 0.6 0.5 0.4 0.3  AST 18 16 23  14*  ALT 10 12 12 10   ALKPHOS 53 64 73 48  PROT 6.8 6.7 6.4* 6.5  ALBUMIN 3.7 3.6 3.2* 3.4*    TUMOR MARKERS: Recent Labs    03/17/21 1038 06/03/21 1010 08/26/21 0901 10/04/21 0833  CEA 5.57* 4.31 4.08 5.33*    Assessment and Plan:  Rectal cancer in remission; chemotherapy complete: Walter [name], 71 year old male, presents today to the [facility] department for port-a-catheter removal.  Risks and benefits of  port-a-catheter removal were discussed with the patient including, but not limited to bleeding, infection and need for additional procedures.  All of the patient's questions were answered, patient is agreeable to proceed. He has been NPO. Last dose of coumadin was Sunday 10/17/21.   Consent signed and in chart.  Thank you for this interesting consult.  I greatly enjoyed meeting Walter [name] and look forward to participating in their care.  A  copy of this report was sent to the requesting provider on this date.  Electronically Signed: Soyla Dryer, AGACNP-BC [phone] 10/21/2021, 1:22 PM   I spent a total of  30 Minutes   in face to face in clinical consultation, greater than 50% of which was counseling/coordinating care for port-a-catheter removal.

## 2021-10-20 NOTE — Telephone Encounter (Signed)
Patient called and left a voicemail stated he was not sure where to go for his appointment on 10/21/21. I informed the patient the appointment is schedule at [facility] at [facility], NPO, and he needs a driver. Patient voiced understanding of instructions and had no further questions or concerns at this time.

## 2021-10-21 ENCOUNTER — Encounter (HOSPITAL_COMMUNITY): Payer: Self-pay

## 2021-10-21 ENCOUNTER — Other Ambulatory Visit: Payer: Self-pay

## 2021-10-21 ENCOUNTER — Ambulatory Visit (HOSPITAL_COMMUNITY)
Admission: RE | Admit: 2021-10-21 | Discharge: 2021-10-21 | Disposition: A | Discharge status: Home / Self Care | Payer: Medicare Other | Source: Ambulatory Visit | Attending: Nurse Practitioner | Admitting: Nurse Practitioner

## 2021-10-21 ENCOUNTER — Ambulatory Visit (HOSPITAL_COMMUNITY)
Admission: RE | Admit: 2021-10-21 | Discharge: 2021-10-21 | Disposition: A | Discharge status: Home / Self Care | Payer: Medicare Other | Source: Ambulatory Visit | Attending: Oncology | Admitting: Oncology

## 2021-10-21 DIAGNOSIS — C20 Malignant neoplasm of rectum: Secondary | ICD-10-CM

## 2021-10-21 DIAGNOSIS — Z9221 Personal history of antineoplastic chemotherapy: Secondary | ICD-10-CM | POA: Insufficient documentation

## 2021-10-21 DIAGNOSIS — Z452 Encounter for adjustment and management of vascular access device: Secondary | ICD-10-CM | POA: Insufficient documentation

## 2021-10-21 DIAGNOSIS — Z7901 Long term (current) use of anticoagulants: Secondary | ICD-10-CM | POA: Diagnosis not present

## 2021-10-21 DIAGNOSIS — Z95828 Presence of other vascular implants and grafts: Secondary | ICD-10-CM

## 2021-10-21 DIAGNOSIS — Z923 Personal history of irradiation: Secondary | ICD-10-CM | POA: Diagnosis not present

## 2021-10-21 DIAGNOSIS — I82621 Acute embolism and thrombosis of deep veins of right upper extremity: Secondary | ICD-10-CM | POA: Diagnosis not present

## 2021-10-21 HISTORY — PX: IR REMOVAL TUN ACCESS W/ PORT W/O FL MOD SED: IMG2290

## 2021-10-21 HISTORY — DX: Family history of other specified conditions: Z84.89

## 2021-10-21 LAB — PROTIME-INR
INR: 1.3 — ABNORMAL HIGH (ref 0.8–1.2)
Prothrombin Time: 16.3 seconds — ABNORMAL HIGH (ref 11.4–15.2)

## 2021-10-21 MED ORDER — MIDAZOLAM HCL 2 MG/2ML IJ SOLN
INTRAMUSCULAR | Status: AC | PRN
  Administered 2021-10-21 (×2): 1 mg via INTRAVENOUS

## 2021-10-21 MED ORDER — FENTANYL CITRATE (PF) 100 MCG/2ML IJ SOLN
INTRAMUSCULAR | Status: AC
  Filled 2021-10-21: qty 2

## 2021-10-21 MED ORDER — FENTANYL CITRATE (PF) 100 MCG/2ML IJ SOLN
INTRAMUSCULAR | Status: AC | PRN
  Administered 2021-10-21 (×2): 50 ug via INTRAVENOUS

## 2021-10-21 MED ORDER — SODIUM CHLORIDE 0.9 % IV SOLN: INTRAVENOUS | Status: DC

## 2021-10-21 MED ORDER — MIDAZOLAM HCL 2 MG/2ML IJ SOLN
INTRAMUSCULAR | Status: AC
  Filled 2021-10-21: qty 2

## 2021-10-21 MED ORDER — LIDOCAINE-EPINEPHRINE 1 %-1:100000 IJ SOLN
INTRAMUSCULAR | Status: AC
  Filled 2021-10-21: qty 1

## 2021-10-21 NOTE — Procedures (Signed)
Interventional Radiology Procedure Note  Procedure: Port removal  Findings: Please refer to procedural dictation for full description.  Complications: None  Estimated Blood Loss: < 5 mL  Recommendations: Local wound care. Follow up with IR as needed.   Ruthann Cancer, MD

## 2021-10-21 NOTE — Discharge Instructions (Signed)
Interventional radiology phone numbers [phone] After hours [phone]  Implanted Port Removal, Care After This sheet gives you information about how to care for yourself after your procedure. Your health care provider may also give you more specific instructions. If you have problems or questions, contact your health care provider. What can I expect after the procedure? After the procedure, it is common to have: Soreness or pain near your incision. Some swelling or bruising near your incision. Follow these instructions at home: Medicines Take over-the-counter and prescription medicines only as told by your health care provider. If you were prescribed an antibiotic medicine, take it as told by your health care provider. Do not stop taking the antibiotic even if you start to feel better. Bathing Do not take baths, swim, or use a hot tub until your health care provider approves. You may shower tomorrow. Remove your dressing prior to your shower. Incision care Follow instructions from your health care provider about how to take care of your incision. Make sure you: Wash your hands with soap and water before you change your bandage (dressing). If soap and water are not available, use hand sanitizer. Keep your dressing dry. Leave  skin glue in place. These skin closures may need to stay in place for 2 weeks or longer. . Check your incision area every day for signs of infection. Check for: More redness, swelling, or pain. More fluid or blood. Warmth. Pus or a bad smell.   Driving Do not drive for 24 hours if you were given a medicine to help you relax (sedative) during your procedure. If you did not receive a sedative, ask your health care provider when it is safe to drive.   Activity Return to your normal activities as told by your health care provider. Ask your health care provider what activities are safe for you. Do not lift anything that is heavier than 10 lb (4.5 kg), or the  limit that you are told, until your health care provider says that it is safe. Do not do activities that involve lifting your arms over your head. General instructions Do not use any products that contain nicotine or tobacco, such as cigarettes and e-cigarettes. These can delay healing. If you need help quitting, ask your health care provider. Keep all follow-up visits as told by your health care provider. This is important. Contact a health care provider if: You have more redness, swelling, or pain around your incision. You have more fluid or blood coming from your incision. Your incision feels warm to the touch. You have pus or a bad smell coming from your incision. You have pain that is not relieved by your pain medicine. Get help right away if you have: A fever or chills. Chest pain. Difficulty breathing. Summary After the procedure, it is common to have pain, soreness, swelling, or bruising near your incision. If you were prescribed an antibiotic medicine, take it as told by your health care provider. Do not stop taking the antibiotic even if you start to feel better. Do not drive for 24 hours if you were given a sedative during your procedure. Return to your normal activities as told by your health care provider. Ask your health care provider what activities are safe for you. This information is not intended to replace advice given to you by your health care provider. Make sure you discuss any questions you have with your health care provider. Document Revised: 10/19/2017 Document Reviewed: 10/19/2017 Elsevier Patient Education  2021 Elsevier Inc.       Moderate Conscious Sedation, Adult, Care After This sheet gives you information about how to care for yourself after your procedure. Your health care provider may also give you more specific instructions. If you have problems or questions, contact your health care provider. What can I expect after the procedure? After the procedure,  it is common to have: Sleepiness for several hours. Impaired judgment for several hours. Difficulty with balance. Vomiting if you eat too soon. Follow these instructions at home: For the time period you were told by your health care provider: Rest. Do not participate in activities where you could fall or become injured. Do not drive or use machinery. Do not drink alcohol. Do not take sleeping pills or medicines that cause drowsiness. Do not make important decisions or sign legal documents. Do not take care of children on your own.      Eating and drinking Follow the diet recommended by your health care provider. Drink enough fluid to keep your urine pale yellow. If you vomit: Drink water, juice, or soup when you can drink without vomiting. Make sure you have little or no nausea before eating solid foods.   General instructions Take over-the-counter and prescription medicines only as told by your health care provider. Have a responsible adult stay with you for the time you are told. It is important to have someone help care for you until you are awake and alert. Do not smoke. Keep all follow-up visits as told by your health care provider. This is important. Contact a health care provider if: You are still sleepy or having trouble with balance after 24 hours. You feel light-headed. You keep feeling nauseous or you keep vomiting. You develop a rash. You have a fever. You have redness or swelling around the IV site. Get help right away if: You have trouble breathing. You have new-onset confusion at home. Summary After the procedure, it is common to feel sleepy, have impaired judgment, or feel nauseous if you eat too soon. Rest after you get home. Know the things you should not do after the procedure. Follow the diet recommended by your health care provider and drink enough fluid to keep your urine pale yellow. Get help right away if you have trouble breathing or new-onset  confusion at home. This information is not intended to replace advice given to you by your health care provider. Make sure you discuss any questions you have with your health care provider. Document Revised: 01/03/2020 Document Reviewed: 08/01/2019 Elsevier Patient Education  2021 Elsevier Inc.  

## 2021-11-10 ENCOUNTER — Telehealth: Payer: Self-pay

## 2021-11-10 NOTE — Telephone Encounter (Signed)
TC from Pt inquiring about discontinuing coumadin. Pt stated he had his port removed the beginning of February. Informed Pt per note on 10/04/21 he was to discontinue coumadin 2 weeks after port removal. Pt verbalized understanding.

## 2021-11-22 ENCOUNTER — Inpatient Hospital Stay: Payer: Medicare Other | Attending: Oncology

## 2021-11-22 ENCOUNTER — Other Ambulatory Visit: Payer: Self-pay

## 2021-11-22 DIAGNOSIS — N289 Disorder of kidney and ureter, unspecified: Secondary | ICD-10-CM | POA: Insufficient documentation

## 2021-11-22 DIAGNOSIS — R200 Anesthesia of skin: Secondary | ICD-10-CM | POA: Insufficient documentation

## 2021-11-22 DIAGNOSIS — R911 Solitary pulmonary nodule: Secondary | ICD-10-CM | POA: Diagnosis not present

## 2021-11-22 DIAGNOSIS — Z85048 Personal history of other malignant neoplasm of rectum, rectosigmoid junction, and anus: Secondary | ICD-10-CM | POA: Insufficient documentation

## 2021-11-22 DIAGNOSIS — Z923 Personal history of irradiation: Secondary | ICD-10-CM | POA: Insufficient documentation

## 2021-11-22 DIAGNOSIS — Z86010 Personal history of colonic polyps: Secondary | ICD-10-CM | POA: Diagnosis not present

## 2021-11-22 DIAGNOSIS — G4730 Sleep apnea, unspecified: Secondary | ICD-10-CM | POA: Insufficient documentation

## 2021-11-22 DIAGNOSIS — Z8547 Personal history of malignant neoplasm of testis: Secondary | ICD-10-CM | POA: Diagnosis not present

## 2021-11-22 DIAGNOSIS — R3915 Urgency of urination: Secondary | ICD-10-CM | POA: Diagnosis not present

## 2021-11-22 DIAGNOSIS — R609 Edema, unspecified: Secondary | ICD-10-CM | POA: Diagnosis not present

## 2021-11-22 DIAGNOSIS — K429 Umbilical hernia without obstruction or gangrene: Secondary | ICD-10-CM | POA: Insufficient documentation

## 2021-11-22 DIAGNOSIS — R202 Paresthesia of skin: Secondary | ICD-10-CM | POA: Insufficient documentation

## 2021-11-22 DIAGNOSIS — I129 Hypertensive chronic kidney disease with stage 1 through stage 4 chronic kidney disease, or unspecified chronic kidney disease: Secondary | ICD-10-CM | POA: Diagnosis not present

## 2021-11-22 DIAGNOSIS — C20 Malignant neoplasm of rectum: Secondary | ICD-10-CM

## 2021-11-22 DIAGNOSIS — Z87442 Personal history of urinary calculi: Secondary | ICD-10-CM | POA: Diagnosis not present

## 2021-11-22 DIAGNOSIS — Z86718 Personal history of other venous thrombosis and embolism: Secondary | ICD-10-CM | POA: Diagnosis not present

## 2021-11-22 DIAGNOSIS — I700 Atherosclerosis of aorta: Secondary | ICD-10-CM | POA: Insufficient documentation

## 2021-11-22 LAB — CBC WITH DIFFERENTIAL (CANCER CENTER ONLY)
Abs Immature Granulocytes: 0.02 10*3/uL (ref 0.00–0.07)
Basophils Absolute: 0 10*3/uL (ref 0.0–0.1)
Basophils Relative: 1 %
Eosinophils Absolute: 0.1 10*3/uL (ref 0.0–0.5)
Eosinophils Relative: 2 %
HCT: 39.6 % (ref 39.0–52.0)
Hemoglobin: 12.4 g/dL — ABNORMAL LOW (ref 13.0–17.0)
Immature Granulocytes: 1 %
Lymphocytes Relative: 24 %
Lymphs Abs: 1 10*3/uL (ref 0.7–4.0)
MCH: 26.1 pg (ref 26.0–34.0)
MCHC: 31.3 g/dL (ref 30.0–36.0)
MCV: 83.2 fL (ref 80.0–100.0)
Monocytes Absolute: 0.6 10*3/uL (ref 0.1–1.0)
Monocytes Relative: 15 %
Neutro Abs: 2.5 10*3/uL (ref 1.7–7.7)
Neutrophils Relative %: 57 %
Platelet Count: 230 10*3/uL (ref 150–400)
RBC: 4.76 MIL/uL (ref 4.22–5.81)
RDW: 15.1 % (ref 11.5–15.5)
WBC Count: 4.3 10*3/uL (ref 4.0–10.5)
nRBC: 0 % (ref 0.0–0.2)

## 2021-11-22 LAB — CEA (ACCESS): CEA (CHCC): 6.73 ng/mL — ABNORMAL HIGH (ref 0.00–5.00)

## 2021-11-29 ENCOUNTER — Inpatient Hospital Stay: Payer: Medicare Other

## 2021-11-29 ENCOUNTER — Ambulatory Visit (HOSPITAL_BASED_OUTPATIENT_CLINIC_OR_DEPARTMENT_OTHER)
Admission: RE | Admit: 2021-11-29 | Discharge: 2021-11-29 | Disposition: A | Discharge status: Home / Self Care | Payer: Medicare Other | Source: Ambulatory Visit | Attending: Nurse Practitioner | Admitting: Nurse Practitioner

## 2021-11-29 ENCOUNTER — Encounter (HOSPITAL_BASED_OUTPATIENT_CLINIC_OR_DEPARTMENT_OTHER): Payer: Self-pay

## 2021-11-29 ENCOUNTER — Other Ambulatory Visit: Payer: Self-pay

## 2021-11-29 DIAGNOSIS — I82621 Acute embolism and thrombosis of deep veins of right upper extremity: Secondary | ICD-10-CM

## 2021-11-29 DIAGNOSIS — C20 Malignant neoplasm of rectum: Secondary | ICD-10-CM

## 2021-11-29 DIAGNOSIS — Z85048 Personal history of other malignant neoplasm of rectum, rectosigmoid junction, and anus: Secondary | ICD-10-CM | POA: Diagnosis not present

## 2021-11-29 DIAGNOSIS — Z95828 Presence of other vascular implants and grafts: Secondary | ICD-10-CM

## 2021-11-29 LAB — CEA (ACCESS): CEA (CHCC): 6.65 ng/mL — ABNORMAL HIGH (ref 0.00–5.00)

## 2021-11-30 ENCOUNTER — Ambulatory Visit (HOSPITAL_BASED_OUTPATIENT_CLINIC_OR_DEPARTMENT_OTHER)
Admission: RE | Admit: 2021-11-30 | Discharge: 2021-11-30 | Disposition: A | Discharge status: Home / Self Care | Payer: Medicare Other | Source: Ambulatory Visit | Attending: Nurse Practitioner | Admitting: Nurse Practitioner

## 2021-11-30 DIAGNOSIS — I82621 Acute embolism and thrombosis of deep veins of right upper extremity: Secondary | ICD-10-CM | POA: Insufficient documentation

## 2021-11-30 DIAGNOSIS — C20 Malignant neoplasm of rectum: Secondary | ICD-10-CM | POA: Insufficient documentation

## 2021-11-30 DIAGNOSIS — Z95828 Presence of other vascular implants and grafts: Secondary | ICD-10-CM | POA: Insufficient documentation

## 2021-12-02 ENCOUNTER — Other Ambulatory Visit: Payer: Self-pay

## 2021-12-02 ENCOUNTER — Inpatient Hospital Stay (HOSPITAL_BASED_OUTPATIENT_CLINIC_OR_DEPARTMENT_OTHER): Payer: Medicare Other | Admitting: Oncology

## 2021-12-02 VITALS — BP 133/71 | HR 87 | Temp 97.8°F | Resp 18 | Ht 69.0 in | Wt 177.2 lb

## 2021-12-02 DIAGNOSIS — C20 Malignant neoplasm of rectum: Secondary | ICD-10-CM

## 2021-12-02 DIAGNOSIS — Z85048 Personal history of other malignant neoplasm of rectum, rectosigmoid junction, and anus: Secondary | ICD-10-CM | POA: Diagnosis not present

## 2021-12-02 NOTE — Progress Notes (Signed)
?[address] ?OFFICE PROGRESS NOTE ? ? ?Diagnosis: Rectal cancer ? ?INTERVAL HISTORY:  ? ?Walter [name] returns as scheduled.  He continues to have numbness and tingling in the hands and feet.  He walks 3 to 4 miles per day.  He has urinary urgency.  He is seeing a urologist.  Occasional rectal bleeding.  He is scheduled to see Dr. Morton Stall next week. ? ?Objective: ? ?Vital signs in last 24 hours: ? ?Blood pressure 133/71, pulse 87, temperature 97.8 ?F (36.6 ?C), temperature source Oral, resp. rate 18, height '5\' 9"'$  (1.753 m), weight 177 lb 3.2 oz (80.4 kg), SpO2 99 %. ?  ?Lymphatics: No cervical, supraclavicular, axillary, or inguinal nodes ?Resp: Lungs clear bilaterally ?Cardio: Regular rate and rhythm ?GI: No hepatosplenomegaly, reducible ventral hernia, no mass ?Vascular: No leg edema ? ?Lab Results: ? ?Lab Results  ?Component Value Date  ? WBC 4.3 11/22/2021  ? HGB 12.4 (L) 11/22/2021  ? HCT 39.6 11/22/2021  ? MCV 83.2 11/22/2021  ? PLT 230 11/22/2021  ? NEUTROABS 2.5 11/22/2021  ? ? ?CMP  ?Lab Results  ?Component Value Date  ? NA 137 10/04/2021  ? K 4.3 10/04/2021  ? CL 101 10/04/2021  ? CO2 28 10/04/2021  ? GLUCOSE 87 10/04/2021  ? BUN 35 (H) 10/04/2021  ? CREATININE 2.18 (H) 10/04/2021  ? CALCIUM 9.2 10/04/2021  ? PROT 6.5 03/17/2021  ? ALBUMIN 3.4 (L) 03/17/2021  ? AST 14 (L) 03/17/2021  ? ALT 10 03/17/2021  ? ALKPHOS 48 03/17/2021  ? BILITOT 0.3 03/17/2021  ? GFRNONAA 32 (L) 10/04/2021  ? ? ?Lab Results  ?Component Value Date  ? CEA1 5.84 (H) 03/17/2021  ? CEA 6.65 (H) 11/29/2021  ? ? ?Lab Results  ?Component Value Date  ? INR 1.3 (H) 10/21/2021  ? LABPROT 16.3 (H) 10/21/2021  ? ? ?Imaging: ? ?CT CHEST ABDOMEN PELVIS WO CONTRAST ? ?Result Date: 11/30/2021 ?CLINICAL DATA:  Rectal cancer, assess treatment response. In clinical remission since late 2022. History of testicular cancer. * onc * EXAM: CT CHEST, ABDOMEN AND PELVIS WITHOUT CONTRAST TECHNIQUE: Multidetector CT imaging of the chest, abdomen  and pelvis was performed following the standard protocol without IV contrast. RADIATION DOSE REDUCTION: This exam was performed according to the departmental dose-optimization program which includes automated exposure control, adjustment of the mA and/or kV according to patient size and/or use of iterative reconstruction technique. COMPARISON:  06/03/2021 and baseline 08/17/2020. FINDINGS: CT CHEST FINDINGS Cardiovascular: Coronary artery calcification. Heart size normal. Similar anterior pericardial fluid. Mediastinum/Nodes: No pathologically enlarged mediastinal or axillary lymph nodes. Hilar regions are difficult to definitively evaluate without IV contrast. Esophagus is grossly unremarkable. Lungs/Pleura: Scattered subsegmental volume loss. Subsolid 5 mm left upper lobe nodule (4/41), stable. Volume loss in the lingula and left lower lobe, adjacent to an elevated left hemidiaphragm. No pleural fluid. Airway is unremarkable. Musculoskeletal: No worrisome lytic or sclerotic lesions. CT ABDOMEN PELVIS FINDINGS Hepatobiliary: Somewhat ill-defined low-attenuation lesions in the liver measure up to approximately 1.1 x 1.7 cm in the right hepatic lobe (2/61), similar to minimally enlarged from 08/17/2020. There are other well-circumscribed low-attenuation lesions in the liver, measuring up to 2.9 cm (2/68), stable. Liver and gallbladder are otherwise unremarkable. No biliary ductal dilatation. Pancreas: Negative. Spleen: Negative. Adrenals/Urinary Tract: Adrenal glands are unremarkable. 3.6 cm fluid density lesion in the upper pole right kidney is likely a cyst. Left kidney is atrophic and contains a subcentimeter low-attenuation lesion, too small to characterize. Ureters are decompressed. Bladder  is relatively low in volume. Stomach/Bowel: Stomach, small bowel and appendix are unremarkable. Cecum extends into a midline umbilical hernia. Colon is otherwise unremarkable. Rectum is decompressed, limiting evaluation.  Vascular/Lymphatic: Atherosclerotic calcification of the aorta. No pathologically enlarged lymph nodes. Reproductive: Prostate is visualized. Other: Minimal presacral edema, improved from 06/03/2021. No free fluid. Midline umbilical hernia containing unobstructed cecum, as mentioned above. Left spigelian hernia contains fat. Mesenteries and peritoneum are otherwise unremarkable. Musculoskeletal: No worrisome lytic or sclerotic lesions. Degenerative changes in the spine and hips. IMPRESSION: 1. No evidence of metastatic disease. Rectum is decompressed, limiting evaluation. Associated presacral edema/thickening has improved slightly in the interval. 2. Persistent subsolid 5 mm left upper lobe nodule. Continued attention on follow-up exams is warranted as an indolent adenocarcinoma cannot be excluded. 3. Cecum extends into an umbilical hernia. 4. Numerous low-attenuation lesions in the liver, stable to minimally enlarged from 08/17/2020, findings favoring benign lesions such as cysts and/or hemangiomas. 5. Aortic atherosclerosis (ICD10-I70.0). Coronary artery calcification. Electronically Signed   By: Lorin Picket M.D.   On: 11/30/2021 13:22   ? ?Medications: I have reviewed the patient's current medications. ? ? ?Assessment/Plan: ?Rectal cancer ?Rectal nodularity noted on colonoscopy 06/29/2020, removal of a rectal "polyp" revealed a tubulovillous adenoma with high-grade dysplasia ?Physical exam 08/04/2020-perianal mass invading the anal canal and sphincter complex-biopsy positive for mucinous adenocarcinoma ?CTs 08/17/2020-no rectal mass identified, multiple liver cyst and too small to characterize low-attenuation liver lesions, midline ventral hernia containing proximal right colon, groundglass left upper lobe nodule and nonspecific 3 mm right middle lobe nodule ?Cycle 1 FOLFOX 09/15/2020 ?[facility] pelvis [facility] 09/17/2020- anal and low rectal mass.  Internal and external sphincters involved by the mass.  Subtle  extension of the tumor through the serosa of the low rectum.  No local regional adenopathy. ?MRI liver at [facility] 09/17/2020- scattered hepatic cysts.  No evidence of metastatic disease. ?Cycle 2 FOLFOX 09/29/2020 ?Cycle 3 FOLFOX 10/13/2020 ?Cycle 4 FOLFOX 10/27/2020-Udenyca ?Cycle 5 FOLFOX 11/19/2020-Udenyca ?Cycle 6 FOLFOX 12/02/2020-Udenyca ?Cycle 7 FOLFOX 12/15/2020-Udenyca ?Cycle 8 FOLFOX 12/29/2020 -oxaliplatin, 5-FU bolus, and Udenyca held ?Radiation/Xeloda 01/11/2021-02/18/2021 ?04/08/2021-rectal exam/anoscopy-no residual tumor seen ?Anal canal biopsies 05/14/2021-fibrosis and reactive changes, negative for dysplasia or malignancy ?CTs 06/03/2021-no rectal mass, no evidence of metastatic disease, unchanged left upper lobe nodule ?MRI rectum 07/29/2021-decrease size of anal mass, no rectal component ?CTs 11/30/2021-no evidence of metastatic disease, improvement in presacral edema, stable subsolid 5 mm left upper lobe nodule, numerous low-attenuation liver lesions stable-minimally enlarged compared to 08/17/2020 favoring benign lesions ?CEA mildly elevated March 2023 ?Renal insufficiency ?History of left testicular cancer treated in [place] in the 1990s with a left orchiectomy and "radiation " ?Hypertension ?History of kidney stones ?Sleep apnea ?Umbilical hernia repair ?Multiple polyps removed on colonoscopy 06/29/2020 including a benign serrated polyp, tubular adenomas, and a tubulovillous adenoma with high-grade dysplasia at the rectum ?Hospital admission 11/08/2020-E. 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  ? ? ? ?Disposition: ?Mr. Ohms remains in clinical remission from rectal cancer.   He is scheduled to see Dr. Morton Stall for surveillance exam next week.  The CEA is mildly elevated.  This may be a benign finding versus evidence of subclinical disease.  He will return for an office visit an

## 2021-12-14 IMAGING — CT CT CHEST-ABD-PELV W/O CM
2 of 5 series · 14 of 46 positions shown, 16 images · non-contrast
Comparison: 08/17/2020

CLINICAL DATA: Rectal adenocarcinoma restaging, surveillance

EXAM:
CT CHEST, ABDOMEN AND PELVIS WITHOUT CONTRAST
TECHNIQUE: Multidetector CT imaging of the chest, abdomen and pelvis was
performed following the standard protocol without IV contrast.

[Series 3: thins · axial · 0.73mm/px · z∈[+951,+1488]mm · 11 of 952 slices shown, 13 images]
[im 83/952  soft-tissue]
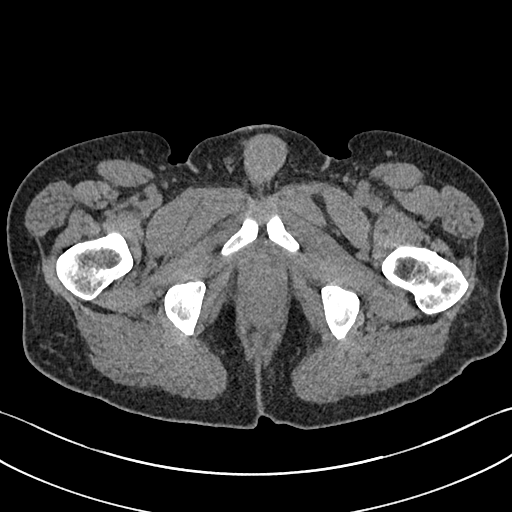
[im 83/952  bone]
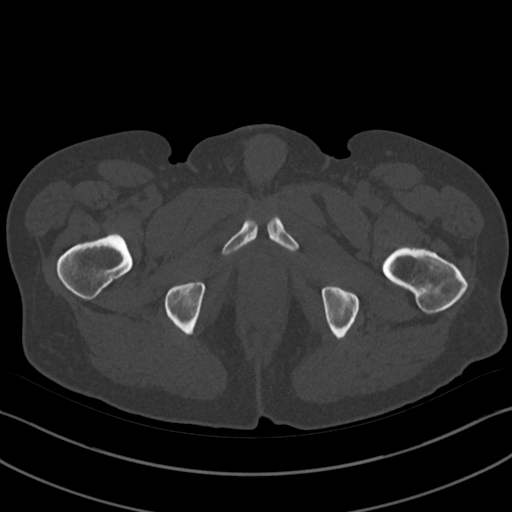
[im 166/952  soft-tissue]
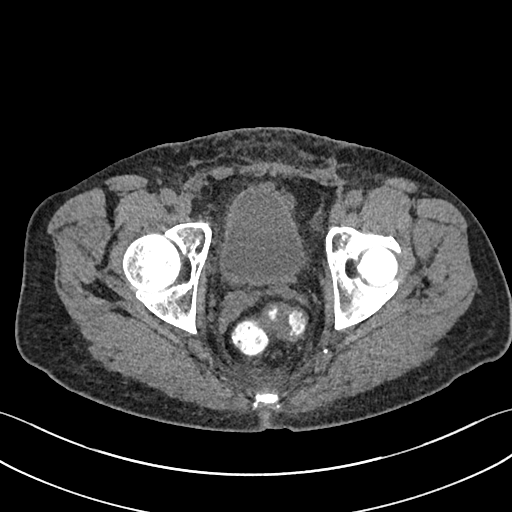
[im 249/952  soft-tissue]
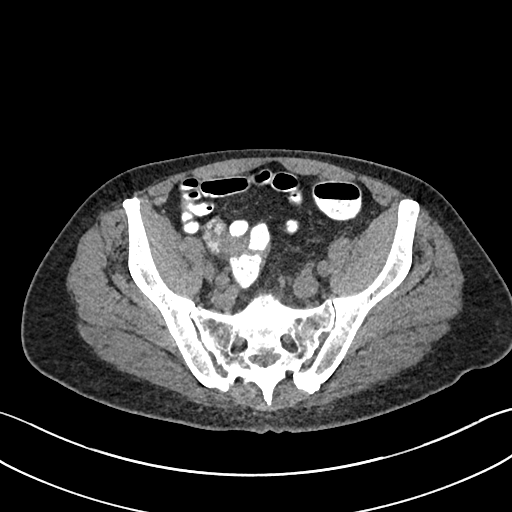
[im 331/952  soft-tissue]
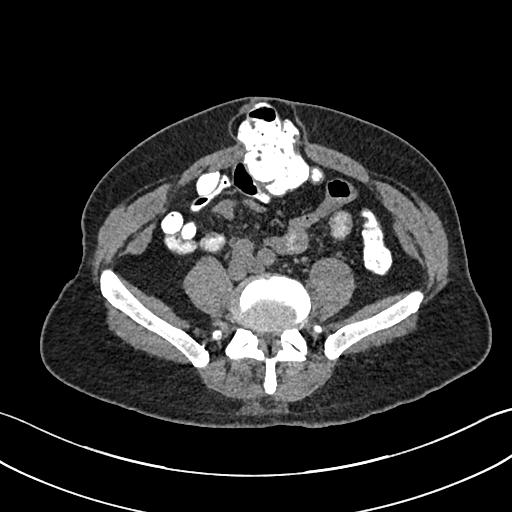
[im 414/952  soft-tissue]
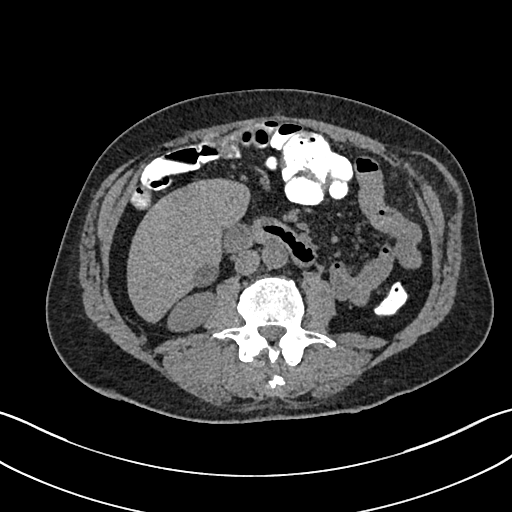
[im 497/952  soft-tissue]
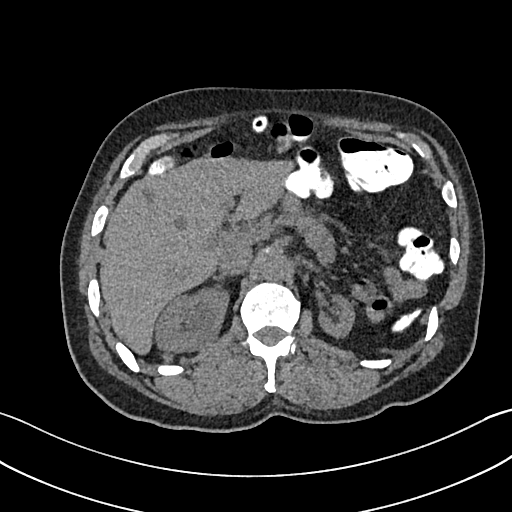
[im 579/952  soft-tissue]
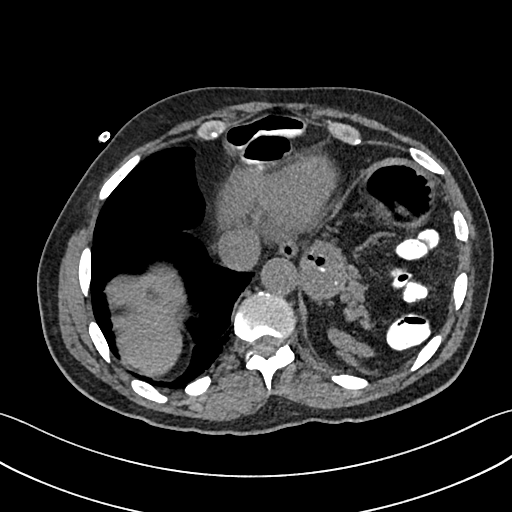
[im 662/952  soft-tissue]
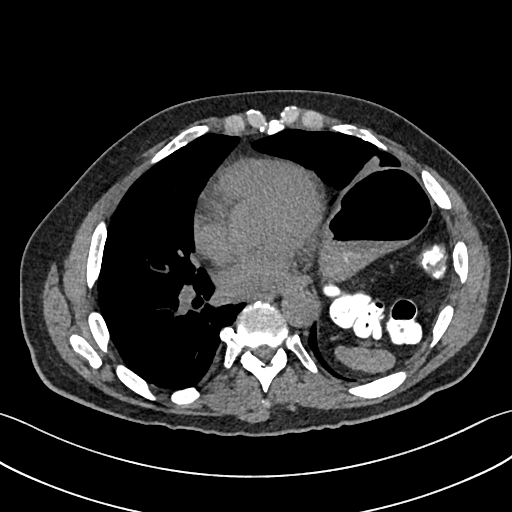
[im 745/952  soft-tissue]
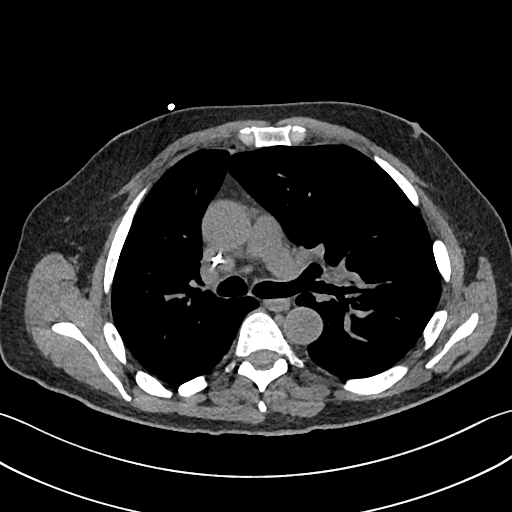
[im 745/952  bone]
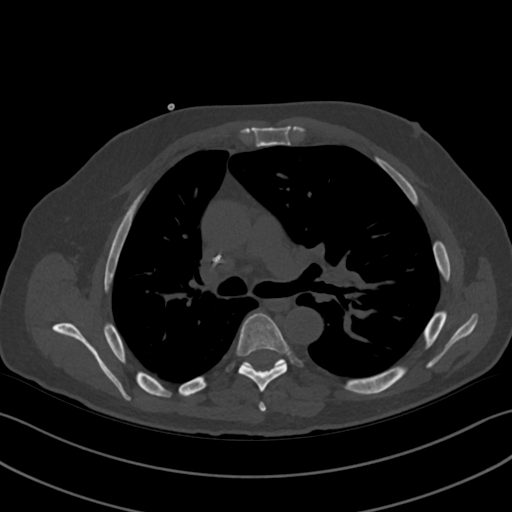
[im 827/952  soft-tissue]
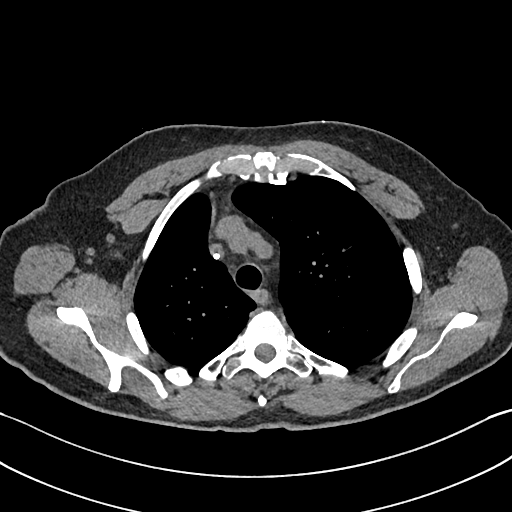
[im 910/952  soft-tissue]
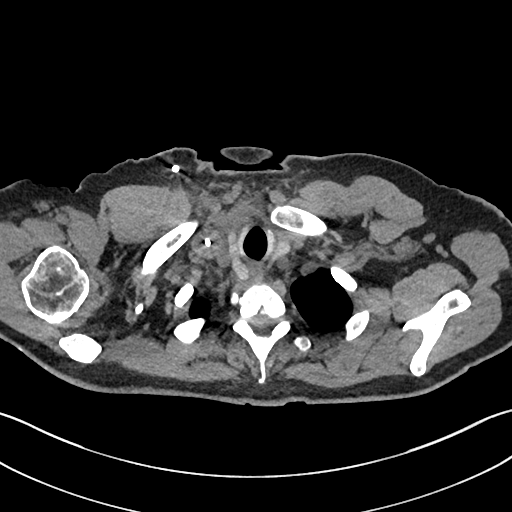

[Series 5: coronal · coronal · 0.81mm/px · 3 of 89 slices shown]
[im 30/89  soft-tissue]
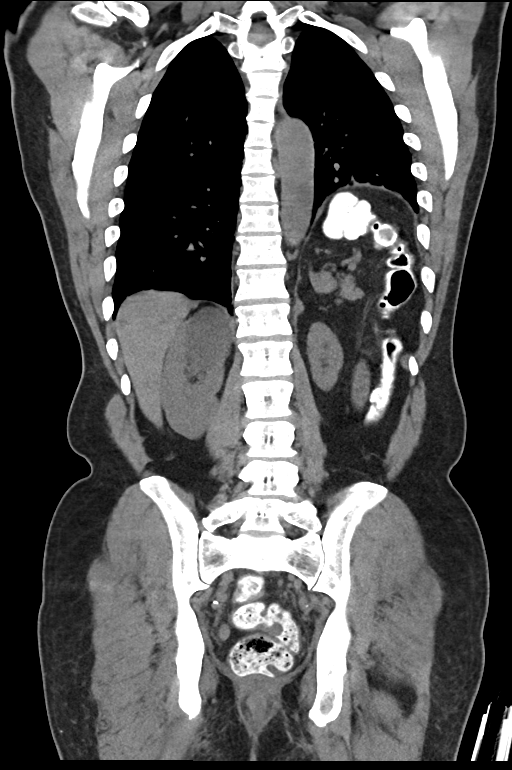
[im 40/89  soft-tissue]
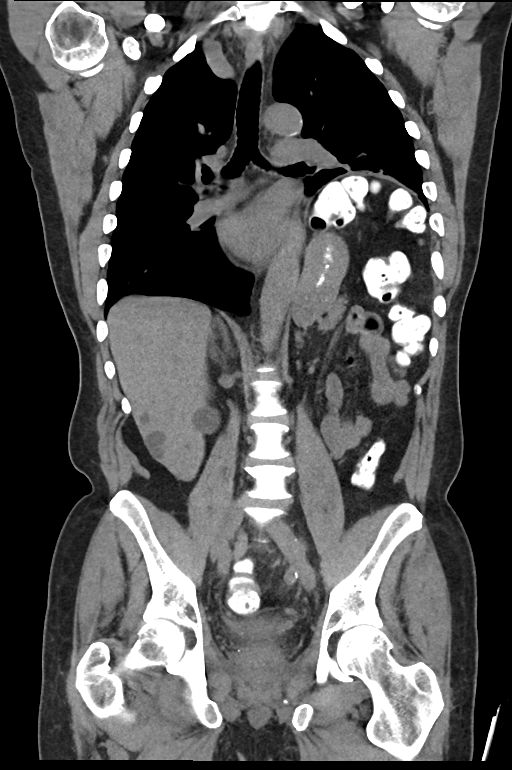
[im 49/89  soft-tissue]
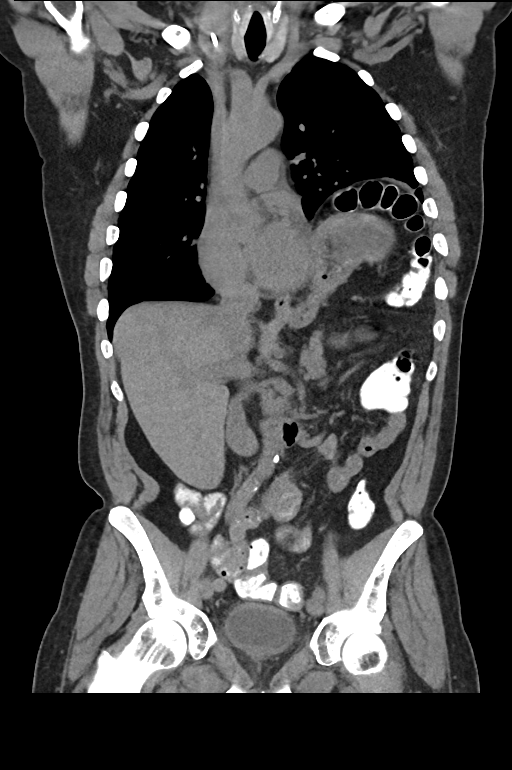

[14 of 46 positions shown; findings below may reference images not displayed]

FINDINGS: CT CHEST FINDINGS

Cardiovascular: Right chest port catheter. Scattered aortic
atherosclerosis. Normal heart size. No pericardial effusion.

Mediastinum/Nodes: No enlarged mediastinal, hilar, or axillary lymph
nodes. Thyroid gland, trachea, and esophagus demonstrate no
significant findings.

Lungs/Pleura: Unchanged small ground-glass nodule of the central
left upper lobe, measuring 0.6 cm (series 4, image 38). Severe
elevation of the left hemidiaphragm (series 5, image 42). No pleural
effusion or pneumothorax.

Musculoskeletal: No chest wall mass or suspicious bone lesions
identified.

CT ABDOMEN PELVIS FINDINGS

Hepatobiliary: No solid liver abnormality is seen. Numerous
low-attenuation lesions are again seen throughout the liver
parenchyma, the largest of which are clearly fluid attenuation and
consistent with cysts or hemangiomata, many others too small to
characterize, particularly given lack of intravenous contrast. These
lesions are however unchanged compared to prior. No gallstones,
gallbladder wall thickening, or biliary dilatation.

Pancreas: Unremarkable. No pancreatic ductal dilatation or
surrounding inflammatory changes.

Spleen: Normal in size without significant abnormality.

Adrenals/Urinary Tract: Adrenal glands are unremarkable. Simple cyst
of the superior pole of the right kidney. Markedly atrophic left
kidney. No calculi or hydronephrosis. Bladder is unremarkable.

Stomach/Bowel: Stomach is within normal limits. Appendix appears
normal. No evidence of bowel wall thickening or distention. No
primary rectal mass identified by this noncontrast CT. There is
perirectal and presacral soft tissue thickening and fat stranding
(series 2, image 107).

Vascular/Lymphatic: No significant vascular findings are present. No
enlarged abdominal or pelvic lymph nodes.

Reproductive: No mass or other abnormality. Status post left
orchiectomy.

Other: Midline ventral hernia containing nonobstructed transverse
colon (series 2, image 79). No abdominopelvic ascites.

Musculoskeletal: No acute or significant osseous findings.
IMPRESSION: 1. No primary rectal mass identified by this noncontrast CT. There
is perirectal and presacral soft tissue thickening and fat
stranding, likely post treatment/post radiation change.
2. No noncontrast CT evidence of lymphadenopathy or metastatic
disease in the chest, abdomen, or pelvis.
3. Unchanged small ground-glass nodule of the central left upper
lobe, measuring 0.6 cm, nonspecific and most likely infectious or
inflammatory. Follow-up CT is recommended in 2 years to ensure
stability per [HOSPITAL] criteria, if not otherwise imaged
for cancer surveillance.
4. Numerous low-attenuation lesions are again seen throughout the
liver parenchyma, the largest of which are clearly fluid attenuation
and consistent with cysts or hemangiomata, many others too small to
characterize, particularly given lack of intravenous contrast. These
lesions are however unchanged compared to prior and almost certainly
benign cysts or hemangiomata. Attention on follow-up.
5. Status post left orchiectomy.
6. Midline ventral hernia containing nonobstructed transverse colon.

Aortic Atherosclerosis ([id number]).

## 2022-02-24 ENCOUNTER — Other Ambulatory Visit: Payer: Self-pay

## 2022-02-24 ENCOUNTER — Inpatient Hospital Stay (HOSPITAL_BASED_OUTPATIENT_CLINIC_OR_DEPARTMENT_OTHER): Payer: Medicare Other | Admitting: Nurse Practitioner

## 2022-02-24 ENCOUNTER — Inpatient Hospital Stay: Payer: Medicare Other | Attending: Oncology

## 2022-02-24 ENCOUNTER — Encounter: Payer: Self-pay | Admitting: Nurse Practitioner

## 2022-02-24 VITALS — BP 135/75 | HR 71 | Temp 98.2°F | Resp 18 | Wt 175.4 lb

## 2022-02-24 DIAGNOSIS — Z85048 Personal history of other malignant neoplasm of rectum, rectosigmoid junction, and anus: Secondary | ICD-10-CM | POA: Diagnosis present

## 2022-02-24 DIAGNOSIS — N289 Disorder of kidney and ureter, unspecified: Secondary | ICD-10-CM | POA: Insufficient documentation

## 2022-02-24 DIAGNOSIS — C20 Malignant neoplasm of rectum: Secondary | ICD-10-CM

## 2022-02-24 DIAGNOSIS — K439 Ventral hernia without obstruction or gangrene: Secondary | ICD-10-CM | POA: Diagnosis not present

## 2022-02-24 DIAGNOSIS — I10 Essential (primary) hypertension: Secondary | ICD-10-CM | POA: Insufficient documentation

## 2022-02-24 DIAGNOSIS — Z9221 Personal history of antineoplastic chemotherapy: Secondary | ICD-10-CM | POA: Insufficient documentation

## 2022-02-24 DIAGNOSIS — Z87442 Personal history of urinary calculi: Secondary | ICD-10-CM | POA: Diagnosis not present

## 2022-02-24 DIAGNOSIS — G629 Polyneuropathy, unspecified: Secondary | ICD-10-CM | POA: Insufficient documentation

## 2022-02-24 DIAGNOSIS — G4730 Sleep apnea, unspecified: Secondary | ICD-10-CM | POA: Insufficient documentation

## 2022-02-24 LAB — BASIC METABOLIC PANEL - CANCER CENTER ONLY
Anion gap: 9 (ref 5–15)
BUN: 27 mg/dL — ABNORMAL HIGH (ref 8–23)
CO2: 29 mmol/L (ref 22–32)
Calcium: 9.4 mg/dL (ref 8.9–10.3)
Chloride: 106 mmol/L (ref 98–111)
Creatinine: 2.16 mg/dL — ABNORMAL HIGH (ref 0.61–1.24)
GFR, Estimated: 32 mL/min — ABNORMAL LOW (ref >60)
Glucose, Bld: 85 mg/dL (ref 70–99)
Potassium: 4.2 mmol/L (ref 3.5–5.1)
Sodium: 144 mmol/L (ref 135–145)

## 2022-02-24 LAB — CEA (ACCESS): CEA (CHCC): 5.08 ng/mL — ABNORMAL HIGH (ref 0.00–5.00)

## 2022-02-24 NOTE — Progress Notes (Signed)
[place] OFFICE PROGRESS NOTE   Diagnosis: Rectal cancer  INTERVAL HISTORY:   Walter [name] returns as scheduled.  He intermittently feels stronger.  He has a good appetite.  Weight is stable.  He is having regular bowel movements.  Some rectal itching, no pain.  He occasionally sees a small amount of blood.  He has persistent neuropathy symptoms in the fingertips and toes.  Objective:  Vital signs in last 24 hours:  Blood pressure 135/75, pulse 71, temperature 98.2 F (36.8 C), temperature source Tympanic, resp. rate 18, weight 175 lb 6.4 oz (79.6 kg), SpO2 99 %.    Lymphatics: No palpable cervical, supraclavicular, axillary or inguinal lymph nodes. Resp: Lungs clear bilaterally. Cardio: Regular rate and rhythm. GI: No hepatosplenomegaly.  Reducible ventral hernia.  No mass. Vascular: No leg edema.   Lab Results:  Lab Results  Component Value Date   WBC 4.3 11/22/2021   HGB 12.4 (L) 11/22/2021   HCT 39.6 11/22/2021   MCV 83.2 11/22/2021   PLT 230 11/22/2021   NEUTROABS 2.5 11/22/2021    Imaging:  No results found.  Medications: I have reviewed the patient's current medications.  Assessment/Plan: Rectal cancer Rectal nodularity noted on colonoscopy 06/29/2020, removal of a rectal "polyp" revealed a tubulovillous adenoma with high-grade dysplasia Physical exam 08/04/2020-perianal mass invading the anal canal and sphincter complex-biopsy positive for mucinous adenocarcinoma CTs 08/17/2020-no rectal mass identified, multiple liver cyst and too small to characterize low-attenuation liver lesions, midline ventral hernia containing proximal right colon, groundglass left upper lobe nodule and nonspecific 3 mm right middle lobe nodule Cycle 1 FOLFOX 09/15/2020 MRI pelvis at [facility] 09/17/2020- anal and low rectal mass.  Internal and external sphincters involved by the mass.  Subtle extension of the tumor through the serosa of the low rectum.  No local regional  adenopathy. MRI liver at [facility] 09/17/2020- scattered hepatic cysts.  No evidence of metastatic disease. Cycle 2 FOLFOX 09/29/2020 Cycle 3 FOLFOX 10/13/2020 Cycle 4 FOLFOX 10/27/2020-Udenyca Cycle 5 FOLFOX 11/19/2020-Udenyca Cycle 6 FOLFOX 12/02/2020-Udenyca Cycle 7 FOLFOX 12/15/2020-Udenyca Cycle 8 FOLFOX 12/29/2020 -oxaliplatin, 5-FU bolus, and Udenyca held Radiation/Xeloda 01/11/2021-02/18/2021 04/08/2021-rectal exam/anoscopy-no residual tumor seen Anal canal biopsies 05/14/2021-fibrosis and reactive changes, negative for dysplasia or malignancy CTs 06/03/2021-no rectal mass, no evidence of metastatic disease, unchanged left upper lobe nodule MRI rectum 07/29/2021-decrease size of anal mass, no rectal component CTs 11/30/2021-no evidence of metastatic disease, improvement in presacral edema, stable subsolid 5 mm left upper lobe nodule, numerous low-attenuation liver lesions stable-minimally enlarged compared to 08/17/2020 favoring benign lesions CEA mildly elevated March 2023 Renal insufficiency 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 coli UTI/bacteremia Oxaliplatin neuropathy, persistent peripheral tingling following cycle 7 FOLFOX, progressive 02/05/2021- Cymbalta started, discontinued due to lack of efficacy, neuropathy improved 05/03/2021, improved 08/26/2021 Right IJ DVT 05/28/2021-apixaban, changed to Coumadin 08/03/2021, Port-A-Cath removed 10/21/2021, Coumadin discontinued 2 weeks after Port-A-Cath removal      Disposition: Walter [name] remains in clinical remission from rectal cancer.  We will follow-up on the CEA from today.  Surveillance CTs in 3 months  We  contacted Dr. Morton Stall office to make sure Walter [name] is up-to-date on surveillance.  He missed an appointment in March.  Walter [name] will contact  Dr. Morton Stall office today to schedule an appointment.  He will return follow-up visit in 3 months.  We are available to see him sooner if needed.    Ned Card ANP/GNP-BC   02/24/2022  9:46 AM

## 2022-03-02 ENCOUNTER — Telehealth: Payer: Self-pay

## 2022-03-02 NOTE — Telephone Encounter (Signed)
-----   Message from Owens Shark, NP sent at 03/02/2022  8:20 AM EDT ----- Please let him know CEA with stable mild elevation.  Follow-up as scheduled.

## 2022-03-02 NOTE — Telephone Encounter (Signed)
Patient gave verbal understanding and had no further questions or concerns  

## 2022-04-11 ENCOUNTER — Other Ambulatory Visit: Payer: Self-pay

## 2022-05-24 ENCOUNTER — Inpatient Hospital Stay: Payer: Medicare Other

## 2022-05-26 ENCOUNTER — Inpatient Hospital Stay: Payer: Medicare Other | Admitting: Oncology

## 2022-05-26 ENCOUNTER — Other Ambulatory Visit: Payer: Medicare Other

## 2022-05-27 ENCOUNTER — Ambulatory Visit (HOSPITAL_BASED_OUTPATIENT_CLINIC_OR_DEPARTMENT_OTHER)
Admission: RE | Admit: 2022-05-27 | Discharge: 2022-05-27 | Disposition: A | Discharge status: Home / Self Care | Payer: Medicare Other | Source: Ambulatory Visit | Attending: Nurse Practitioner | Admitting: Nurse Practitioner

## 2022-05-27 ENCOUNTER — Inpatient Hospital Stay: Payer: Medicare Other | Attending: Oncology

## 2022-05-27 DIAGNOSIS — G4730 Sleep apnea, unspecified: Secondary | ICD-10-CM | POA: Insufficient documentation

## 2022-05-27 DIAGNOSIS — Z8547 Personal history of malignant neoplasm of testis: Secondary | ICD-10-CM | POA: Insufficient documentation

## 2022-05-27 DIAGNOSIS — Z85048 Personal history of other malignant neoplasm of rectum, rectosigmoid junction, and anus: Secondary | ICD-10-CM | POA: Insufficient documentation

## 2022-05-27 DIAGNOSIS — C20 Malignant neoplasm of rectum: Secondary | ICD-10-CM | POA: Diagnosis present

## 2022-05-27 DIAGNOSIS — Z923 Personal history of irradiation: Secondary | ICD-10-CM | POA: Insufficient documentation

## 2022-05-27 DIAGNOSIS — I10 Essential (primary) hypertension: Secondary | ICD-10-CM | POA: Insufficient documentation

## 2022-05-27 DIAGNOSIS — Z9221 Personal history of antineoplastic chemotherapy: Secondary | ICD-10-CM | POA: Insufficient documentation

## 2022-05-27 DIAGNOSIS — G629 Polyneuropathy, unspecified: Secondary | ICD-10-CM | POA: Insufficient documentation

## 2022-05-27 DIAGNOSIS — Z87442 Personal history of urinary calculi: Secondary | ICD-10-CM | POA: Insufficient documentation

## 2022-05-27 LAB — BASIC METABOLIC PANEL - CANCER CENTER ONLY
Anion gap: 9 (ref 5–15)
BUN: 44 mg/dL — ABNORMAL HIGH (ref 8–23)
CO2: 30 mmol/L (ref 22–32)
Calcium: 9.4 mg/dL (ref 8.9–10.3)
Chloride: 104 mmol/L (ref 98–111)
Creatinine: 2.58 mg/dL — ABNORMAL HIGH (ref 0.61–1.24)
GFR, Estimated: 26 mL/min — ABNORMAL LOW (ref >60)
Glucose, Bld: 98 mg/dL (ref 70–99)
Potassium: 4 mmol/L (ref 3.5–5.1)
Sodium: 143 mmol/L (ref 135–145)

## 2022-05-27 LAB — CEA (ACCESS): CEA (CHCC): 4.71 ng/mL (ref 0.00–5.00)

## 2022-05-30 ENCOUNTER — Encounter: Payer: Self-pay | Admitting: Nurse Practitioner

## 2022-05-30 ENCOUNTER — Inpatient Hospital Stay (HOSPITAL_BASED_OUTPATIENT_CLINIC_OR_DEPARTMENT_OTHER): Payer: Medicare Other | Admitting: Nurse Practitioner

## 2022-05-30 VITALS — BP 123/75 | HR 95 | Temp 98.2°F | Resp 18 | Ht 69.0 in | Wt 176.0 lb

## 2022-05-30 DIAGNOSIS — Z923 Personal history of irradiation: Secondary | ICD-10-CM | POA: Diagnosis not present

## 2022-05-30 DIAGNOSIS — C20 Malignant neoplasm of rectum: Secondary | ICD-10-CM

## 2022-05-30 DIAGNOSIS — G629 Polyneuropathy, unspecified: Secondary | ICD-10-CM | POA: Diagnosis not present

## 2022-05-30 DIAGNOSIS — G4730 Sleep apnea, unspecified: Secondary | ICD-10-CM | POA: Diagnosis not present

## 2022-05-30 DIAGNOSIS — Z8547 Personal history of malignant neoplasm of testis: Secondary | ICD-10-CM | POA: Diagnosis not present

## 2022-05-30 DIAGNOSIS — Z87442 Personal history of urinary calculi: Secondary | ICD-10-CM | POA: Diagnosis not present

## 2022-05-30 DIAGNOSIS — Z9221 Personal history of antineoplastic chemotherapy: Secondary | ICD-10-CM | POA: Diagnosis not present

## 2022-05-30 DIAGNOSIS — I10 Essential (primary) hypertension: Secondary | ICD-10-CM | POA: Diagnosis not present

## 2022-05-30 DIAGNOSIS — Z85048 Personal history of other malignant neoplasm of rectum, rectosigmoid junction, and anus: Secondary | ICD-10-CM | POA: Diagnosis present

## 2022-05-30 NOTE — Progress Notes (Signed)
[place] OFFICE PROGRESS NOTE   Diagnosis: Rectal cancer  INTERVAL HISTORY:   Walter [name] returns as scheduled.  Overall feels well.  Bowels moving regularly, no blood or pain with bowel movements.  He has a good appetite.  He is walking 3 to 4 miles 4 days a week.  He notes persistent neuropathy symptoms in his feet, mainly the ball and toes.  No significant associated pain.  Symptoms have improved since completing chemotherapy.  Objective:  Vital signs in last 24 hours:  Blood pressure 123/75, pulse 95, temperature 98.2 F (36.8 C), temperature source Oral, resp. rate 18, height '5\' 9"'$  (1.753 m), weight 176 lb (79.8 kg), SpO2 98 %.    Lymphatics: No palpable cervical, supraclavicular, axillary or inguinal lymph nodes. Resp: Lungs clear bilaterally. Cardio: Regular rate and rhythm. GI: No hepatosplenomegaly. Vascular: No leg edema.   Lab Results:  Lab Results  Component Value Date   WBC 4.3 11/22/2021   HGB 12.4 (L) 11/22/2021   HCT 39.6 11/22/2021   MCV 83.2 11/22/2021   PLT 230 11/22/2021   NEUTROABS 2.5 11/22/2021    Imaging:  No results found.  Medications: I have reviewed the patient's current medications.  Assessment/Plan: Rectal cancer Rectal nodularity noted on colonoscopy 06/29/2020, removal of a rectal "polyp" revealed a tubulovillous adenoma with high-grade dysplasia Physical exam 08/04/2020-perianal mass invading the anal canal and sphincter complex-biopsy positive for mucinous adenocarcinoma CTs 08/17/2020-no rectal mass identified, multiple liver cyst and too small to characterize low-attenuation liver lesions, midline ventral hernia containing proximal right colon, groundglass left upper lobe nodule and nonspecific 3 mm right middle lobe nodule Cycle 1 FOLFOX 09/15/2020 MRI pelvis at [facility] 09/17/2020- anal and low rectal mass.  Internal and external sphincters involved by the mass.  Subtle extension of the tumor through the serosa of  the low rectum.  No local regional adenopathy. MRI liver at [facility] 09/17/2020- scattered hepatic cysts.  No evidence of metastatic disease. Cycle 2 FOLFOX 09/29/2020 Cycle 3 FOLFOX 10/13/2020 Cycle 4 FOLFOX 10/27/2020-Udenyca Cycle 5 FOLFOX 11/19/2020-Udenyca Cycle 6 FOLFOX 12/02/2020-Udenyca Cycle 7 FOLFOX 12/15/2020-Udenyca Cycle 8 FOLFOX 12/29/2020 -oxaliplatin, 5-FU bolus, and Udenyca held Radiation/Xeloda 01/11/2021-02/18/2021 04/08/2021-rectal exam/anoscopy-no residual tumor seen Anal canal biopsies 05/14/2021-fibrosis and reactive changes, negative for dysplasia or malignancy CTs 06/03/2021-no rectal mass, no evidence of metastatic disease, unchanged left upper lobe nodule MRI rectum 07/29/2021-decrease size of anal mass, no rectal component CTs 11/30/2021-no evidence of metastatic disease, improvement in presacral edema, stable subsolid 5 mm left upper lobe nodule, numerous low-attenuation liver lesions stable-minimally enlarged compared to 08/17/2020 favoring benign lesions CEA mildly elevated March 2023 Appointment with Dr. Morton Stall 03/08/2022-digital rectal exam with no mass or nodularity to suggest persistent disease, appears to have a complete clinical response CTs 05/27/2022-no evidence for local recurrence.  No evidence for metastatic disease in the chest, abdomen or pelvis.  Stable tiny 4 mm groundglass opacity in the left upper lobe.  Hepatic and right renal cysts. 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 coli UTI/bacteremia Oxaliplatin neuropathy, persistent peripheral tingling following cycle 7 FOLFOX, progressive 02/05/2021- Cymbalta  started, discontinued due to lack of efficacy, neuropathy improved 05/03/2021, improved 08/26/2021 Right IJ DVT 05/28/2021-apixaban, changed to Coumadin 08/03/2021, Port-A-Cath  removed 10/21/2021, Coumadin discontinued 2 weeks after Port-A-Cath removal    Disposition: Walter [name] remains in clinical remission from rectal cancer.  Recent CEA was in normal range.  Surveillance CT scans with no evidence of metastatic disease.  Next MRI is scheduled at [facility] in November of this year.  He continues follow-up with Dr. Morton Stall.  He will return for a CEA and follow-up visit in 3 months.  We are available to see him sooner if needed.    Ned Card ANP/GNP-BC   05/30/2022  2:51 PM

## 2022-08-29 ENCOUNTER — Inpatient Hospital Stay: Payer: Medicare Other | Attending: Oncology

## 2022-08-29 ENCOUNTER — Inpatient Hospital Stay (HOSPITAL_BASED_OUTPATIENT_CLINIC_OR_DEPARTMENT_OTHER): Payer: Medicare Other | Admitting: Oncology

## 2022-08-29 VITALS — BP 132/74 | HR 75 | Temp 98.2°F | Resp 18 | Ht 69.0 in | Wt 180.0 lb

## 2022-08-29 DIAGNOSIS — G4730 Sleep apnea, unspecified: Secondary | ICD-10-CM | POA: Insufficient documentation

## 2022-08-29 DIAGNOSIS — C20 Malignant neoplasm of rectum: Secondary | ICD-10-CM | POA: Diagnosis not present

## 2022-08-29 DIAGNOSIS — R970 Elevated carcinoembryonic antigen [CEA]: Secondary | ICD-10-CM | POA: Diagnosis not present

## 2022-08-29 DIAGNOSIS — Z87442 Personal history of urinary calculi: Secondary | ICD-10-CM | POA: Diagnosis not present

## 2022-08-29 DIAGNOSIS — N281 Cyst of kidney, acquired: Secondary | ICD-10-CM | POA: Insufficient documentation

## 2022-08-29 DIAGNOSIS — Z85048 Personal history of other malignant neoplasm of rectum, rectosigmoid junction, and anus: Secondary | ICD-10-CM | POA: Insufficient documentation

## 2022-08-29 DIAGNOSIS — Z86711 Personal history of pulmonary embolism: Secondary | ICD-10-CM | POA: Insufficient documentation

## 2022-08-29 DIAGNOSIS — Z923 Personal history of irradiation: Secondary | ICD-10-CM | POA: Diagnosis not present

## 2022-08-29 DIAGNOSIS — I10 Essential (primary) hypertension: Secondary | ICD-10-CM | POA: Insufficient documentation

## 2022-08-29 DIAGNOSIS — Z8547 Personal history of malignant neoplasm of testis: Secondary | ICD-10-CM | POA: Diagnosis not present

## 2022-08-29 LAB — CEA (ACCESS): CEA (CHCC): 5.12 ng/mL — ABNORMAL HIGH (ref 0.00–5.00)

## 2022-08-29 NOTE — Progress Notes (Signed)
[place] OFFICE PROGRESS NOTE   Diagnosis: Rectal cancer  INTERVAL HISTORY:   Walter [name] returns as scheduled.  He feels well.  No rectal bleeding.  Persistent mild neuropathies symptoms in the hands and feet. He saw Dr. Morton Stall for a sigmoidoscopy 08/04/2022 (we cannot access the report today).  Pelvic MRI  Objective:  Vital signs in last 24 hours:  Blood pressure 132/74, pulse 75, temperature 98.2 F (36.8 C), temperature source Oral, resp. rate 18, height '5\' 9"'$  (1.753 m), weight 180 lb (81.6 kg), SpO2 99 %.  Lymphatics: No cervical, supraclavicular, axillary, or inguinal nodes Resp: Lungs clear bilaterally Cardio: Regular rate and rhythm GI: No mass, nontender, no hepatosplenomegaly Vascular: No leg edema    Portacath/PICC-without erythema  Lab Results:  Lab Results  Component Value Date   WBC 4.3 11/22/2021   HGB 12.4 (L) 11/22/2021   HCT 39.6 11/22/2021   MCV 83.2 11/22/2021   PLT 230 11/22/2021   NEUTROABS 2.5 11/22/2021    CMP  Lab Results  Component Value Date   NA 143 05/27/2022   K 4.0 05/27/2022   CL 104 05/27/2022   CO2 30 05/27/2022   GLUCOSE 98 05/27/2022   BUN 44 (H) 05/27/2022   CREATININE 2.58 (H) 05/27/2022   CALCIUM 9.4 05/27/2022   PROT 6.5 03/17/2021   ALBUMIN 3.4 (L) 03/17/2021   AST 14 (L) 03/17/2021   ALT 10 03/17/2021   ALKPHOS 48 03/17/2021   BILITOT 0.3 03/17/2021   GFRNONAA 26 (L) 05/27/2022    Lab Results  Component Value Date   CEA1 5.84 (H) 03/17/2021   CEA 5.12 (H) 08/29/2022    Lab Results  Component Value Date   INR 1.3 (H) 10/21/2021   LABPROT 16.3 (H) 10/21/2021    Imaging:  No results found.  Medications: I have reviewed the patient's current medications.   Assessment/Plan: Rectal cancer Rectal nodularity noted on colonoscopy 06/29/2020, removal of a rectal "polyp" revealed a tubulovillous adenoma with high-grade dysplasia Physical exam 08/04/2020-perianal mass invading the anal  canal and sphincter complex-biopsy positive for mucinous adenocarcinoma CTs 08/17/2020-no rectal mass identified, multiple liver cyst and too small to characterize low-attenuation liver lesions, midline ventral hernia containing proximal right colon, groundglass left upper lobe nodule and nonspecific 3 mm right middle lobe nodule Cycle 1 FOLFOX 09/15/2020 MRI pelvis at [facility] 09/17/2020- anal and low rectal mass.  Internal and external sphincters involved by the mass.  Subtle extension of the tumor through the serosa of the low rectum.  No local regional adenopathy. MRI liver at [facility] 09/17/2020- scattered hepatic cysts.  No evidence of metastatic disease. Cycle 2 FOLFOX 09/29/2020 Cycle 3 FOLFOX 10/13/2020 Cycle 4 FOLFOX 10/27/2020-Udenyca Cycle 5 FOLFOX 11/19/2020-Udenyca Cycle 6 FOLFOX 12/02/2020-Udenyca Cycle 7 FOLFOX 12/15/2020-Udenyca Cycle 8 FOLFOX 12/29/2020 -oxaliplatin, 5-FU bolus, and Udenyca held Radiation/Xeloda 01/11/2021-02/18/2021 04/08/2021-rectal exam/anoscopy-no residual tumor seen Anal canal biopsies 05/14/2021-fibrosis and reactive changes, negative for dysplasia or malignancy CTs 06/03/2021-no rectal mass, no evidence of metastatic disease, unchanged left upper lobe nodule MRI rectum 07/29/2021-decrease size of anal mass, no rectal component CTs 11/30/2021-no evidence of metastatic disease, improvement in presacral edema, stable subsolid 5 mm left upper lobe nodule, numerous low-attenuation liver lesions stable-minimally enlarged compared to 08/17/2020 favoring benign lesions CEA mildly elevated March 2023 Appointment with Dr. Morton Stall 03/08/2022-digital rectal exam with no mass or nodularity to suggest persistent disease, appears to have a complete clinical response CTs 05/27/2022-no evidence for local recurrence.  No evidence for metastatic disease in the chest,  abdomen or pelvis.  Stable tiny 4 mm groundglass opacity in the left upper lobe.  Hepatic and right renal cysts. 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 coli UTI/bacteremia Oxaliplatin neuropathy, persistent peripheral tingling following cycle 7 FOLFOX, progressive 02/05/2021- Cymbalta started, discontinued due to lack of efficacy, neuropathy improved 05/03/2021, improved 08/26/2021 Right IJ DVT 05/28/2021-apixaban, changed to Coumadin 08/03/2021, Port-A-Cath removed 10/21/2021, Coumadin discontinued 2 weeks after Port-A-Cath removal      Disposition: Walter [name] is in clinical remission from rectal cancer.  He has chronic mild elevation of the CEA.  We will follow-up on the recent sigmoidoscopy report from Dr. Morton Stall.  Walter. Kreitzer is scheduled for follow-up at [facility] in February.  He will be scheduled for an office visit and surveillance CTs in 3 months.  Betsy Coder, MD  08/29/2022  11:53 AM

## 2022-10-17 ENCOUNTER — Encounter: Payer: Self-pay | Admitting: Oncology

## 2022-10-24 ENCOUNTER — Ambulatory Visit (HOSPITAL_BASED_OUTPATIENT_CLINIC_OR_DEPARTMENT_OTHER)
Admission: RE | Admit: 2022-10-24 | Discharge: 2022-10-24 | Disposition: A | Discharge status: Home / Self Care | Payer: Medicare Other | Source: Ambulatory Visit | Attending: Oncology | Admitting: Oncology

## 2022-10-24 DIAGNOSIS — C20 Malignant neoplasm of rectum: Secondary | ICD-10-CM | POA: Diagnosis not present

## 2022-10-26 ENCOUNTER — Telehealth: Payer: Self-pay

## 2022-10-26 NOTE — Telephone Encounter (Signed)
-----   Message from Ladell Pier, MD sent at 10/25/2022  8:50 PM EST ----- Please call patient, CTs show no evidence of cancer, f/u as scheduled

## 2022-10-26 NOTE — Telephone Encounter (Signed)
Patient was notify and had no questions or concerns at this time

## 2022-11-21 ENCOUNTER — Encounter: Payer: Self-pay | Admitting: Oncology

## 2022-11-28 ENCOUNTER — Inpatient Hospital Stay: Payer: Medicare Other | Attending: Oncology

## 2022-11-28 DIAGNOSIS — I10 Essential (primary) hypertension: Secondary | ICD-10-CM | POA: Diagnosis not present

## 2022-11-28 DIAGNOSIS — Z87442 Personal history of urinary calculi: Secondary | ICD-10-CM | POA: Diagnosis not present

## 2022-11-28 DIAGNOSIS — G4730 Sleep apnea, unspecified: Secondary | ICD-10-CM | POA: Diagnosis not present

## 2022-11-28 DIAGNOSIS — G620 Drug-induced polyneuropathy: Secondary | ICD-10-CM | POA: Insufficient documentation

## 2022-11-28 DIAGNOSIS — Z85048 Personal history of other malignant neoplasm of rectum, rectosigmoid junction, and anus: Secondary | ICD-10-CM | POA: Diagnosis not present

## 2022-11-28 DIAGNOSIS — C20 Malignant neoplasm of rectum: Secondary | ICD-10-CM

## 2022-11-28 DIAGNOSIS — Z8547 Personal history of malignant neoplasm of testis: Secondary | ICD-10-CM | POA: Insufficient documentation

## 2022-11-28 LAB — CEA (ACCESS): CEA (CHCC): 4.71 ng/mL (ref 0.00–5.00)

## 2022-11-30 ENCOUNTER — Inpatient Hospital Stay: Payer: Medicare Other | Admitting: Oncology

## 2022-11-30 VITALS — BP 165/86 | HR 81 | Temp 98.2°F | Resp 18 | Ht 69.0 in | Wt 179.8 lb

## 2022-11-30 DIAGNOSIS — Z85048 Personal history of other malignant neoplasm of rectum, rectosigmoid junction, and anus: Secondary | ICD-10-CM | POA: Diagnosis not present

## 2022-11-30 DIAGNOSIS — C20 Malignant neoplasm of rectum: Secondary | ICD-10-CM

## 2022-11-30 NOTE — Progress Notes (Signed)
Walter [name] OFFICE PROGRESS NOTE   Diagnosis: Rectal cancer  INTERVAL HISTORY:   Walter [name] returns as scheduled.  He continues to have neuropathy symptoms in the feet greater than the hands.  He has mild orthopnea.  He saw Walter [name] on 11/08/2022 there was no rectal mass on exam.  He reports he is being followed by cardiology for management of hypertension.  Objective:  Vital signs in last 24 hours:  Blood pressure (!) 165/86, pulse 81, temperature 98.2 F (36.8 C), temperature source Oral, resp. rate 18, height '5\' 9"'$  (1.753 m), weight 179 lb 12.8 oz (81.6 kg), SpO2 99 %.    Lymphatics: No cervical, supraclavicular, axillary, or inguinal nodes Resp: Lungs clear bilaterally Cardio: Regular rate and rhythm GI: No mass, no hepatosplenomegaly, nontender Vascular: No leg edema    Lab Results:  Lab Results  Component Value Date   WBC 4.3 11/22/2021   HGB 12.4 (L) 11/22/2021   HCT 39.6 11/22/2021   MCV 83.2 11/22/2021   PLT 230 11/22/2021   NEUTROABS 2.5 11/22/2021    CMP  Lab Results  Component Value Date   NA 143 05/27/2022   K 4.0 05/27/2022   CL 104 05/27/2022   CO2 30 05/27/2022   GLUCOSE 98 05/27/2022   BUN 44 (H) 05/27/2022   CREATININE 2.58 (H) 05/27/2022   CALCIUM 9.4 05/27/2022   PROT 6.5 03/17/2021   ALBUMIN 3.4 (L) 03/17/2021   AST 14 (L) 03/17/2021   ALT 10 03/17/2021   ALKPHOS 48 03/17/2021   BILITOT 0.3 03/17/2021   GFRNONAA 26 (L) 05/27/2022    Lab Results  Component Value Date   CEA1 5.84 (H) 03/17/2021   CEA 4.71 11/28/2022     Medications: I have reviewed the patient's current medications.   Assessment/Plan: Rectal cancer Rectal nodularity noted on colonoscopy 06/29/2020, removal of a rectal "polyp" revealed a tubulovillous adenoma with high-grade dysplasia Physical exam 08/04/2020-perianal mass invading the anal canal and sphincter complex-biopsy positive for mucinous adenocarcinoma CTs 08/17/2020-no rectal mass  identified, multiple liver cyst and too small to characterize low-attenuation liver lesions, midline ventral hernia containing proximal right colon, groundglass left upper lobe nodule and nonspecific 3 mm right middle lobe nodule Cycle 1 FOLFOX 09/15/2020 MRI pelvis at [facility] 09/17/2020- anal and low rectal mass.  Internal and external sphincters involved by the mass.  Subtle extension of the tumor through the serosa of the low rectum.  No local regional adenopathy. MRI liver at [facility] 09/17/2020- scattered hepatic cysts.  No evidence of metastatic disease. Cycle 2 FOLFOX 09/29/2020 Cycle 3 FOLFOX 10/13/2020 Cycle 4 FOLFOX 10/27/2020-Udenyca Cycle 5 FOLFOX 11/19/2020-Udenyca Cycle 6 FOLFOX 12/02/2020-Udenyca Cycle 7 FOLFOX 12/15/2020-Udenyca Cycle 8 FOLFOX 12/29/2020 -oxaliplatin, 5-FU bolus, and Udenyca held Radiation/Xeloda 01/11/2021-02/18/2021 04/08/2021-rectal exam/anoscopy-no residual tumor seen Anal canal biopsies 05/14/2021-fibrosis and reactive changes, negative for dysplasia or malignancy CTs 06/03/2021-no rectal mass, no evidence of metastatic disease, unchanged left upper lobe nodule MRI rectum 07/29/2021-decrease size of anal mass, no rectal component CTs 11/30/2021-no evidence of metastatic disease, improvement in presacral edema, stable subsolid 5 mm left upper lobe nodule, numerous low-attenuation liver lesions stable-minimally enlarged compared to 08/17/2020 favoring benign lesions CEA mildly elevated March 2023 Appointment with Walter [name] 03/08/2022-digital rectal exam with no mass or nodularity to suggest persistent disease, appears to have a complete clinical response CTs 05/27/2022-no evidence for local recurrence.  No evidence for metastatic disease in the chest, abdomen or pelvis.  Stable tiny 4 mm groundglass opacity in the left upper  lobe.  Hepatic and right renal cysts. 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 coli UTI/bacteremia Oxaliplatin neuropathy, persistent peripheral tingling following cycle 7 FOLFOX, progressive 02/05/2021- Cymbalta started, discontinued due to lack of efficacy, neuropathy improved 05/03/2021, improved 08/26/2021 Right IJ DVT 05/28/2021-apixaban, changed to Coumadin 08/03/2021, Port-A-Cath removed 10/21/2021, Coumadin discontinued 2 weeks after Port-A-Cath removal       Disposition: [name] remains in clinical remission from rectal cancer.  The restaging CTs reveal no evidence of disease progression and the CEA is normal.  He will return for an office visit and surveillance CTs in August.  He will continue colonoscopy surveillance with Dr. Gean Birchwood, MD  11/30/2022  9:44 AM

## 2023-04-24 ENCOUNTER — Encounter (HOSPITAL_BASED_OUTPATIENT_CLINIC_OR_DEPARTMENT_OTHER): Payer: Self-pay

## 2023-04-24 ENCOUNTER — Inpatient Hospital Stay: Payer: Medicare Other | Attending: Oncology

## 2023-04-24 ENCOUNTER — Ambulatory Visit (HOSPITAL_BASED_OUTPATIENT_CLINIC_OR_DEPARTMENT_OTHER)
Admission: RE | Admit: 2023-04-24 | Discharge: 2023-04-24 | Disposition: A | Discharge status: Home / Self Care | Payer: Medicare Other | Source: Ambulatory Visit | Attending: Oncology | Admitting: Oncology

## 2023-04-24 DIAGNOSIS — G620 Drug-induced polyneuropathy: Secondary | ICD-10-CM | POA: Insufficient documentation

## 2023-04-24 DIAGNOSIS — C20 Malignant neoplasm of rectum: Secondary | ICD-10-CM | POA: Diagnosis present

## 2023-04-24 DIAGNOSIS — Z923 Personal history of irradiation: Secondary | ICD-10-CM | POA: Insufficient documentation

## 2023-04-24 DIAGNOSIS — G4730 Sleep apnea, unspecified: Secondary | ICD-10-CM | POA: Insufficient documentation

## 2023-04-24 DIAGNOSIS — Z8547 Personal history of malignant neoplasm of testis: Secondary | ICD-10-CM | POA: Insufficient documentation

## 2023-04-24 DIAGNOSIS — N281 Cyst of kidney, acquired: Secondary | ICD-10-CM | POA: Insufficient documentation

## 2023-04-24 DIAGNOSIS — D128 Benign neoplasm of rectum: Secondary | ICD-10-CM | POA: Insufficient documentation

## 2023-04-24 DIAGNOSIS — K429 Umbilical hernia without obstruction or gangrene: Secondary | ICD-10-CM | POA: Insufficient documentation

## 2023-04-24 DIAGNOSIS — I700 Atherosclerosis of aorta: Secondary | ICD-10-CM | POA: Insufficient documentation

## 2023-04-24 DIAGNOSIS — R918 Other nonspecific abnormal finding of lung field: Secondary | ICD-10-CM | POA: Insufficient documentation

## 2023-04-24 DIAGNOSIS — Z85048 Personal history of other malignant neoplasm of rectum, rectosigmoid junction, and anus: Secondary | ICD-10-CM | POA: Insufficient documentation

## 2023-04-24 DIAGNOSIS — I10 Essential (primary) hypertension: Secondary | ICD-10-CM | POA: Insufficient documentation

## 2023-04-24 DIAGNOSIS — Z87442 Personal history of urinary calculi: Secondary | ICD-10-CM | POA: Insufficient documentation

## 2023-04-24 DIAGNOSIS — K7689 Other specified diseases of liver: Secondary | ICD-10-CM | POA: Insufficient documentation

## 2023-04-24 DIAGNOSIS — R197 Diarrhea, unspecified: Secondary | ICD-10-CM | POA: Insufficient documentation

## 2023-04-24 LAB — CEA (ACCESS): CEA (CHCC): 5.04 ng/mL — ABNORMAL HIGH (ref 0.00–5.00)

## 2023-04-26 ENCOUNTER — Inpatient Hospital Stay: Payer: Medicare Other | Admitting: Oncology

## 2023-04-26 VITALS — BP 130/86 | HR 89 | Temp 98.1°F | Resp 20 | Ht 69.0 in | Wt 178.6 lb

## 2023-04-26 DIAGNOSIS — Z85048 Personal history of other malignant neoplasm of rectum, rectosigmoid junction, and anus: Secondary | ICD-10-CM | POA: Diagnosis not present

## 2023-04-26 DIAGNOSIS — R918 Other nonspecific abnormal finding of lung field: Secondary | ICD-10-CM | POA: Diagnosis not present

## 2023-04-26 DIAGNOSIS — Z923 Personal history of irradiation: Secondary | ICD-10-CM | POA: Diagnosis not present

## 2023-04-26 DIAGNOSIS — C20 Malignant neoplasm of rectum: Secondary | ICD-10-CM | POA: Diagnosis not present

## 2023-04-26 DIAGNOSIS — K429 Umbilical hernia without obstruction or gangrene: Secondary | ICD-10-CM | POA: Diagnosis not present

## 2023-04-26 DIAGNOSIS — D128 Benign neoplasm of rectum: Secondary | ICD-10-CM | POA: Diagnosis not present

## 2023-04-26 DIAGNOSIS — R197 Diarrhea, unspecified: Secondary | ICD-10-CM | POA: Diagnosis not present

## 2023-04-26 DIAGNOSIS — I700 Atherosclerosis of aorta: Secondary | ICD-10-CM | POA: Diagnosis not present

## 2023-04-26 DIAGNOSIS — Z87442 Personal history of urinary calculi: Secondary | ICD-10-CM | POA: Diagnosis not present

## 2023-04-26 DIAGNOSIS — G4730 Sleep apnea, unspecified: Secondary | ICD-10-CM | POA: Diagnosis not present

## 2023-04-26 DIAGNOSIS — K7689 Other specified diseases of liver: Secondary | ICD-10-CM | POA: Diagnosis not present

## 2023-04-26 DIAGNOSIS — I10 Essential (primary) hypertension: Secondary | ICD-10-CM | POA: Diagnosis not present

## 2023-04-26 DIAGNOSIS — N281 Cyst of kidney, acquired: Secondary | ICD-10-CM | POA: Diagnosis not present

## 2023-04-26 DIAGNOSIS — Z8547 Personal history of malignant neoplasm of testis: Secondary | ICD-10-CM | POA: Diagnosis not present

## 2023-04-26 DIAGNOSIS — G620 Drug-induced polyneuropathy: Secondary | ICD-10-CM | POA: Diagnosis not present

## 2023-04-26 NOTE — Progress Notes (Signed)
[facility] OFFICE PROGRESS NOTE   Diagnosis: Rectal cancer  INTERVAL HISTORY:   Walter [name] returns as scheduled.  He feels well.  He walks 3 miles daily.  He has frequent loose bowel movements.  This has been the pattern since he started treatment for rectal cancer.  No bleeding.  He reports an occasional sharp discomfort at the "anus ".  He has urinary frequency.  He has noted a fullness in the right submandibular region.  Objective:  Vital signs in last 24 hours:  Blood pressure 130/86, pulse 89, temperature 98.1 F (36.7 C), temperature source Oral, resp. rate 20, height 5\' 9"  (1.753 m), weight 178 lb 9.6 oz (81 kg), SpO2 99%.    HEENT: Oropharynx without visible mass, slight prominence of the right compared to the left submandibular gland. Lymphatics: No cervical, supraclavicular, axillary, or inguinal nodes Resp: Lungs clear bilaterally Cardio: Regular rate and rhythm GI: Nontender, no mass, no hepatosplenomegaly Vascular: No leg edema, the left lower leg is slightly larger than the right side   Lab Results:  Lab Results  Component Value Date   WBC 4.3 11/22/2021   HGB 12.4 (L) 11/22/2021   HCT 39.6 11/22/2021   MCV 83.2 11/22/2021   PLT 230 11/22/2021   NEUTROABS 2.5 11/22/2021    CMP  Lab Results  Component Value Date   NA 143 05/27/2022   K 4.0 05/27/2022   CL 104 05/27/2022   CO2 30 05/27/2022   GLUCOSE 98 05/27/2022   BUN 44 (H) 05/27/2022   CREATININE 2.58 (H) 05/27/2022   CALCIUM 9.4 05/27/2022   PROT 6.5 03/17/2021   ALBUMIN 3.4 (L) 03/17/2021   AST 14 (L) 03/17/2021   ALT 10 03/17/2021   ALKPHOS 48 03/17/2021   BILITOT 0.3 03/17/2021   GFRNONAA 26 (L) 05/27/2022    Lab Results  Component Value Date   CEA1 5.84 (H) 03/17/2021   CEA 5.04 (H) 04/24/2023    Imaging:  CT CHEST ABDOMEN PELVIS WO CONTRAST  Result Date: 04/24/2023 CLINICAL DATA:  Rectal cancer restaging.  * Tracking Code: BO * EXAM: CT CHEST, ABDOMEN AND  PELVIS WITHOUT CONTRAST TECHNIQUE: Multidetector CT imaging of the chest, abdomen and pelvis was performed following the standard protocol without IV contrast. RADIATION DOSE REDUCTION: This exam was performed according to the departmental dose-optimization program which includes automated exposure control, adjustment of the mA and/or kV according to patient size and/or use of iterative reconstruction technique. COMPARISON:  10/24/2022 FINDINGS: CT CHEST FINDINGS Cardiovascular: The heart size is normal. No substantial pericardial effusion. Mild atherosclerotic calcification is noted in the wall of the thoracic aorta. Mediastinum/Nodes: No mediastinal lymphadenopathy. No evidence for gross hilar lymphadenopathy although assessment is limited by the lack of intravenous contrast on the current study. The esophagus has normal imaging features. There is no axillary lymphadenopathy. Lungs/Pleura: 2 mm right middle lobe nodule on 126/4 is unchanged. 4 mm ground-glass opacity in the central left upper lobe on 42/4 is unchanged. Paraspinal right lower lobe atelectasis noted. No suspicious pulmonary nodule or mass. Asymmetric elevation left hemidiaphragm again noted. Musculoskeletal: No worrisome lytic or sclerotic osseous abnormality. CT ABDOMEN PELVIS FINDINGS Hepatobiliary: Multiple hepatic cysts again noted. No followup imaging is recommended. No new suspicious liver abnormality on today's study although lack of intravenous contrast limits assessment. There is no evidence for gallstones, gallbladder wall thickening, or pericholecystic fluid. No intrahepatic or extrahepatic biliary dilation. Pancreas: No focal mass lesion. No dilatation of the main duct. No intraparenchymal cyst. No  peripancreatic edema. Spleen: No splenomegaly. No suspicious focal mass lesion. Adrenals/Urinary Tract: No adrenal nodule or mass. Left kidney atrophic with stable upper pole simple cyst. Stable cyst upper pole right kidney. No followup  imaging is recommended. No evidence for hydroureter. The urinary bladder appears normal for the degree of distention. Stomach/Bowel: Stomach is unremarkable. No gastric wall thickening. No evidence of outlet obstruction. Duodenum is normally positioned as is the ligament of Treitz. No small bowel wall thickening. No small bowel dilatation. The terminal ileum is normal. The appendix is normal. No gross colonic mass. No colonic wall thickening. No discrete rectal mass evident. Presacral soft tissue density is stable, likely treatment related. Vascular/Lymphatic: There is mild atherosclerotic calcification of the abdominal aorta without aneurysm. There is no gastrohepatic or hepatoduodenal ligament lymphadenopathy. No retroperitoneal or mesenteric lymphadenopathy. No pelvic sidewall lymphadenopathy. Reproductive: The prostate gland and seminal vesicles are unremarkable. Other: No intraperitoneal free fluid. Musculoskeletal: Umbilical hernia contains the cecum as on prior study. No complicating features. Defect in the left lateral abdominal wall is similar with herniation of intraabdominal fat into the plane between the internal and external oblique muscles. No worrisome lytic or sclerotic osseous abnormality. IMPRESSION: 1. Stable exam. No new or progressive findings to suggest recurrent or metastatic disease in the chest, abdomen, or pelvis. 2. Stable presacral soft tissue density, likely treatment related. 3. Umbilical hernia contains the cecum without complicating features. 4. Tiny bilateral pulmonary nodules stable in the interval. Continued attention on follow-up suggested. 5.  Aortic Atherosclerosis (ICD10-I70.0). Electronically Signed   By: Kennith Center M.D.   On: 04/24/2023 12:25    Medications: I have reviewed the patient's current medications.   Assessment/Plan: Rectal cancer Rectal nodularity noted on colonoscopy 06/29/2020, removal of a rectal "polyp" revealed a tubulovillous adenoma with high-grade  dysplasia Physical exam 08/04/2020-perianal mass invading the anal canal and sphincter complex-biopsy positive for mucinous adenocarcinoma CTs 08/17/2020-no rectal mass identified, multiple liver cyst and too small to characterize low-attenuation liver lesions, midline ventral hernia containing proximal right colon, groundglass left upper lobe nodule and nonspecific 3 mm right middle lobe nodule Cycle 1 FOLFOX 09/15/2020 MRI pelvis at [facility] 09/17/2020- anal and low rectal mass.  Internal and external sphincters involved by the mass.  Subtle extension of the tumor through the serosa of the low rectum.  No local regional adenopathy. MRI liver at [facility] 09/17/2020- scattered hepatic cysts.  No evidence of metastatic disease. Cycle 2 FOLFOX 09/29/2020 Cycle 3 FOLFOX 10/13/2020 Cycle 4 FOLFOX 10/27/2020-Udenyca Cycle 5 FOLFOX 11/19/2020-Udenyca Cycle 6 FOLFOX 12/02/2020-Udenyca Cycle 7 FOLFOX 12/15/2020-Udenyca Cycle 8 FOLFOX 12/29/2020 -oxaliplatin, 5-FU bolus, and Udenyca held Radiation/Xeloda 01/11/2021-02/18/2021 04/08/2021-rectal exam/anoscopy-no residual tumor seen Anal canal biopsies 05/14/2021-fibrosis and reactive changes, negative for dysplasia or malignancy CTs 06/03/2021-no rectal mass, no evidence of metastatic disease, unchanged left upper lobe nodule MRI rectum 07/29/2021-decrease size of anal mass, no rectal component CTs 11/30/2021-no evidence of metastatic disease, improvement in presacral edema, stable subsolid 5 mm left upper lobe nodule, numerous low-attenuation liver lesions stable-minimally enlarged compared to 08/17/2020 favoring benign lesions CEA mildly elevated March 2023 Appointment with Dr. Byrd Hesselbach 03/08/2022-digital rectal exam with no mass or nodularity to suggest persistent disease, appears to have a complete clinical response CTs 05/27/2022-no evidence for local recurrence.  No evidence for metastatic disease in the chest, abdomen or pelvis.  Stable tiny 4 mm groundglass opacity in  the left upper lobe.  Hepatic and right renal cysts. 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 coli UTI/bacteremia Oxaliplatin neuropathy, persistent peripheral tingling following cycle 7 FOLFOX, progressive 02/05/2021- Cymbalta started, discontinued due to lack of efficacy, neuropathy improved 05/03/2021, improved 08/26/2021 Right IJ DVT 05/28/2021-apixaban, changed to Coumadin 08/03/2021, Port-A-Cath removed 10/21/2021, Coumadin discontinued 2 weeks after Port-A-Cath removal        Disposition: Mr. Paganini remains in clinical remission from rectal cancer.  The CEA is at the upper end of the normal range and stable.  He is scheduled to be seen by Dr. Byrd Hesselbach in February 2025.  He will return for an office visit and CEA in 5 months.  The prominence of the right submandibular gland is likely benign finding.  He will call if this area changes.  He will call for increased rectal pain.    Walter Papas, MD  04/26/2023  9:36 AM

## 2023-07-03 ENCOUNTER — Encounter: Payer: Self-pay | Admitting: Oncology

## 2023-07-03 NOTE — Telephone Encounter (Signed)
error 

## 2023-08-09 ENCOUNTER — Encounter: Payer: Self-pay | Admitting: Oncology

## 2023-08-09 NOTE — Telephone Encounter (Signed)
Telephone call  

## 2023-09-21 ENCOUNTER — Inpatient Hospital Stay: Payer: Medicare Other | Admitting: Oncology

## 2023-09-21 ENCOUNTER — Other Ambulatory Visit: Payer: Medicare Other

## 2023-09-21 ENCOUNTER — Inpatient Hospital Stay: Payer: Medicare Other

## 2023-10-02 ENCOUNTER — Inpatient Hospital Stay: Payer: Medicare Other | Attending: Oncology

## 2023-10-02 ENCOUNTER — Inpatient Hospital Stay: Payer: Medicare Other | Admitting: Oncology

## 2023-10-02 VITALS — BP 127/80 | HR 85 | Temp 97.6°F | Resp 18 | Ht 69.0 in | Wt 180.0 lb

## 2023-10-02 DIAGNOSIS — N2889 Other specified disorders of kidney and ureter: Secondary | ICD-10-CM | POA: Insufficient documentation

## 2023-10-02 DIAGNOSIS — Z8547 Personal history of malignant neoplasm of testis: Secondary | ICD-10-CM | POA: Diagnosis not present

## 2023-10-02 DIAGNOSIS — Z87442 Personal history of urinary calculi: Secondary | ICD-10-CM | POA: Insufficient documentation

## 2023-10-02 DIAGNOSIS — Z860101 Personal history of adenomatous and serrated colon polyps: Secondary | ICD-10-CM | POA: Insufficient documentation

## 2023-10-02 DIAGNOSIS — I10 Essential (primary) hypertension: Secondary | ICD-10-CM | POA: Insufficient documentation

## 2023-10-02 DIAGNOSIS — G620 Drug-induced polyneuropathy: Secondary | ICD-10-CM | POA: Diagnosis not present

## 2023-10-02 DIAGNOSIS — G4730 Sleep apnea, unspecified: Secondary | ICD-10-CM | POA: Diagnosis not present

## 2023-10-02 DIAGNOSIS — C20 Malignant neoplasm of rectum: Secondary | ICD-10-CM

## 2023-10-02 DIAGNOSIS — Z86718 Personal history of other venous thrombosis and embolism: Secondary | ICD-10-CM | POA: Diagnosis not present

## 2023-10-02 DIAGNOSIS — Z85048 Personal history of other malignant neoplasm of rectum, rectosigmoid junction, and anus: Secondary | ICD-10-CM | POA: Insufficient documentation

## 2023-10-02 LAB — CEA (ACCESS): CEA (CHCC): 4.75 ng/mL (ref 0.00–5.00)

## 2023-10-02 NOTE — Progress Notes (Signed)
 Walter [name]   Diagnosis: Rectal cancer  INTERVAL HISTORY:   Walter [name] returns as scheduled.  He feels well.  He developed acute left lower abdominal pain 09/12/2023 and was diagnosed with an incarcerated left abdominal hernia.  He underwent surgical repair.  He reports an occasional small amount of blood on the toilet tissue when wiping following a bowel movement.  This has been present intermittently since undergoing rectal surgery.  Objective:  Vital signs in last 24 hours:  Blood pressure 127/80, pulse 85, temperature 97.6 F (36.4 C), temperature source Oral, resp. rate 18, height 5' 9 (1.753 m), weight 180 lb (81.6 kg), SpO2 100%.   HEENT: Slight prominence of the right compared to the left submandibular gland Lymphatics: No cervical, supraclavicular, axillary, or inguinal nodes Resp: Lungs clear bilaterally Cardio: Heart sounds, regular rhythm GI: No mass, firm fullness surrounding the left lateral abdominal scar.  No hepatosplenomegaly Vascular: No leg edema Rectal: Exam externally into the anal verge reveals radiation hyperpigmentation/hypopigmentation, small areas of superficial ulceration at the anal verge   Lab Results:  Lab Results  Component Value Date   WBC 4.3 11/22/2021   HGB 12.4 (L) 11/22/2021   HCT 39.6 11/22/2021   MCV 83.2 11/22/2021   PLT 230 11/22/2021   NEUTROABS 2.5 11/22/2021    CMP  Lab Results  Component Value Date   NA 143 05/27/2022   K 4.0 05/27/2022   CL 104 05/27/2022   CO2 30 05/27/2022   GLUCOSE 98 05/27/2022   BUN 44 (H) 05/27/2022   CREATININE 2.58 (H) 05/27/2022   CALCIUM  9.4 05/27/2022   PROT 6.5 03/17/2021   ALBUMIN 3.4 (L) 03/17/2021   AST 14 (L) 03/17/2021   ALT 10 03/17/2021   ALKPHOS 48 03/17/2021   BILITOT 0.3 03/17/2021   GFRNONAA 26 (L) 05/27/2022    Lab Results  Component Value Date   CEA1 5.84 (H) 03/17/2021   CEA 5.04 (H) 04/24/2023    Lab Results  Component Value  Date   INR 1.3 (H) 10/21/2021   LABPROT 16.3 (H) 10/21/2021    Imaging:  No results found.  Medications: I have reviewed the patient's current medications.   Assessment/Plan: Rectal cancer Rectal nodularity noted on colonoscopy 06/29/2020, removal of a rectal polyp revealed a tubulovillous adenoma with high-grade dysplasia Physical exam 08/04/2020-perianal mass invading the anal canal and sphincter complex-biopsy positive for mucinous adenocarcinoma CTs 08/17/2020-no rectal mass identified, multiple liver cyst and too small to characterize low-attenuation liver lesions, midline ventral hernia containing proximal right colon, groundglass left upper lobe nodule and nonspecific 3 mm right middle lobe nodule Cycle 1 FOLFOX 09/15/2020 MRI pelvis at [facility] 09/17/2020- anal and low rectal mass.  Internal and external sphincters involved by the mass.  Subtle extension of the tumor through the serosa of the low rectum.  No local regional adenopathy. MRI liver at [facility] 09/17/2020- scattered hepatic cysts.  No evidence of metastatic disease. Cycle 2 FOLFOX 09/29/2020 Cycle 3 FOLFOX 10/13/2020 Cycle 4 FOLFOX 10/27/2020-Udenyca  Cycle 5 FOLFOX 11/19/2020-Udenyca  Cycle 6 FOLFOX 12/02/2020-Udenyca  Cycle 7 FOLFOX 12/15/2020-Udenyca  Cycle 8 FOLFOX 12/29/2020 -oxaliplatin , 5-FU bolus, and Udenyca  held Radiation/Xeloda  01/11/2021-02/18/2021 04/08/2021-rectal exam/anoscopy-no residual tumor seen Anal canal biopsies 05/14/2021-fibrosis and reactive changes, negative for dysplasia or malignancy CTs 06/03/2021-no rectal mass, no evidence of metastatic disease, unchanged left upper lobe nodule MRI rectum 07/29/2021-decrease size of anal mass, no rectal component CTs 11/30/2021-no evidence of metastatic disease, improvement in presacral edema, stable subsolid 5 mm left upper lobe  nodule, numerous low-attenuation liver lesions stable-minimally enlarged compared to 08/17/2020 favoring benign lesions CEA mildly elevated  March 2023 Appointment with Dr. Darral 03/08/2022-digital rectal exam with no mass or nodularity to suggest persistent disease, appears to have a complete clinical response CTs 05/27/2022-no evidence for local recurrence.  No evidence for metastatic disease in the chest, abdomen or pelvis.  Stable tiny 4 mm groundglass opacity in the left upper lobe.  Hepatic and right renal cysts. 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 coli UTI/bacteremia Oxaliplatin  neuropathy, persistent peripheral tingling following cycle 7 FOLFOX, progressive 02/05/2021- Cymbalta  started, discontinued due to lack of efficacy, neuropathy improved 05/03/2021, improved 08/26/2021 Right IJ DVT 05/28/2021-apixaban , changed to Coumadin  08/03/2021, Port-A-Cath removed 10/21/2021, Coumadin  discontinued 2 weeks after Port-A-Cath removal      Disposition: Walter [name] is in clinical remission from rectal cancer.  He will return for an office visit and CEA in 6 months.  He will be due for a surveillance colonoscopy with Dr. Dorsey later this year.  I recommended he follow-up with Dr. Tamela for persistence of rectal bleeding.  He is scheduled to see Dr.  Darral next month.  The mild intermittent rectal bleeding is likely related to radiation change/ulceration at the anal verge.  Walter Hof, MD  10/02/2023  8:52 AM

## 2024-04-01 ENCOUNTER — Ambulatory Visit: Payer: Medicare Other | Admitting: Oncology

## 2024-04-01 ENCOUNTER — Other Ambulatory Visit: Payer: Medicare Other

## 2024-05-03 ENCOUNTER — Ambulatory Visit: Payer: Self-pay | Admitting: Oncology

## 2024-05-03 ENCOUNTER — Inpatient Hospital Stay: Admitting: Oncology

## 2024-05-03 ENCOUNTER — Inpatient Hospital Stay: Attending: Oncology

## 2024-05-03 ENCOUNTER — Other Ambulatory Visit: Payer: Self-pay | Admitting: *Deleted

## 2024-05-03 VITALS — BP 147/88 | HR 80 | Temp 98.3°F | Resp 18 | Ht 69.0 in | Wt 181.6 lb

## 2024-05-03 DIAGNOSIS — Z87442 Personal history of urinary calculi: Secondary | ICD-10-CM | POA: Insufficient documentation

## 2024-05-03 DIAGNOSIS — G4730 Sleep apnea, unspecified: Secondary | ICD-10-CM | POA: Diagnosis not present

## 2024-05-03 DIAGNOSIS — Z85048 Personal history of other malignant neoplasm of rectum, rectosigmoid junction, and anus: Secondary | ICD-10-CM | POA: Diagnosis present

## 2024-05-03 DIAGNOSIS — K7689 Other specified diseases of liver: Secondary | ICD-10-CM | POA: Diagnosis not present

## 2024-05-03 DIAGNOSIS — G620 Drug-induced polyneuropathy: Secondary | ICD-10-CM | POA: Diagnosis not present

## 2024-05-03 DIAGNOSIS — Z923 Personal history of irradiation: Secondary | ICD-10-CM | POA: Diagnosis not present

## 2024-05-03 DIAGNOSIS — Z9221 Personal history of antineoplastic chemotherapy: Secondary | ICD-10-CM | POA: Diagnosis not present

## 2024-05-03 DIAGNOSIS — I10 Essential (primary) hypertension: Secondary | ICD-10-CM | POA: Insufficient documentation

## 2024-05-03 DIAGNOSIS — C20 Malignant neoplasm of rectum: Secondary | ICD-10-CM

## 2024-05-03 DIAGNOSIS — N281 Cyst of kidney, acquired: Secondary | ICD-10-CM | POA: Insufficient documentation

## 2024-05-03 DIAGNOSIS — L84 Corns and callosities: Secondary | ICD-10-CM | POA: Diagnosis not present

## 2024-05-03 DIAGNOSIS — Z8547 Personal history of malignant neoplasm of testis: Secondary | ICD-10-CM | POA: Insufficient documentation

## 2024-05-03 LAB — CEA (ACCESS): CEA (CHCC): 4.76 ng/mL (ref 0.00–5.00)

## 2024-05-03 NOTE — Progress Notes (Signed)
 [facility] OFFICE PROGRESS NOTE   Diagnosis: Rectal cancer  INTERVAL HISTORY:   Walter [name] returns as scheduled.  He feels well.  He has a painful callus at the plantar surface of the lateral left foot.  He plans to see a podiatrist.  He has intermittent rectal bleeding when he wipes.  He saw Dr. Darral 11/07/2023 and had a negative rectal exam.  He is due for a surveillance sigmoidoscopy or colonoscopy.  He plans to move to [place]  in October.  Objective:  Vital signs in last 24 hours:  Blood pressure (!) 147/88, pulse 80, temperature 98.3 F (36.8 C), resp. rate 18, height 5' 9 (1.753 m), weight 181 lb 9.6 oz (82.4 kg), SpO2 98%.    Lymphatics: No cervical, supraclavicular, axillary, or inguinal nodes Resp: Lungs clear bilaterally Cardio: Regular rate and rhythm GI: No hepatosplenomegaly, no mass, nontender Vascular: The left lower leg is larger than the right side  Skin: Callus at the plantar surface of the left fifth MTP   Lab Results:  Lab Results  Component Value Date   WBC 4.3 11/22/2021   HGB 12.4 (L) 11/22/2021   HCT 39.6 11/22/2021   MCV 83.2 11/22/2021   PLT 230 11/22/2021   NEUTROABS 2.5 11/22/2021    CMP  Lab Results  Component Value Date   NA 143 05/27/2022   K 4.0 05/27/2022   CL 104 05/27/2022   CO2 30 05/27/2022   GLUCOSE 98 05/27/2022   BUN 44 (H) 05/27/2022   CREATININE 2.58 (H) 05/27/2022   CALCIUM  9.4 05/27/2022   PROT 6.5 03/17/2021   ALBUMIN 3.4 (L) 03/17/2021   AST 14 (L) 03/17/2021   ALT 10 03/17/2021   ALKPHOS 48 03/17/2021   BILITOT 0.3 03/17/2021   GFRNONAA 26 (L) 05/27/2022    Lab Results  Component Value Date   CEA1 5.84 (H) 03/17/2021   CEA 4.76 05/03/2024    Lab Results  Component Value Date   INR 1.3 (H) 10/21/2021   LABPROT 16.3 (H) 10/21/2021    Medications: I have reviewed the patient's current medications.   Assessment/Plan:  Rectal cancer Rectal nodularity noted on colonoscopy 06/29/2020,  removal of a rectal polyp revealed a tubulovillous adenoma with high-grade dysplasia Physical exam 08/04/2020-perianal mass invading the anal canal and sphincter complex-biopsy positive for mucinous adenocarcinoma CTs 08/17/2020-no rectal mass identified, multiple liver cyst and too small to characterize low-attenuation liver lesions, midline ventral hernia containing proximal right colon, groundglass left upper lobe nodule and nonspecific 3 mm right middle lobe nodule Cycle 1 FOLFOX 09/15/2020 MRI pelvis at [facility] 09/17/2020- anal and low rectal mass.  Internal and external sphincters involved by the mass.  Subtle extension of the tumor through the serosa of the low rectum.  No local regional adenopathy. MRI liver at [facility] 09/17/2020- scattered hepatic cysts.  No evidence of metastatic disease. Cycle 2 FOLFOX 09/29/2020 Cycle 3 FOLFOX 10/13/2020 Cycle 4 FOLFOX 10/27/2020-Udenyca  Cycle 5 FOLFOX 11/19/2020-Udenyca  Cycle 6 FOLFOX 12/02/2020-Udenyca  Cycle 7 FOLFOX 12/15/2020-Udenyca  Cycle 8 FOLFOX 12/29/2020 -oxaliplatin , 5-FU bolus, and Udenyca  held Radiation/Xeloda  01/11/2021-02/18/2021 04/08/2021-rectal exam/anoscopy-no residual tumor seen Anal canal biopsies 05/14/2021-fibrosis and reactive changes, negative for dysplasia or malignancy CTs 06/03/2021-no rectal mass, no evidence of metastatic disease, unchanged left upper lobe nodule MRI rectum 07/29/2021-decrease size of anal mass, no rectal component CTs 11/30/2021-no evidence of metastatic disease, improvement in presacral edema, stable subsolid 5 mm left upper lobe nodule, numerous low-attenuation liver lesions stable-minimally enlarged compared to 08/17/2020 favoring benign lesions CEA mildly  elevated March 2023 Appointment with Dr. Darral 03/08/2022-digital rectal exam with no mass or nodularity to suggest persistent disease, appears to have a complete clinical response CTs 05/27/2022-no evidence for local recurrence.  No evidence for metastatic disease  in the chest, abdomen or pelvis.  Stable tiny 4 mm groundglass opacity in the left upper lobe.  Hepatic and right renal cysts. 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 coli UTI/bacteremia Oxaliplatin  neuropathy, persistent peripheral tingling following cycle 7 FOLFOX, progressive 02/05/2021- Cymbalta  started, discontinued due to lack of efficacy, neuropathy improved 05/03/2021, improved 08/26/2021 Right IJ DVT 05/28/2021-apixaban , changed to Coumadin  08/03/2021, Port-A-Cath removed 10/21/2021, Coumadin  discontinued 2 weeks after Port-A-Cath removal     Disposition: Walter [name] is in clinical remission from rectal cancer.  We will refer him to Dr. Dorsey for a surveillance sigmoidoscopy or colonoscopy.  He will be scheduled for surveillance CTs within the next few weeks.  He will return for an office visit and CEA in 6 months. He plans to see the podiatrist for treatment of the painful callus at the plantar surface of the left foot.  Arley Hof, MD  05/03/2024  1:32 PM

## 2024-05-03 NOTE — Progress Notes (Signed)
 Faxed referral order and last office note to Dr. Tamela Maudlin for surveillance colonoscopy due 08/2024
